# Patient Record
Sex: Male | Born: 1950 | Race: White | Hispanic: No | Marital: Single | Smoking: Never smoker
Health system: Southern US, Community
[De-identification: ages and names within clinical notes are randomized; demographics above are authoritative.]

## PROBLEM LIST (undated history)

## (undated) DIAGNOSIS — D569 Thalassemia, unspecified: Secondary | ICD-10-CM

## (undated) DIAGNOSIS — T7840XA Allergy, unspecified, initial encounter: Secondary | ICD-10-CM

## (undated) DIAGNOSIS — R262 Difficulty in walking, not elsewhere classified: Secondary | ICD-10-CM

## (undated) DIAGNOSIS — M549 Dorsalgia, unspecified: Secondary | ICD-10-CM

## (undated) DIAGNOSIS — R569 Unspecified convulsions: Secondary | ICD-10-CM

## (undated) DIAGNOSIS — K219 Gastro-esophageal reflux disease without esophagitis: Secondary | ICD-10-CM

## (undated) DIAGNOSIS — I69351 Hemiplegia and hemiparesis following cerebral infarction affecting right dominant side: Secondary | ICD-10-CM

## (undated) DIAGNOSIS — E079 Disorder of thyroid, unspecified: Secondary | ICD-10-CM

## (undated) DIAGNOSIS — E039 Hypothyroidism, unspecified: Secondary | ICD-10-CM

## (undated) DIAGNOSIS — D561 Beta thalassemia: Secondary | ICD-10-CM

## (undated) DIAGNOSIS — IMO0001 Reserved for inherently not codable concepts without codable children: Secondary | ICD-10-CM

## (undated) DIAGNOSIS — I639 Cerebral infarction, unspecified: Secondary | ICD-10-CM

## (undated) DIAGNOSIS — I10 Essential (primary) hypertension: Secondary | ICD-10-CM

## (undated) DIAGNOSIS — R296 Repeated falls: Secondary | ICD-10-CM

## (undated) DIAGNOSIS — M1990 Unspecified osteoarthritis, unspecified site: Secondary | ICD-10-CM

## (undated) DIAGNOSIS — H269 Unspecified cataract: Secondary | ICD-10-CM

## (undated) DIAGNOSIS — Z860101 Personal history of adenomatous and serrated colon polyps: Secondary | ICD-10-CM

## (undated) DIAGNOSIS — IMO0002 Reserved for concepts with insufficient information to code with codable children: Secondary | ICD-10-CM

## (undated) DIAGNOSIS — L4050 Arthropathic psoriasis, unspecified: Secondary | ICD-10-CM

## (undated) DIAGNOSIS — L409 Psoriasis, unspecified: Secondary | ICD-10-CM

## (undated) DIAGNOSIS — K5792 Diverticulitis of intestine, part unspecified, without perforation or abscess without bleeding: Secondary | ICD-10-CM

## (undated) DIAGNOSIS — E781 Pure hyperglyceridemia: Secondary | ICD-10-CM

## (undated) DIAGNOSIS — M5412 Radiculopathy, cervical region: Secondary | ICD-10-CM

## (undated) DIAGNOSIS — Z85828 Personal history of other malignant neoplasm of skin: Secondary | ICD-10-CM

## (undated) HISTORY — PX: CORRECTION HAMMER TOE: SUR317

## (undated) HISTORY — DX: Allergy, unspecified, initial encounter: T78.40XA

## (undated) HISTORY — PX: FRACTURE SURGERY: SHX138

## (undated) HISTORY — DX: Gastro-esophageal reflux disease without esophagitis: K21.9

## (undated) HISTORY — DX: Dorsalgia, unspecified: M54.9

## (undated) HISTORY — DX: Cerebral infarction, unspecified: I63.9

## (undated) HISTORY — PX: JOINT REPLACEMENT: SHX530

## (undated) HISTORY — PX: HIP FRACTURE SURGERY: SHX118

## (undated) HISTORY — PX: KNEE ARTHROPLASTY: SHX992

## (undated) HISTORY — DX: Unspecified osteoarthritis, unspecified site: M19.90

## (undated) HISTORY — PX: SPINE SURGERY: SHX786

## (undated) HISTORY — DX: Reserved for concepts with insufficient information to code with codable children: IMO0002

## (undated) HISTORY — DX: Reserved for inherently not codable concepts without codable children: IMO0001

## (undated) HISTORY — PX: CERVICAL FUSION: SHX112

## (undated) HISTORY — DX: Disorder of thyroid, unspecified: E07.9

## (undated) HISTORY — PX: EYE SURGERY: SHX253

## (undated) HISTORY — DX: Unspecified cataract: H26.9

## (undated) HISTORY — PX: BACK SURGERY: SHX140

## (undated) HISTORY — DX: Essential (primary) hypertension: I10

---

## 2004-10-26 ENCOUNTER — Ambulatory Visit: Payer: Self-pay | Admitting: Rheumatology

## 2004-10-28 ENCOUNTER — Encounter: Payer: Self-pay | Admitting: Rheumatology

## 2005-02-03 ENCOUNTER — Encounter: Payer: Self-pay | Admitting: Rheumatology

## 2005-02-25 ENCOUNTER — Ambulatory Visit: Payer: Self-pay | Admitting: General Practice

## 2006-05-24 ENCOUNTER — Ambulatory Visit: Payer: Self-pay | Admitting: General Practice

## 2006-07-04 ENCOUNTER — Ambulatory Visit: Payer: Self-pay | Admitting: General Practice

## 2008-05-31 ENCOUNTER — Ambulatory Visit: Payer: Self-pay | Admitting: Unknown Physician Specialty

## 2008-12-25 ENCOUNTER — Ambulatory Visit: Payer: Self-pay | Admitting: Family Medicine

## 2009-05-12 ENCOUNTER — Ambulatory Visit: Payer: Self-pay | Admitting: General Practice

## 2009-06-04 ENCOUNTER — Inpatient Hospital Stay (HOSPITAL_COMMUNITY): Admission: RE | Admit: 2009-06-04 | Discharge: 2009-06-06 | Payer: Self-pay | Admitting: Neurosurgery

## 2010-06-10 ENCOUNTER — Ambulatory Visit: Payer: Self-pay | Admitting: General Practice

## 2010-06-22 ENCOUNTER — Encounter: Payer: Self-pay | Admitting: General Practice

## 2010-07-06 ENCOUNTER — Encounter: Payer: Self-pay | Admitting: General Practice

## 2011-03-15 LAB — BASIC METABOLIC PANEL
GFR calc non Af Amer: >60 mL/min (ref >60)
Glucose, Bld: 93 mg/dL (ref 70–99)
Potassium: 5.1 mEq/L (ref 3.5–5.1)
Sodium: 139 mEq/L (ref 135–145)

## 2011-03-15 LAB — CBC
HCT: 34.5 % — ABNORMAL LOW (ref 39.0–52.0)
Hemoglobin: 10.7 g/dL — ABNORMAL LOW (ref 13.0–17.0)
RBC: 4.94 MIL/uL (ref 4.22–5.81)
RDW: 21.9 % — ABNORMAL HIGH (ref 11.5–15.5)

## 2011-04-20 NOTE — Op Note (Signed)
Anthony Evans, Anthony Evans                ACCOUNT NO.:  [id number]   MEDICAL RECORD NO.:  [id number]          PATIENT TYPE:  OIB   LOCATION:  3040                         FACILITY:  [facility]   PHYSICIAN:  Hilda Lias, M.D.   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  History of cerebrovascular accident.   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  History of cerebrovascular accident.   PROCEDURE:  A 3-4, 4-5, 5-6 diskectomy with decompression of spinal  cord, decompression of the foramen, interbody fusion with auto and  allograft plate, and microscope.   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  Lately, he has noticed some weakness and  pain in the right upper extremity.  X-rays show severe stenosis at the  level of 3-4, 4-5, 5-6.  He had failed conservative treatment.  The  patient want to proceed with surgery, and the risks were explained to  him including the possibility of no improvement whatsoever.   PROCEDURE:  The patient was taken to the OR and after intubation the  neck was cleaned with DuraPrep.  Longitudinal incision was made through  the skin, subcutaneous tissue, platysma down to the cervical area.  X-  rays subsequently showed that we were at the level of 5-6.  Indeed under  that area, there was almost no space whatsoever.  We brought the  microscope and the drill and we started drilling between the 5-6 level,  removing freely calcified disk.  We were able to get into the posterior  ligament which was opened, and then using the 2- and 3-mm Kerrison punch  decompression of the spinal cord as well as the  foramen was  accomplished.  The same procedure was done at the level of 4-5 and 3-4  with similar finding but less severe than of the level of 5-6.  At the  end, we had good decompression of the 3-4, 4-5, 5-6 area as well as  foraminotomy to decompress the C4, 5, 6 nerve root.  Then, the endplates  were drilled, and three piece of allograft with autograft inside and  their mix was inserted.  This was followed by a plate using 8 screws.  Lateral cervical spine showed there was a good position of the plate and  the graft at the level of 3-4 and 4-5, but we were unable to see at the  level of 5-6, although by direct visualization it was in good position.  Then, the area was irrigated.  Drain was left, and the wound was closed  with Vicryl and Steri-Strips.            ______________________________  Hilda Lias, M.D.    EB/MEDQ  D:  06/04/2009  T:  06/05/2009  Job:  [id number]

## 2011-05-17 ENCOUNTER — Ambulatory Visit: Payer: Self-pay | Admitting: General Practice

## 2011-05-26 ENCOUNTER — Inpatient Hospital Stay: Payer: Self-pay | Admitting: General Practice

## 2013-01-06 DEATH — deceased

## 2013-03-24 ENCOUNTER — Other Ambulatory Visit: Payer: Self-pay | Admitting: Family Medicine

## 2013-04-21 ENCOUNTER — Other Ambulatory Visit: Payer: Self-pay | Admitting: Family Medicine

## 2013-05-11 ENCOUNTER — Ambulatory Visit: Payer: Self-pay | Admitting: Unknown Physician Specialty

## 2014-05-17 ENCOUNTER — Ambulatory Visit (INDEPENDENT_AMBULATORY_CARE_PROVIDER_SITE_OTHER): Payer: BC Managed Care – PPO

## 2014-05-17 ENCOUNTER — Ambulatory Visit (INDEPENDENT_AMBULATORY_CARE_PROVIDER_SITE_OTHER): Payer: BC Managed Care – PPO | Admitting: Podiatry

## 2014-05-17 ENCOUNTER — Encounter: Payer: Self-pay | Admitting: Podiatry

## 2014-05-17 ENCOUNTER — Other Ambulatory Visit: Payer: Self-pay | Admitting: *Deleted

## 2014-05-17 VITALS — BP 141/90 | HR 88 | Resp 16 | Ht 70.0 in | Wt 194.0 lb

## 2014-05-17 DIAGNOSIS — M2040 Other hammer toe(s) (acquired), unspecified foot: Secondary | ICD-10-CM

## 2014-05-17 DIAGNOSIS — L600 Ingrowing nail: Secondary | ICD-10-CM

## 2014-05-17 DIAGNOSIS — M21619 Bunion of unspecified foot: Secondary | ICD-10-CM

## 2014-05-17 NOTE — Patient Instructions (Signed)

## 2014-05-17 NOTE — Progress Notes (Signed)
   Subjective:    Patient ID: Anthony Evans, male    DOB: 1951/10/03, 63 y.o.   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     Review of Systems  Constitutional: Positive for fatigue.  HENT:       SINUS PROBLEMS   Genitourinary: Positive for frequency.  Musculoskeletal: Positive for back pain.       MUSCLE PAIN  JOINT PAIN  DIFFICULTY WALKING   Allergic/Immunologic: Positive for environmental allergies.       Objective:   Physical Exam        Assessment & Plan:

## 2014-05-17 NOTE — Progress Notes (Signed)
Subjective:     Patient ID: Anthony Evans, male   DOB: 29-Aug-1951, 63 y.o.   MRN: [id number]  HPI patient presents stating I have a bad toenail on my right foot and I have toes that are out of position with a history of a perinatal stroke which is made my right side not functioning well   Review of Systems  All other systems reviewed and are negative.      Objective:   Physical Exam  Nursing note and vitals reviewed. Constitutional: He is oriented to person, place, and time.  Cardiovascular: Intact distal pulses.   Musculoskeletal: Normal range of motion.  Neurological: He is oriented to person, place, and time.  Skin: Skin is warm.   neurovascular status intact with atrophy of the right leg secondary to previous stroke and significant loss of motion of the right ankle right midtarsal joint. Significant digital contracture of the lesser digits left over right with severe keratotic lesion distal aspect third toe and abnormal thick painful nail third right     Assessment:     Structural abnormalities secondary to compensation in foot type and nail disease third right this painful when pressed    Plan:     H&P and x-rays reviewed with patient. For the left foot I did debride the distal lesion and applied a buttress pad to take pressure off it and then recommended correction of the third nail right. Patient wants surgery and I explained the procedure and at this time I infiltrated 60 mg Xylocaine Marcaine mixture I then after appropriate cleansing of the area remove the third nail exposed matrix and applied chemical or applications of phenol followed by alcohol lavaged and sterile dressing. Instructed on soaks and will see him back when the left foot become symptomatic

## 2014-05-22 ENCOUNTER — Telehealth: Payer: Self-pay | Admitting: *Deleted

## 2014-05-22 NOTE — Telephone Encounter (Signed)
In there last Friday.  Had a toenail removed.  My job causes me to be on my feet 8 hours a day.  Wondering if he can give me a prescription for Hydrocodone or something like that for the pain?  Call into [facility] on [address].

## 2014-05-23 MED ORDER — HYDROCODONE-ACETAMINOPHEN 10-325 MG PO TABS: 1.0000 | ORAL_TABLET | Freq: Three times a day (TID) | ORAL | Status: DC | PRN

## 2014-05-23 NOTE — Telephone Encounter (Signed)
Had toenail taken off last Friday.  I need something for pain.  I'm on my feet all day.  Call me back.

## 2014-05-23 NOTE — Telephone Encounter (Signed)
Dr Paulla Dolly approved rx for pain meds

## 2014-05-23 NOTE — Telephone Encounter (Signed)
I called and left him a message that Dr. Josephina Shih his prescription for pain medication.  I told him he will have to go by the [facility] office to pick it up.  It can't be called into the pharmacy because it is a narcotic.  Identification will be required to pick it up.

## 2014-07-25 ENCOUNTER — Ambulatory Visit: Payer: Self-pay | Admitting: General Practice

## 2014-10-01 DIAGNOSIS — M1990 Unspecified osteoarthritis, unspecified site: Secondary | ICD-10-CM | POA: Insufficient documentation

## 2014-11-18 ENCOUNTER — Ambulatory Visit (INDEPENDENT_AMBULATORY_CARE_PROVIDER_SITE_OTHER): Payer: BC Managed Care – PPO

## 2014-11-18 ENCOUNTER — Ambulatory Visit (INDEPENDENT_AMBULATORY_CARE_PROVIDER_SITE_OTHER): Payer: BC Managed Care – PPO | Admitting: Podiatry

## 2014-11-18 VITALS — BP 137/84 | HR 101 | Resp 16

## 2014-11-18 DIAGNOSIS — Q828 Other specified congenital malformations of skin: Secondary | ICD-10-CM

## 2014-11-18 DIAGNOSIS — M2042 Other hammer toe(s) (acquired), left foot: Secondary | ICD-10-CM

## 2014-11-18 NOTE — Progress Notes (Signed)
He presents today to complaint of painful lesion to the plantar aspect of the second third toes of the left foot in the plantar left foot.  Objective: Pulses remain unchanged hammertoe deformities resulting in a distal clavus of a porokeratosis plantar aspect of the left foot.  Assessment: Porokeratosis distal clavus.  Plan: Debrided all reactive hyperkeratosis today.

## 2015-03-13 DIAGNOSIS — M5412 Radiculopathy, cervical region: Secondary | ICD-10-CM | POA: Insufficient documentation

## 2015-03-17 ENCOUNTER — Ambulatory Visit: Payer: Self-pay | Admitting: Podiatry

## 2015-03-19 ENCOUNTER — Ambulatory Visit (INDEPENDENT_AMBULATORY_CARE_PROVIDER_SITE_OTHER): Payer: BLUE CROSS/BLUE SHIELD | Admitting: Podiatry

## 2015-03-19 ENCOUNTER — Encounter: Payer: Self-pay | Admitting: Podiatry

## 2015-03-19 VITALS — BP 130/83 | HR 79 | Resp 16

## 2015-03-19 DIAGNOSIS — B351 Tinea unguium: Secondary | ICD-10-CM | POA: Diagnosis not present

## 2015-03-19 DIAGNOSIS — M79606 Pain in leg, unspecified: Secondary | ICD-10-CM | POA: Diagnosis not present

## 2015-03-19 DIAGNOSIS — Q828 Other specified congenital malformations of skin: Secondary | ICD-10-CM | POA: Diagnosis not present

## 2015-03-19 DIAGNOSIS — M2042 Other hammer toe(s) (acquired), left foot: Secondary | ICD-10-CM

## 2015-03-19 NOTE — Progress Notes (Signed)
He presents today with chief complaint of pain to his toenails his hammertoe deformities of his left foot and calluses. He states that he would like to have these surgically corrected if possible.  Objective: Vital signs are stable he is alert and oriented 3. Pulses are strongly palpable bilateral. Moderate to severe hammertoe deformity rigid nature toes 2 through 5 of the left foot. Distal clavus to the second digit of the left foot strain with painful on palpation. His nails are thick yellow dystrophic mycotic and painful palpation.  Assessment: Pain in limb secondary to hammertoe deformities distal clavus and onychomycosis.  Plan: Debridement of all reactive hyperkeratosis and nails 1 through 5 bilateral. We discussed the etiology pathology conservative versus surgical therapies. At this point he would like to have his toes surgically corrected and that arthrodesis will be performed at the PIPJ's to straighten the second third and fourth toes of the left foot. The fifth toe will have a derotational arthroplasty performed. We went over the consent form today line by line number by number giving him ample time to ask questions he saw fit regarding these procedures. I answered them to the best of my ability. He does understand and we went over thoroughly the possible complications which may include but are not limited to postop pain bleeding swelling infection recurrence overcorrection under correction need for further surgery possible loss of digit also limb and loss of life. He signed all 3 pages of the consent form and I will follow-up with him in the near future for surgical correction.

## 2015-03-19 NOTE — Patient Instructions (Signed)
Pre-Operative Instructions  Congratulations, you have decided to take an important step to improving your quality of life.  You can be assured that the doctors of [facility] will be with you every step of the way.  1. Plan to be at the surgery center/hospital at least 1 (one) hour prior to your scheduled time unless otherwise directed by the surgical center/hospital staff.  You must have a responsible adult accompany you, remain during the surgery and drive you home.  Make sure you have directions to the surgical center/hospital and know how to get there on time. 2. For hospital based surgery you will need to obtain a history and physical form from your family physician within 1 month prior to the date of surgery- we will give you a form for you primary physician.  3. We make every effort to accommodate the date you request for surgery.  There are however, times where surgery dates or times have to be moved.  We will contact you as soon as possible if a change in schedule is required.   4. No Aspirin/Ibuprofen for one week before surgery.  If you are on aspirin, any non-steroidal anti-inflammatory medications (Mobic, Aleve, Ibuprofen) you should stop taking it 7 days prior to your surgery.  You make take Tylenol  For pain prior to surgery.  5. Medications- If you are taking daily heart and blood pressure medications, seizure, reflux, allergy, asthma, anxiety, pain or diabetes medications, make sure the surgery center/hospital is aware before the day of surgery so they may notify you which medications to take or avoid the day of surgery. 6. No food or drink after midnight the night before surgery unless directed otherwise by surgical center/hospital staff. 7. No alcoholic beverages 24 hours prior to surgery.  No smoking 24 hours prior to or 24 hours after surgery. 8. Wear loose pants or shorts- loose enough to fit over bandages, boots, and casts. 9. No slip on shoes, sneakers are best. 10. Bring  your boot with you to the surgery center/hospital.  Also bring crutches or a walker if your physician has prescribed it for you.  If you do not have this equipment, it will be provided for you after surgery. 11. If you have not been contracted by the surgery center/hospital by the day before your surgery, call to confirm the date and time of your surgery. 12. Leave-time from work may vary depending on the type of surgery you have.  Appropriate arrangements should be made prior to surgery with your employer. 13. Prescriptions will be provided immediately following surgery by your doctor.  Have these filled as soon as possible after surgery and take the medication as directed. 14. Remove nail polish on the operative foot. 15. Wash the night before surgery.  The night before surgery wash the foot and leg well with the antibacterial soap provided and water paying special attention to beneath the toenails and in between the toes.  Rinse thoroughly with water and dry well with a towel.  Perform this wash unless told not to do so by your physician.  Enclosed: 1 Ice pack (please put in freezer the night before surgery)   1 Hibiclens skin cleaner   Pre-op Instructions  If you have any questions regarding the instructions, do not hesitate to call our office.  [place]: [address] [place], [place] [address] [phone]  [place]: [address] [place], [place] [address] [phone]  [place]: [address]  [place], [place] [address] [phone]  Dr. Richard   Tuchman DPM, Dr. Norman Regal DPM Dr. Richard Sikora DPM, Dr. M. Todd Sladen Plancarte DPM, Dr. Kathryn Egerton DPM 

## 2015-03-26 ENCOUNTER — Other Ambulatory Visit: Payer: Self-pay | Admitting: Podiatry

## 2015-03-26 ENCOUNTER — Encounter: Payer: BLUE CROSS/BLUE SHIELD | Admitting: Podiatry

## 2015-03-26 MED ORDER — OXYCODONE-ACETAMINOPHEN 10-325 MG PO TABS: ORAL_TABLET | ORAL | Status: DC

## 2015-03-26 MED ORDER — PROMETHAZINE HCL 25 MG PO TABS: 25.0000 mg | ORAL_TABLET | Freq: Three times a day (TID) | ORAL | Status: DC | PRN

## 2015-03-26 MED ORDER — CEPHALEXIN 500 MG PO CAPS: 500.0000 mg | ORAL_CAPSULE | Freq: Two times a day (BID) | ORAL | Status: DC

## 2015-03-28 ENCOUNTER — Encounter: Payer: Self-pay | Admitting: Podiatry

## 2015-03-28 DIAGNOSIS — M2042 Other hammer toe(s) (acquired), left foot: Secondary | ICD-10-CM | POA: Diagnosis not present

## 2015-04-02 ENCOUNTER — Ambulatory Visit (INDEPENDENT_AMBULATORY_CARE_PROVIDER_SITE_OTHER): Payer: BLUE CROSS/BLUE SHIELD | Admitting: Podiatry

## 2015-04-02 ENCOUNTER — Encounter: Payer: Self-pay | Admitting: Podiatry

## 2015-04-02 ENCOUNTER — Ambulatory Visit (INDEPENDENT_AMBULATORY_CARE_PROVIDER_SITE_OTHER): Payer: BLUE CROSS/BLUE SHIELD

## 2015-04-02 DIAGNOSIS — Z9889 Other specified postprocedural states: Secondary | ICD-10-CM

## 2015-04-02 DIAGNOSIS — M2042 Other hammer toe(s) (acquired), left foot: Secondary | ICD-10-CM

## 2015-04-02 NOTE — Progress Notes (Signed)
He presents today 1 week status post hammertoe repair #2 #3 #4 #5 of the left foot. He states that he still continues to take the narcotic because of the pain. He is very happy with the outcome.  Objective: Vital signs are stable he is alert and oriented 3. Pulses are palpable left foot. Dry sterile dressing was removed and the straight swelling surgical toes 2 through 5 left. Radiographs confirmed good placement of screws and arthrodesis of the PIPJ's.  Assessment: Well-healing surgical foot.  Plan: Redressed with a dry sterile compressive dressing follow-up with him in 1 week at which time we may consider suture removal.

## 2015-04-09 ENCOUNTER — Encounter: Payer: Self-pay | Admitting: Podiatry

## 2015-04-09 ENCOUNTER — Ambulatory Visit (INDEPENDENT_AMBULATORY_CARE_PROVIDER_SITE_OTHER): Payer: BLUE CROSS/BLUE SHIELD | Admitting: Podiatry

## 2015-04-09 DIAGNOSIS — Z9889 Other specified postprocedural states: Secondary | ICD-10-CM

## 2015-04-09 MED ORDER — OXYCODONE-ACETAMINOPHEN 10-325 MG PO TABS: ORAL_TABLET | ORAL | Status: DC

## 2015-04-09 NOTE — Progress Notes (Signed)
He presents today for his second postop visit regarding hammertoe repair with screw fixation date of surgery was 03/28/2015. He states that yesterday he took a stumble while at the doctor's office catching issue but never actually falling. He states that the toes are somewhat tender today.  Objective: Vital signs are stable he is alert and oriented 3 dress her dressing of the left foot was removed demonstrating hammertoe repairs of toes #2 #3 #4 and #5 of the left foot. Edges are intact margins well coapted there is no erythema is mild edema no saline as drainage or odor.  Assessment: Well-healing surgical toes.  Plan: Sutures were removed today and Coban was wrapped around each toe individually. He will continue use of the Darco shoe for at least 2 more weeks at which time radiographs will be taken. I will allow him to start washing this foot now.

## 2015-04-10 ENCOUNTER — Telehealth: Payer: Self-pay | Admitting: Podiatry

## 2015-04-10 NOTE — Telephone Encounter (Signed)
Patient called wanting to know the status of his short term disability paperwork that he dropped off about 2 weeks ago before or after he had surgery. Requested a call back today.

## 2015-04-10 NOTE — Telephone Encounter (Signed)
Pt called back and has a fax number for his FMLA to be sent to. The number is [phone] attention Donnelly Stager.If any question you can call the pt back at his home.

## 2015-04-10 NOTE — Telephone Encounter (Signed)
CALLED PATIENT REGARDING DISABILITY PAPERWORK TOLD HIM IT WAS MAILED OUT

## 2015-04-15 NOTE — Progress Notes (Signed)
DOS 03/28/2015 Hammertoe repair 2,3,4,5 left foot

## 2015-04-23 ENCOUNTER — Ambulatory Visit (INDEPENDENT_AMBULATORY_CARE_PROVIDER_SITE_OTHER): Payer: BLUE CROSS/BLUE SHIELD | Admitting: Podiatry

## 2015-04-23 ENCOUNTER — Ambulatory Visit (INDEPENDENT_AMBULATORY_CARE_PROVIDER_SITE_OTHER): Payer: BLUE CROSS/BLUE SHIELD

## 2015-04-23 VITALS — BP 120/80 | HR 80 | Resp 16

## 2015-04-23 DIAGNOSIS — Z9889 Other specified postprocedural states: Secondary | ICD-10-CM

## 2015-04-23 DIAGNOSIS — M2042 Other hammer toe(s) (acquired), left foot: Secondary | ICD-10-CM | POA: Diagnosis not present

## 2015-04-23 NOTE — Progress Notes (Signed)
He presents today for follow-up of his hammertoe repair toes #2 #3 #4 #5 of the left foot. Date of surgery was 03/28/2015. He states that he is doing very well but he cannot get these toes into a regular shoe as of yet.  Objective: Vital signs are stable he's alert and oriented 3 presents today with his Darco shoe. Minimal edema to his toes radiographs didn't stray well-healing surgical foot left.  Assessment: Well-healing surgical foot left status post 5 weeks.  Plan: Follow up with me in 2 weeks at which time we will plan to release if possible.

## 2015-05-06 ENCOUNTER — Encounter
Admission: RE | Admit: 2015-05-06 | Discharge: 2015-05-06 | Disposition: A | Discharge status: Home / Self Care | Payer: BLUE CROSS/BLUE SHIELD | Source: Ambulatory Visit | Attending: Ophthalmology | Admitting: Ophthalmology

## 2015-05-06 DIAGNOSIS — H2511 Age-related nuclear cataract, right eye: Secondary | ICD-10-CM | POA: Insufficient documentation

## 2015-05-06 DIAGNOSIS — Z01812 Encounter for preprocedural laboratory examination: Secondary | ICD-10-CM | POA: Insufficient documentation

## 2015-05-06 LAB — POTASSIUM: POTASSIUM: 3.7 mmol/L (ref 3.5–5.1)

## 2015-05-07 ENCOUNTER — Ambulatory Visit (INDEPENDENT_AMBULATORY_CARE_PROVIDER_SITE_OTHER): Payer: BLUE CROSS/BLUE SHIELD

## 2015-05-07 ENCOUNTER — Encounter: Payer: Self-pay | Admitting: Podiatry

## 2015-05-07 ENCOUNTER — Ambulatory Visit (INDEPENDENT_AMBULATORY_CARE_PROVIDER_SITE_OTHER): Payer: BLUE CROSS/BLUE SHIELD | Admitting: Podiatry

## 2015-05-07 DIAGNOSIS — M2042 Other hammer toe(s) (acquired), left foot: Secondary | ICD-10-CM

## 2015-05-07 DIAGNOSIS — Z9889 Other specified postprocedural states: Secondary | ICD-10-CM

## 2015-05-07 NOTE — Progress Notes (Signed)
He presents today status post hammertoe repair 2 through 5 left foot. He states that he is doing wonderfully and is very excited about the way his feet are healing.  Objective: Signs are stable he is alert and oriented 3 minimal edema to any of his toes with exception of maybe the fifth toe. A mild edema about the fifth digit left foot is present. Radiographic evaluation x-rays well-healing surgical arthrodeses left foot.  Plan: I will follow-up with him in 1 month I will allow him to wear his regular shoe gear.

## 2015-05-12 ENCOUNTER — Encounter: Payer: Self-pay | Admitting: *Deleted

## 2015-05-12 DIAGNOSIS — K219 Gastro-esophageal reflux disease without esophagitis: Secondary | ICD-10-CM | POA: Diagnosis not present

## 2015-05-12 DIAGNOSIS — I10 Essential (primary) hypertension: Secondary | ICD-10-CM | POA: Diagnosis not present

## 2015-05-12 DIAGNOSIS — H2512 Age-related nuclear cataract, left eye: Secondary | ICD-10-CM | POA: Diagnosis not present

## 2015-05-12 DIAGNOSIS — Z91048 Other nonmedicinal substance allergy status: Secondary | ICD-10-CM | POA: Diagnosis not present

## 2015-05-12 DIAGNOSIS — Z79899 Other long term (current) drug therapy: Secondary | ICD-10-CM | POA: Diagnosis not present

## 2015-05-12 DIAGNOSIS — D569 Thalassemia, unspecified: Secondary | ICD-10-CM | POA: Diagnosis not present

## 2015-05-12 DIAGNOSIS — M1990 Unspecified osteoarthritis, unspecified site: Secondary | ICD-10-CM | POA: Diagnosis not present

## 2015-05-12 DIAGNOSIS — Z91040 Latex allergy status: Secondary | ICD-10-CM | POA: Diagnosis not present

## 2015-05-12 DIAGNOSIS — K5790 Diverticulosis of intestine, part unspecified, without perforation or abscess without bleeding: Secondary | ICD-10-CM | POA: Diagnosis not present

## 2015-05-14 LAB — MISC LABCORP TEST (SEND OUT): Labcorp test code: 602669

## 2015-05-14 NOTE — OR Nursing (Signed)
Anthony Evans in or notified Latex rast negative. Eye center has results

## 2015-05-15 ENCOUNTER — Ambulatory Visit: Payer: BLUE CROSS/BLUE SHIELD | Admitting: Anesthesiology

## 2015-05-15 ENCOUNTER — Encounter: Payer: Self-pay | Admitting: *Deleted

## 2015-05-15 ENCOUNTER — Ambulatory Visit
Admission: RE | Admit: 2015-05-15 | Discharge: 2015-05-15 | Disposition: A | Discharge status: Home / Self Care | Payer: BLUE CROSS/BLUE SHIELD | Source: Ambulatory Visit | Attending: Ophthalmology | Admitting: Ophthalmology

## 2015-05-15 ENCOUNTER — Encounter
Admission: RE | Disposition: A | Discharge status: Home / Self Care | Payer: Self-pay | Source: Ambulatory Visit | Attending: Ophthalmology

## 2015-05-15 DIAGNOSIS — I10 Essential (primary) hypertension: Secondary | ICD-10-CM | POA: Insufficient documentation

## 2015-05-15 DIAGNOSIS — M1990 Unspecified osteoarthritis, unspecified site: Secondary | ICD-10-CM | POA: Insufficient documentation

## 2015-05-15 DIAGNOSIS — Z79899 Other long term (current) drug therapy: Secondary | ICD-10-CM | POA: Insufficient documentation

## 2015-05-15 DIAGNOSIS — D569 Thalassemia, unspecified: Secondary | ICD-10-CM | POA: Insufficient documentation

## 2015-05-15 DIAGNOSIS — H2512 Age-related nuclear cataract, left eye: Secondary | ICD-10-CM | POA: Insufficient documentation

## 2015-05-15 DIAGNOSIS — Z91040 Latex allergy status: Secondary | ICD-10-CM | POA: Insufficient documentation

## 2015-05-15 DIAGNOSIS — K5790 Diverticulosis of intestine, part unspecified, without perforation or abscess without bleeding: Secondary | ICD-10-CM | POA: Insufficient documentation

## 2015-05-15 DIAGNOSIS — K219 Gastro-esophageal reflux disease without esophagitis: Secondary | ICD-10-CM | POA: Insufficient documentation

## 2015-05-15 DIAGNOSIS — Z91048 Other nonmedicinal substance allergy status: Secondary | ICD-10-CM | POA: Insufficient documentation

## 2015-05-15 HISTORY — DX: Unspecified convulsions: R56.9

## 2015-05-15 HISTORY — PX: CATARACT EXTRACTION W/PHACO: SHX586

## 2015-05-15 HISTORY — DX: Gastro-esophageal reflux disease without esophagitis: K21.9

## 2015-05-15 HISTORY — DX: Thalassemia, unspecified: D56.9

## 2015-05-15 SURGERY — PHACOEMULSIFICATION, CATARACT, WITH IOL INSERTION
Anesthesia: Monitor Anesthesia Care | Laterality: Left | Wound class: Clean

## 2015-05-15 MED ORDER — BSS IO SOLN
INTRAOCULAR | Status: DC | PRN
  Administered 2015-05-15: 300 mL

## 2015-05-15 MED ORDER — TETRACAINE HCL 0.5 % OP SOLN
1.0000 [drp] | OPHTHALMIC | Status: DC | PRN
Start: 2015-05-15 — End: 2015-05-15
  Administered 2015-05-15: 1 [drp] via OPHTHALMIC

## 2015-05-15 MED ORDER — MOXIFLOXACIN HCL 0.5 % OP SOLN
1.0000 [drp] | OPHTHALMIC | Status: DC
  Administered 2015-05-15 (×2): 1 [drp] via OPHTHALMIC

## 2015-05-15 MED ORDER — MOXIFLOXACIN HCL 0.5 % OP SOLN
OPHTHALMIC | Status: DC | PRN
  Administered 2015-05-15: 1 [drp] via OPHTHALMIC

## 2015-05-15 MED ORDER — CEFUROXIME OPHTHALMIC INJECTION 1 MG/0.1 ML
INJECTION | OPHTHALMIC | Status: AC
  Filled 2015-05-15: qty 0.1

## 2015-05-15 MED ORDER — EPINEPHRINE HCL 1 MG/ML IJ SOLN
INTRAMUSCULAR | Status: AC
  Filled 2015-05-15: qty 1

## 2015-05-15 MED ORDER — ACETAZOLAMIDE SODIUM 500 MG IJ SOLR
INTRAMUSCULAR | Status: AC
  Filled 2015-05-15: qty 500

## 2015-05-15 MED ORDER — MIDAZOLAM HCL 2 MG/2ML IJ SOLN
INTRAMUSCULAR | Status: DC | PRN
  Administered 2015-05-15: 0.5 mg via INTRAVENOUS
  Administered 2015-05-15: .5 mg via INTRAVENOUS
  Administered 2015-05-15: 1 mg via INTRAVENOUS

## 2015-05-15 MED ORDER — MOXIFLOXACIN HCL 0.5 % OP SOLN
OPHTHALMIC | Status: AC
  Administered 2015-05-15: 1 [drp] via OPHTHALMIC
  Filled 2015-05-15: qty 3

## 2015-05-15 MED ORDER — PHENYLEPHRINE HCL 10 % OP SOLN
OPHTHALMIC | Status: AC
  Filled 2015-05-15: qty 5

## 2015-05-15 MED ORDER — NA HYALUR & NA CHOND-NA HYALUR 0.55-0.5 ML IO KIT
PACK | INTRAOCULAR | Status: AC
  Filled 2015-05-15: qty 1.05

## 2015-05-15 MED ORDER — NA HYALUR & NA CHOND-NA HYALUR 0.4-0.35 ML IO KIT
PACK | INTRAOCULAR | Status: DC | PRN
  Administered 2015-05-15: .75 mL via INTRAOCULAR

## 2015-05-15 MED ORDER — CARBACHOL 0.01 % IO SOLN
INTRAOCULAR | Status: DC | PRN
  Administered 2015-05-15: 1.5 mL via INTRAOCULAR

## 2015-05-15 MED ORDER — NA CHONDROIT SULF-NA HYALURON 40-30 MG/ML IO SOLN
INTRAOCULAR | Status: DC | PRN
  Administered 2015-05-15: 0.5 mL via INTRAOCULAR

## 2015-05-15 MED ORDER — LIDOCAINE HCL (PF) 4 % IJ SOLN
INTRAMUSCULAR | Status: AC
  Filled 2015-05-15: qty 5

## 2015-05-15 MED ORDER — PHENYLEPHRINE HCL 10 % OP SOLN
1.0000 [drp] | OPHTHALMIC | Status: AC
  Administered 2015-05-15 (×4): 1 [drp] via OPHTHALMIC

## 2015-05-15 MED ORDER — ACETAZOLAMIDE 125 MG PO TABS: 250.0000 mg | ORAL_TABLET | Freq: Two times a day (BID) | ORAL | Status: DC

## 2015-05-15 MED ORDER — CYCLOPENTOLATE HCL 2 % OP SOLN
OPHTHALMIC | Status: AC
  Filled 2015-05-15: qty 2

## 2015-05-15 MED ORDER — NA CHONDROIT SULF-NA HYALURON 40-30 MG/ML IO SOLN
INTRAOCULAR | Status: AC
  Filled 2015-05-15: qty 1

## 2015-05-15 MED ORDER — CYCLOPENTOLATE HCL 2 % OP SOLN
1.0000 [drp] | OPHTHALMIC | Status: AC
  Administered 2015-05-15 (×4): 1 [drp] via OPHTHALMIC

## 2015-05-15 MED ORDER — TETRACAINE HCL 0.5 % OP SOLN
OPHTHALMIC | Status: AC
  Administered 2015-05-15: 1 [drp] via OPHTHALMIC
  Filled 2015-05-15: qty 2

## 2015-05-15 MED ORDER — LIDOCAINE HCL (PF) 1 % IJ SOLN
INTRAOCULAR | Status: DC | PRN
  Administered 2015-05-15: 4 mL via OPHTHALMIC

## 2015-05-15 MED ORDER — FENTANYL CITRATE (PF) 100 MCG/2ML IJ SOLN
INTRAMUSCULAR | Status: DC | PRN
  Administered 2015-05-15 (×2): 25 ug via INTRAVENOUS
  Administered 2015-05-15: 50 ug via INTRAVENOUS

## 2015-05-15 MED ORDER — CEFUROXIME OPHTHALMIC INJECTION 1 MG/0.1 ML
INJECTION | OPHTHALMIC | Status: DC | PRN
  Administered 2015-05-15: 0.1 mL via INTRACAMERAL

## 2015-05-15 MED ORDER — MOXIFLOXACIN HCL 0.5 % OP SOLN
1.0000 [drp] | OPHTHALMIC | Status: DC | PRN
  Administered 2015-05-15: 1 [drp] via OPHTHALMIC

## 2015-05-15 MED ORDER — SODIUM CHLORIDE 0.9 % IV SOLN
INTRAVENOUS | Status: DC
  Administered 2015-05-15: 11:00:00 via INTRAVENOUS
  Administered 2015-05-15: 50 mL/h via INTRAVENOUS

## 2015-05-15 SURGICAL SUPPLY — 27 items
BSS 15 ML ×6 IMPLANT
CUP MEDICINE 2OZ PLAST GRAD ST (MISCELLANEOUS) ×3 IMPLANT
FILTER MILLEX .045 (MISCELLANEOUS) ×1 IMPLANT
FLTR MILLEX .045 (MISCELLANEOUS) ×3
GLOVE BIO SURGEON STRL SZ7 (GLOVE) ×3 IMPLANT
GLOVE SURG LX 6.5 MICRO (GLOVE) ×4
GLOVE SURG LX STRL 6.5 MICRO (GLOVE) ×2 IMPLANT
GOWN STRL REUS W/ TWL LRG LVL3 (GOWN DISPOSABLE) ×2 IMPLANT
GOWN STRL REUS W/TWL LRG LVL3 (GOWN DISPOSABLE) ×4
LENS IOL ACRSF MP 19.0 (Intraocular Lens) ×1 IMPLANT
LENS IOL ACRYSOF POST 19.0 (Intraocular Lens) ×3 IMPLANT
LENS IOL POST 20.0 (Intraocular Lens) IMPLANT
NEEDLE FILTER BLUNT 18X 1/2SAF (NEEDLE) ×2
NEEDLE FILTER BLUNT 18X1 1/2 (NEEDLE) ×1 IMPLANT
PACK CATARACT (MISCELLANEOUS) ×3 IMPLANT
PACK CATARACT BRASINGTON LX (MISCELLANEOUS) ×3 IMPLANT
PACK EYE AFTER SURG (MISCELLANEOUS) ×3 IMPLANT
SOL BSS BAG (MISCELLANEOUS) ×3
SOL PREP PVP 2OZ (MISCELLANEOUS) ×3
SOLUTION BSS BAG (MISCELLANEOUS) ×1 IMPLANT
SOLUTION PREP PVP 2OZ (MISCELLANEOUS) ×1 IMPLANT
SUT ETHILON 10 0 CS140 6 (SUTURE) ×3 IMPLANT
SYR 3ML LL SCALE MARK (SYRINGE) ×6 IMPLANT
SYR TB 1ML 27GX1/2 LL (SYRINGE) ×3 IMPLANT
WATER STERILE IRR 1000ML POUR (IV SOLUTION) ×3 IMPLANT
WIPE NON LINTING 3.25X3.25 (MISCELLANEOUS) ×3 IMPLANT
anterir chamber IOL ×3 IMPLANT

## 2015-05-15 NOTE — OR Nursing (Signed)
Cassette Lot#: [id number]

## 2015-05-15 NOTE — Anesthesia Preprocedure Evaluation (Addendum)
Anesthesia Evaluation  Patient identified by MRN, date of birth, ID band  Reviewed: Allergy & Precautions, NPO status , Patient's Chart, lab work & pertinent test results  Airway Mallampati: II  TM Distance: >3 FB Neck ROM: Full    Dental   Pulmonary          Cardiovascular hypertension, Pt. 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, r sided weakness), Residual Symptoms    GI/Hepatic GERD- (no meds x 1 week)  ,  Endo/Other  Hypothyroidism   Renal/GU      Musculoskeletal   Abdominal   Peds  Hematology  (+) anemia ,   Anesthesia Other Findings   Reproductive/Obstetrics                            Anesthesia Physical Anesthesia Plan  ASA: III  Anesthesia Plan: MAC   Post-op Pain Management:    Induction: Intravenous  Airway Management Planned:   Additional Equipment:   Intra-op Plan:   Post-operative Plan:   Informed Consent: I have reviewed the patients History and Physical, chart, labs and discussed the procedure including the risks, benefits and alternatives for the proposed anesthesia with the patient or authorized representative who has indicated his/her understanding and acceptance.     Plan Discussed with:   Anesthesia Plan Comments:         Anesthesia Quick Evaluation

## 2015-05-15 NOTE — Anesthesia Procedure Notes (Signed)
Procedure Name: MAC Performed by: Christe Tellez, Joelene Millin Pre-anesthesia Checklist: Patient identified, Emergency Drugs available, Suction available, Patient being monitored and Timeout performed Oxygen Delivery Method: Nasal cannula

## 2015-05-15 NOTE — H&P (Signed)
The history and physical was faxed to the hospital. The history and physical was reviewed by me and no changes have occurred.   

## 2015-05-15 NOTE — Op Note (Addendum)
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  Informed consent was obtained. A dilated fundus exam was performed within 6 months.   The patient was premedicated and brought to the operating room and placed on the operating table in the supine position.  Topical tetracaine was instilled.  After adequate anesthesia, the patient was prepped and draped in the usual fashion.  A wire lid speculum was inserted and the microscope was positioned.  A sideport was used to create a paracentesis site and a mixture of preservative-free lidocaine, BSS, and epinephrine was was instilled into the anterior chamber, followed by viscoelastic.  A clear corneal incision was created using a keratome blade.  Capsulorrhexis was then performed.  In situ phacoemulsification was performed.  Cortical material was removed with the irrigation-aspiration unit.  Viscoelastic was instilled to open the capsular bag.  At this point, a small rent in the posterior capsule was noted. No vitreous was noted in the anterior chamber. After the main wound was enlarged, a 3-piece intraocular lens, model MA60AC 19.0 diopters, was inserted and positioned.  Irrigation-aspiration was used to remove all viscoelastic. Intracameral cefuroxime was injected into the eye. Miostat was injected into the eye and the pupil was found to be  equally round with no peaking.The main wound was closed with two 10-0 nylon sutures. Wounds were checked for leakage and confirmed to be secure.  Lid speculum was removed and a shield was placed over the eye.  Patient was returned to the recovery room in stable condition.  IMPLANTS:   Implant Name Type Inv. Item Serial No. Manufacturer Lot No. LRB No. Used  LENS IOL POST 20.0 - [id number] Intraocular Lens LENS IOL POST 20.0 [id number] ALCON  Left 1  anterir chamber IOL     [id number] ALCON   Left 1     Postoperative care and discharge medication counseling was discussed with the patient or the parents prior to discharge

## 2015-05-15 NOTE — Discharge Instructions (Signed)
POST OPERATIVE INSTRUCTIONS  DAY OF CATARACT SURGERY Your surgery went well.  Today, take it easy and protect the eye.  Dont bend over at the waist. Dont lift objects heavier than a jug of milk (10lbs). Dont let anything get in the eye other than the drops we give you. Keep the eye shield on at all times except to put in the drops. You may have a mild headache, soreness or scratchy sensation after surgery. EYE DROPS AFTER SURGERY          Durezol Steroid (SHAKE WELL) Ilevro Anti-inflammatory Vigamox Antibiotic       2 times a day Once daily 4 times a day      Wait 5 minutes between drops  If you have questions, call [facility]  Also take 250 mg of Acetazolamide tonight before bedtime  We will see you tomorrow in the eye clinic.

## 2015-05-15 NOTE — Anesthesia Postprocedure Evaluation (Signed)
  Anesthesia Post-op Note  Patient: Anthony Evans  Procedure(s) Performed: Procedure(s) with comments: CATARACT EXTRACTION PHACO AND INTRAOCULAR LENS PLACEMENT (IOC) (Left) - [place]: 2:40.9 AP: 17.3 CDE: 27.88  Anesthesia type:MAC  Patient location: PACU  Post pain: Pain level controlled  Post assessment: Post-op Vital signs reviewed, Patient's Cardiovascular Status Stable, Respiratory Function Stable, Patent Airway and No signs of Nausea or vomiting  Post vital signs: Reviewed and stable  Last Vitals:  Filed Vitals:   05/15/15 1153  BP: 123/79  Pulse: 79  Temp: 36.4 C  Resp: 16    Level of consciousness: awake, alert  and patient cooperative  Complications: No apparent anesthesia complications

## 2015-05-15 NOTE — Transfer of Care (Signed)
Immediate Anesthesia Transfer of Care Note  Patient: Anthony Evans  Procedure(s) Performed: Procedure(s) with comments: CATARACT EXTRACTION PHACO AND INTRAOCULAR LENS PLACEMENT (IOC) (Left) - [place]: 2:40.9 AP: 17.3 CDE: 27.88  Patient Location: PACU  Anesthesia Type:MAC  Level of Consciousness: sedated  Airway & Oxygen Therapy: Patient Spontanous Breathing  Post-op Assessment: Report given to RN and Post -op Vital signs reviewed and stable  Post vital signs: Reviewed and stable  Last Vitals:  Filed Vitals:   05/15/15 1153  BP: 123/79  Pulse: 79  Temp: 36.4 C  Resp: 16    Complications: No apparent anesthesia complications

## 2015-05-23 ENCOUNTER — Encounter: Payer: Self-pay | Admitting: *Deleted

## 2015-05-23 NOTE — Progress Notes (Signed)
Disability paperwork completed and mailed with chart notes to:  [employer] [facility] [address] [place] [address]

## 2015-05-27 ENCOUNTER — Telehealth: Payer: Self-pay | Admitting: *Deleted

## 2015-05-27 NOTE — Telephone Encounter (Signed)
Pt states his short-term disability has been cut, contact [employer] for more informations please.

## 2015-05-28 NOTE — Telephone Encounter (Signed)
This information was sent in on Monday (6/20). Patient has been notified.

## 2015-06-04 ENCOUNTER — Ambulatory Visit (INDEPENDENT_AMBULATORY_CARE_PROVIDER_SITE_OTHER): Payer: BLUE CROSS/BLUE SHIELD | Admitting: Podiatry

## 2015-06-04 ENCOUNTER — Encounter: Payer: Self-pay | Admitting: Podiatry

## 2015-06-04 ENCOUNTER — Ambulatory Visit (INDEPENDENT_AMBULATORY_CARE_PROVIDER_SITE_OTHER): Payer: BLUE CROSS/BLUE SHIELD

## 2015-06-04 DIAGNOSIS — Z9889 Other specified postprocedural states: Secondary | ICD-10-CM

## 2015-06-04 DIAGNOSIS — M2042 Other hammer toe(s) (acquired), left foot: Secondary | ICD-10-CM

## 2015-06-05 NOTE — Progress Notes (Signed)
He presents today for follow-up of his hammertoe repairs toes 2 through 5 of the left foot. Date of surgery 03/28/2015. He states that they seem to be doing pretty good and is ready to go back to work.  Objective: Vital signs are stable he is alert and oriented 3. Pulses are palpable left foot. Radiographs confirm well-healing arthrodeses toes #2 #3 and #4 with arthroplasty fifth digit left. Mild swelling about the fifth digit of the left foot minimal swelling to the second third and fourth toes of the left foot.  Assessment: Well-healing surgical foot left.  Plan: I'm going to recommend that he get back to work and follow up with me in approximately one month to 6 weeks.

## 2015-06-07 IMAGING — CR RIGHT HIP - COMPLETE 2+ VIEW
1 series · 3 of 3 positions shown · non-contrast
Comparison: None.

CLINICAL DATA: Pain post trauma

EXAM:
RIGHT HIP - COMPLETE 2+ VIEW

[Series 1: ap · 0.17mm/px · 3 of 3 slices shown]
[im 1/3]
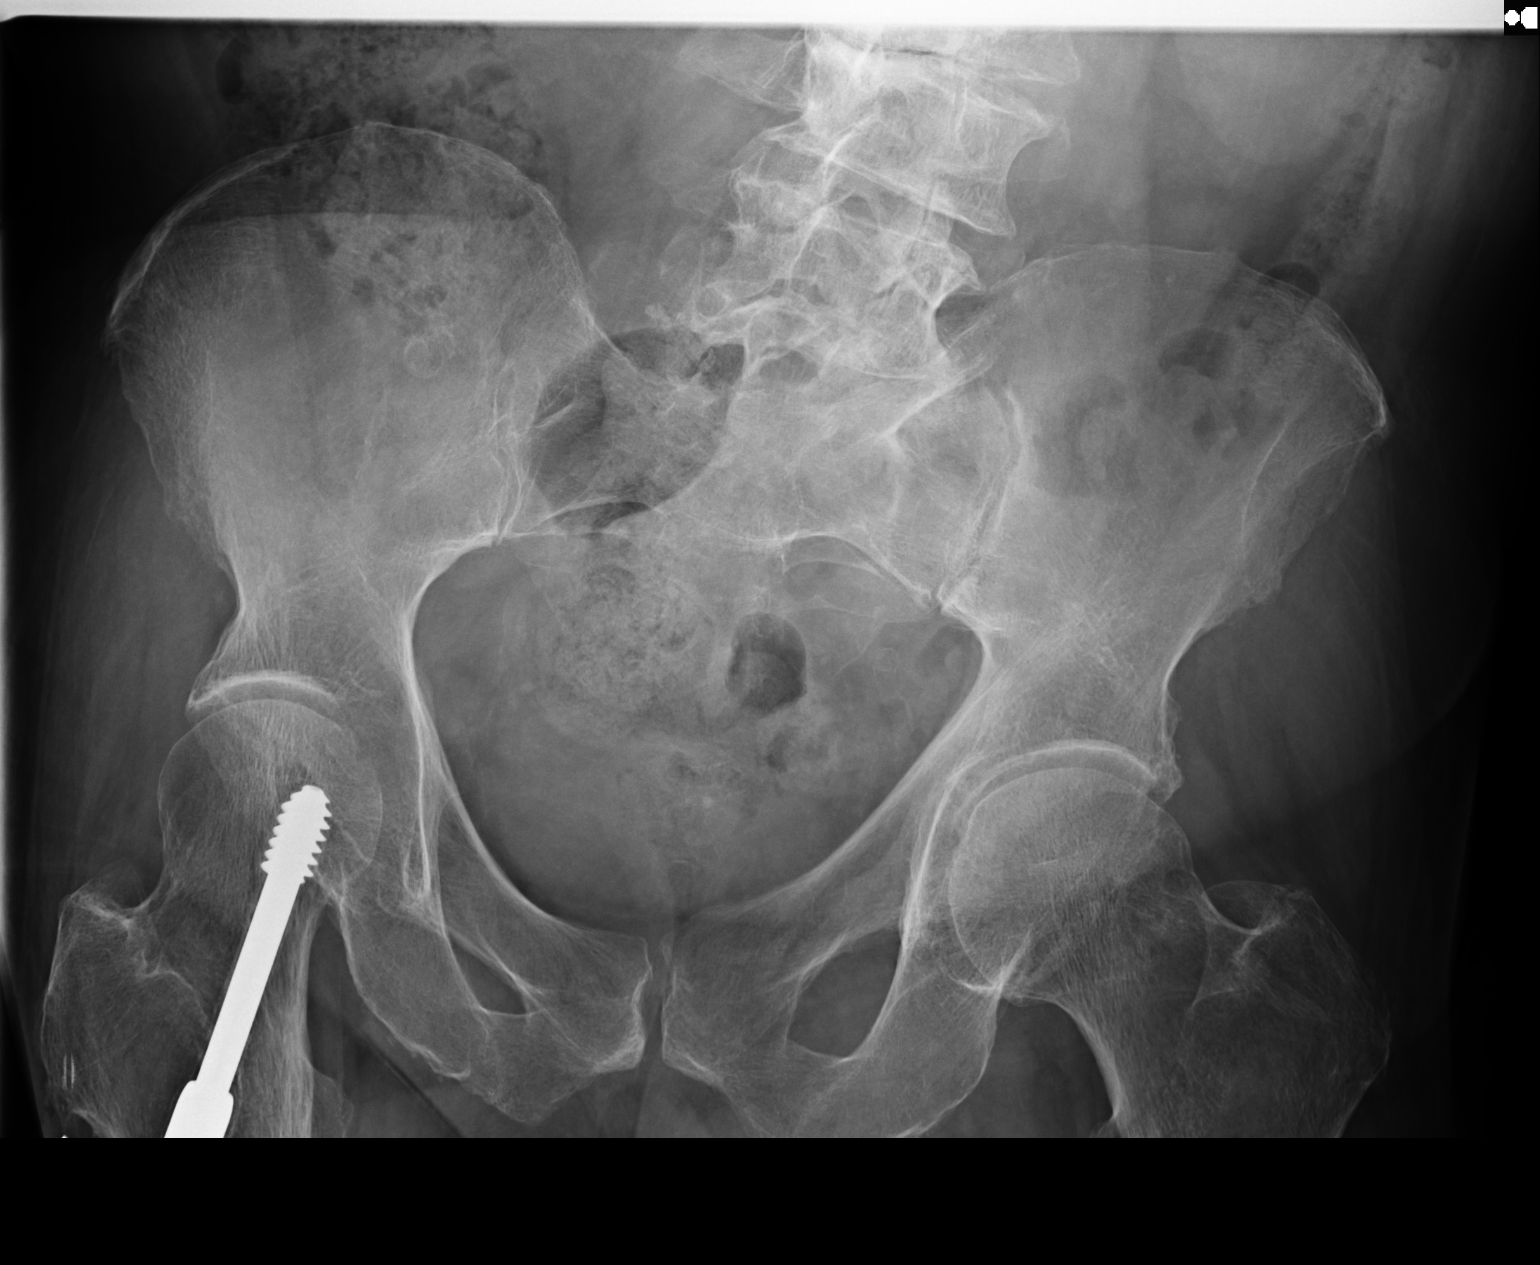
[im 2/3]
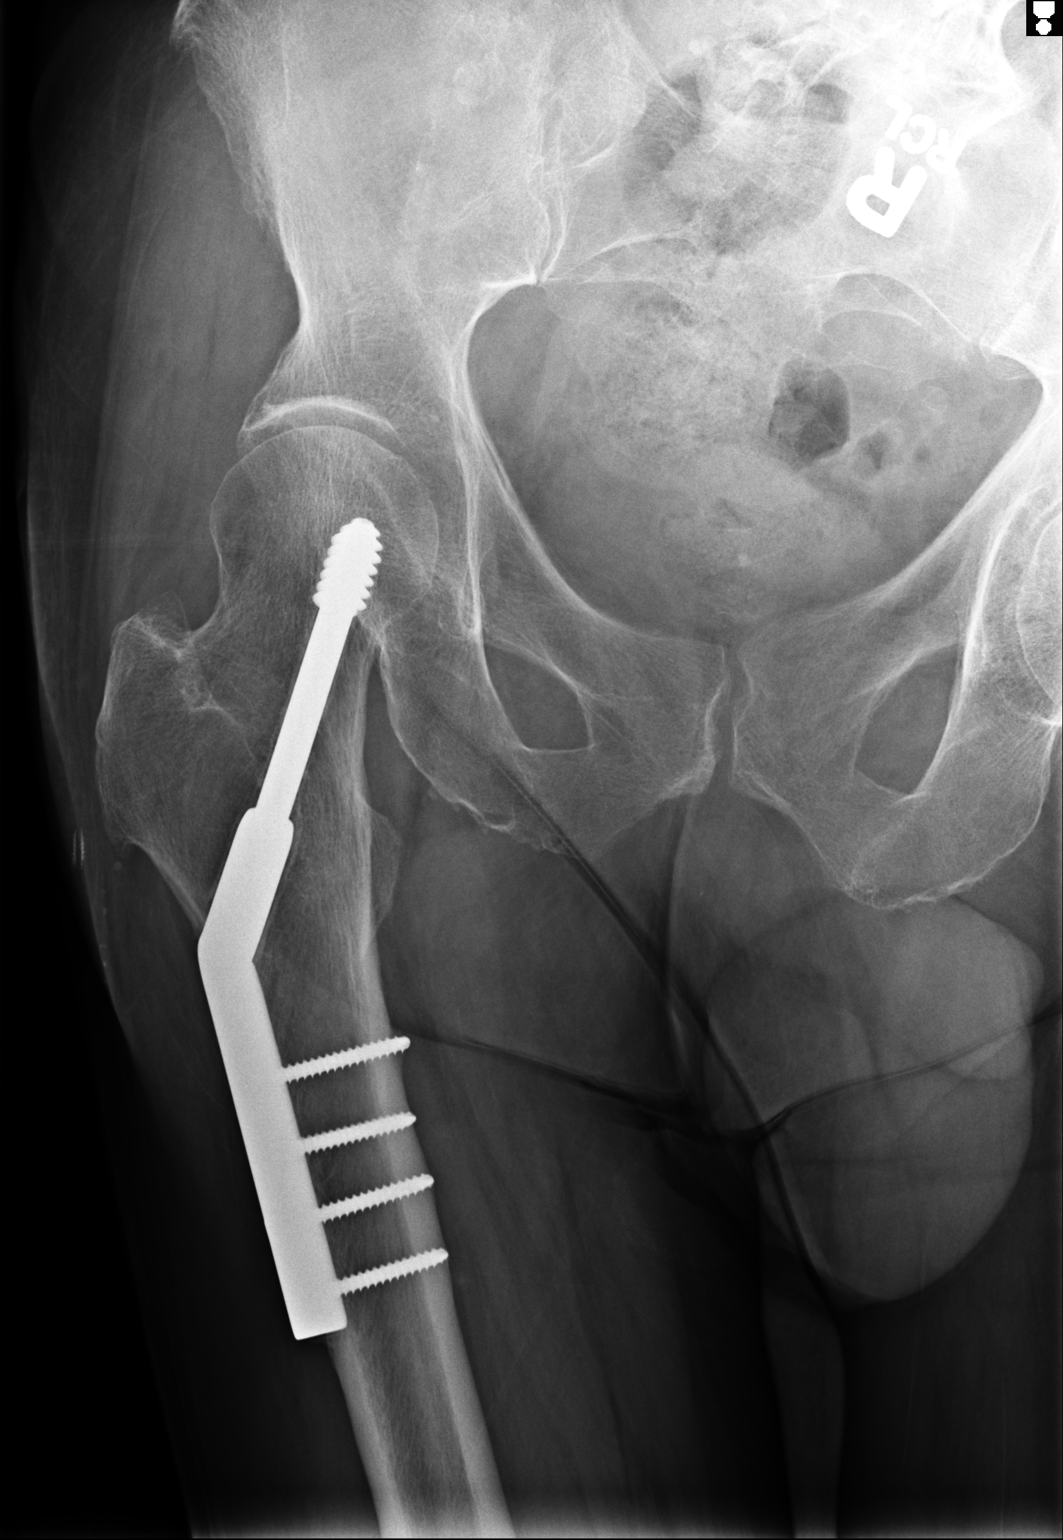
[im 3/3]
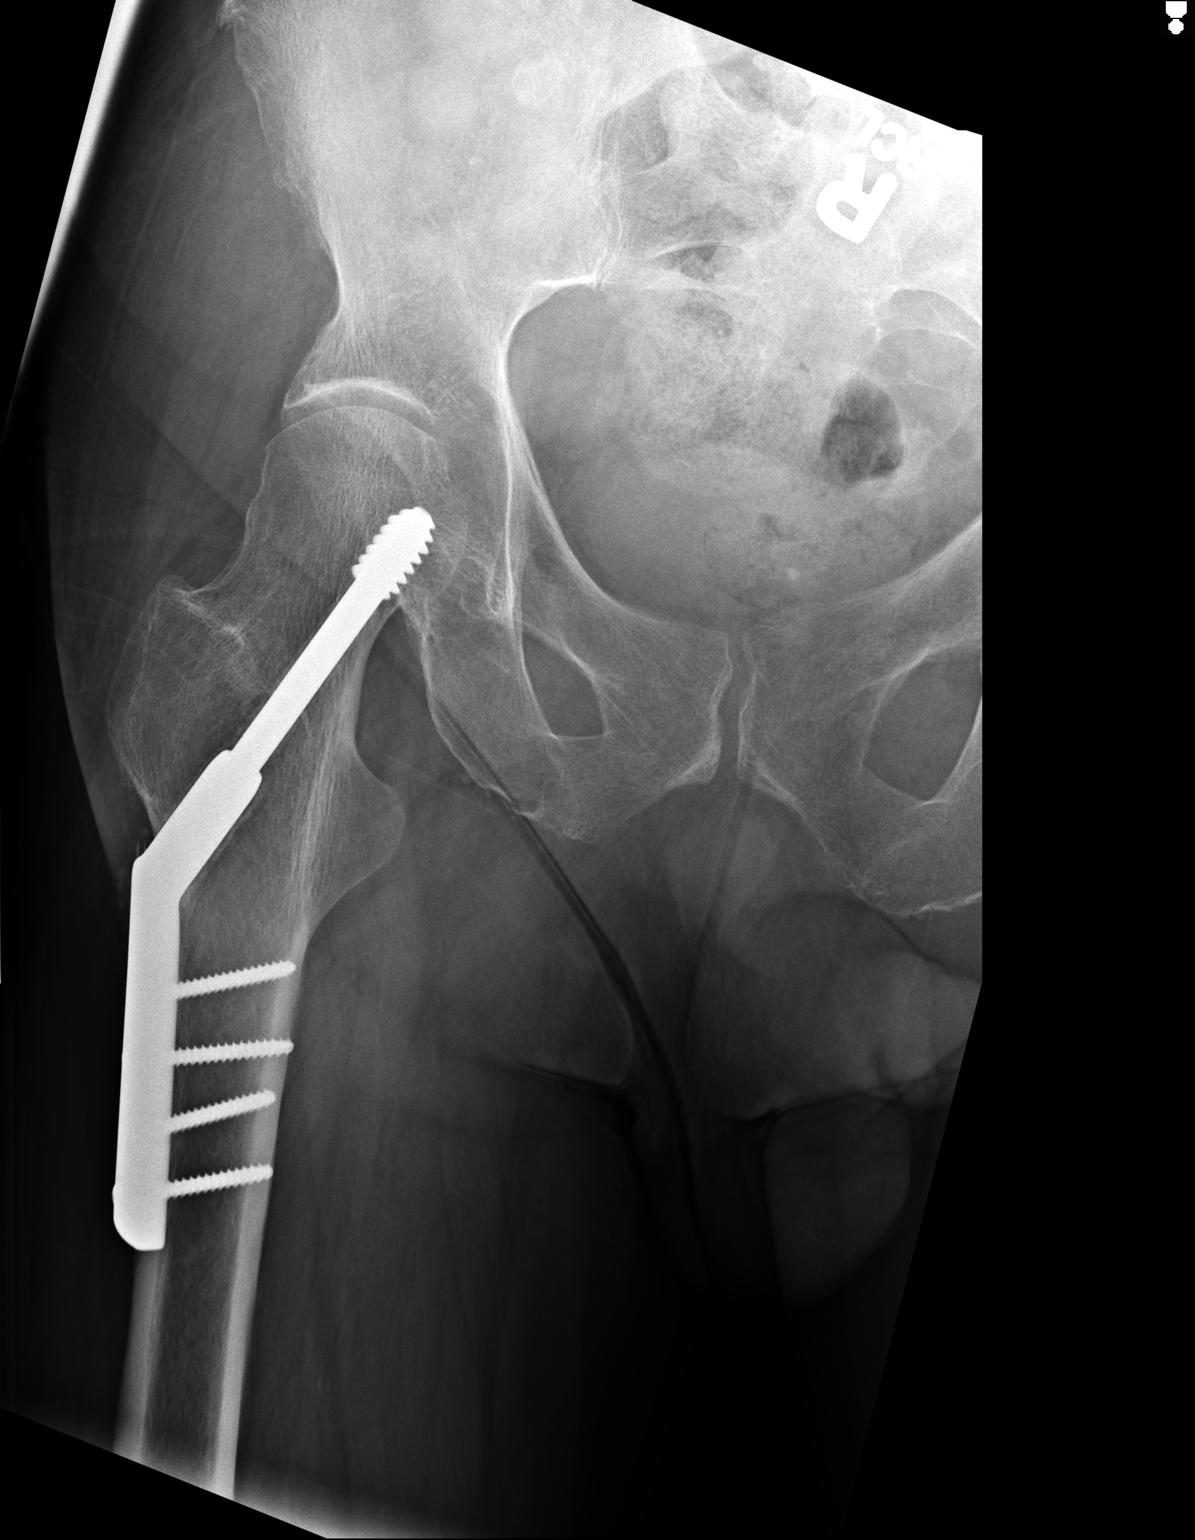

[3 of 3 positions shown; findings below may reference images not displayed]

FINDINGS: Frontal pelvis as well as frontal and lateral right hip images were
obtained. There is postoperative change in the proximal right femur.
The tip of the screws in the proximal femoral head. There is no
acute fracture or dislocation. There is slight symmetric narrowing
of both hip joints. Bones are osteoporotic. There is lower lumbar
levoscoliosis with a rotatory component.
IMPRESSION: Postoperative fixation on the right with screw tip in the proximal
right femoral head. No acute fracture or dislocation apparent. Bones
osteoporotic. Mild osteoarthritic change. Lower lumbar scoliosis.

## 2015-07-02 ENCOUNTER — Ambulatory Visit (INDEPENDENT_AMBULATORY_CARE_PROVIDER_SITE_OTHER): Payer: BLUE CROSS/BLUE SHIELD

## 2015-07-02 ENCOUNTER — Ambulatory Visit (INDEPENDENT_AMBULATORY_CARE_PROVIDER_SITE_OTHER): Payer: BLUE CROSS/BLUE SHIELD | Admitting: Podiatry

## 2015-07-02 ENCOUNTER — Encounter: Payer: Self-pay | Admitting: Podiatry

## 2015-07-02 VITALS — BP 112/83 | HR 100 | Resp 16

## 2015-07-02 DIAGNOSIS — M2042 Other hammer toe(s) (acquired), left foot: Secondary | ICD-10-CM

## 2015-07-02 DIAGNOSIS — Z9889 Other specified postprocedural states: Secondary | ICD-10-CM | POA: Diagnosis not present

## 2015-07-02 DIAGNOSIS — S90122A Contusion of left lesser toe(s) without damage to nail, initial encounter: Secondary | ICD-10-CM

## 2015-07-02 NOTE — Progress Notes (Signed)
He presents today concerned of an injury to the second toe of his left foot. He states that he sees a red spot and he's concerned about it. Denies any trauma.  Objective: Vital signs are stable he is alert and oriented 3 pulses are palpable. Left foot does demonstrate well-healed rectus surgical toes to 3 and 4 of the left foot. He has a slight abrasion to the DIPJ measuring less than 2 mm in diameter second digit left foot. I see no signs of infection.  Assessment: Contusion and abrasion to toe.  Plan: Hollow up as needed.

## 2015-11-19 ENCOUNTER — Encounter: Payer: Self-pay | Admitting: Occupational Therapy

## 2015-11-19 ENCOUNTER — Ambulatory Visit: Payer: BLUE CROSS/BLUE SHIELD | Attending: Rheumatology | Admitting: Occupational Therapy

## 2015-11-19 DIAGNOSIS — G5602 Carpal tunnel syndrome, left upper limb: Secondary | ICD-10-CM

## 2015-11-19 DIAGNOSIS — M79641 Pain in right hand: Secondary | ICD-10-CM | POA: Diagnosis present

## 2015-11-19 DIAGNOSIS — M6281 Muscle weakness (generalized): Secondary | ICD-10-CM | POA: Diagnosis present

## 2015-11-19 DIAGNOSIS — M25532 Pain in left wrist: Secondary | ICD-10-CM | POA: Diagnosis present

## 2015-11-19 DIAGNOSIS — R29898 Other symptoms and signs involving the musculoskeletal system: Secondary | ICD-10-CM

## 2015-11-19 DIAGNOSIS — G5601 Carpal tunnel syndrome, right upper limb: Secondary | ICD-10-CM | POA: Insufficient documentation

## 2015-11-19 DIAGNOSIS — M79642 Pain in left hand: Secondary | ICD-10-CM | POA: Diagnosis present

## 2015-11-19 NOTE — Patient Instructions (Signed)
Median Nerve Gliding exercises and bring splint into next visit. Neutral wrist for functional activity.

## 2015-11-19 NOTE — Therapy (Signed)
[place] [facility] [address] [place], [address] Phone: [phone]   Fax:  [phone]  Occupational Therapy Evaluation  Patient Details  Name: Anthony Evans MRN: [id number] Date of Birth: 31-May-1951 Referring Provider: Dr Sammuel Bailiff  Encounter Date: 11/19/2015      OT End of Session - 11/19/15 1118    Visit Number 1   Number of Visits 12   Date for OT Re-Evaluation 01/10/16   OT Start Time 1025   OT Stop Time 1120   OT Time Calculation (min) 55 min   Activity Tolerance Patient tolerated treatment well   Behavior During Therapy [facility] for tasks assessed/performed      Past Medical History  Diagnosis Date  . Back pain   . Thyroid disease   . Hypertension   . Reflux   . GERD (gastroesophageal reflux disease)   . Arthritis     psoriatic  . Degenerative disc disease   . Thalassemia   . Seizures ([place])     last seizure about 10 years ago  . 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 Correction hammer toe    . Eye surgery Right     cataract  . Knee arthroplasty Left     total knee replacement  . Hip fracture surgery Right     pins in hip  . Cervical fusion    . Back surgery      screws and plate in c4 and c5  . Cataract extraction w/phaco Left 05/15/2015    Procedure: CATARACT EXTRACTION PHACO AND INTRAOCULAR LENS PLACEMENT (IOC);  Surgeon: Lyla Glassing, MD;  Location: [facility] ORS;  Service: Ophthalmology;  Laterality: Left;  [place]: 2:40.9 AP: 17.3 CDE: 27.88    There were no vitals filed for this visit.  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 He states that he has seen Dr Jefm Bryant L hand weakness and reports of pain. He states that ~58months ago he was dx w/ CTS bilateral hands and since he uses his left hand as his dominant hand, he has noticed increaesd weakness and  pain at noc.   Pertinent History CVA invitro affecting R side. Uses left as dominant hand   Patient Stated Goals Be able to wrok at hte level that I want to; increase strength left hand.   Currently in Pain? No/denies           [facility] OT Assessment - 11/19/15 0001    Assessment   Diagnosis Left wrist/hand pain/decreased strength   Referring Provider Dr Jeni Salles. Jefm Bryant   Onset Date 11/18/14   Assessment Pt reports that his ymptoms began gradually about a year ago wherre he noticed decreased strength  and gradualy difficulty performing regular work activities. He experiences numbness and paresthesias. He states that he was told ~23months ago that he had bilateral CTS - per  Dr. Selena Batten [facility] [place]) when he had nerve conduction study.   Prior Therapy None   ADL   ADL comments Pt repotrs that he has difficulty putting pressure on his left hand/wrist/carpal tunnel when getting off of commode. Due to pressure over CT   Written Expression   Dominant Hand Left   Handwriting --  No change per pt report   Sensation   Light Touch Appears Intact  Left; Impaired right non-dominant side 2* CVA invitro   Hot/Cold Appears Intact  L hand   Coordination   Gross Motor Movements are Fluid and Coordinated Yes  Left UE WFL's R UE invitro CVA   Fine Motor Movements are Fluid and Coordinated --  WFL's and no change per pt report   Coordination Coordination is grossly WFL's left hand. Pt reports no change subjectively.   ROM / Strength   AROM / PROM / Strength AROM   AROM   Overall AROM  Within functional limits for  tasks performed  Psoriatic arthritis mostly at DIP's of finger joints.   Overall AROM Comments Grossly WFL's for tasks performed.   Hand Function   Right Hand Grip (lbs) 15, 25,20#  Invitro CVA affecting R side   Left Hand Grip (lbs) 56, 58, 62#   Left Hand Lateral Pinch 21.5 lbs   Left 3 point pinch 15.5 lbs   Comment Phalens test left: + after 26 seconds. Tinels over Carpal Tunnel:  negative.       Pt was educated in the following:  Median Nerve Gliding exercises and bring splint into next visit. Neutral wrist for functional activity.         OT Treatments/Exercises (OP) - 11/19/15 0001    Exercises   Exercises Wrist;Hand  Median Nerve Gliding exercises issued and reviewed   Splinting   Splinting Pt states that he has a splint as issued per Dr Burt Ek at work that he wears at night left hand. He was educated to bring splint into next visit for assessment of fit and recommendations for use.               OT Education - 11/19/15 1117    Education provided Yes   Education Details Median Nerve Gliding exercises and bring splint into next visit. Neutral wrist for functional activity.   Person(s) Educated Patient   Methods Explanation;Demonstration;Verbal cues;Tactile cues;Handout   Comprehension Verbalized understanding;Returned demonstration;Verbal cues required          OT Short Term Goals - 11/19/15 1138    OT SHORT TERM GOAL #1   Title Pt will be I HEP for median nerve gliding left   Baseline vc's for positioning   Time 3   Period Weeks   Status New   OT SHORT TERM GOAL #2   Title Pt will reports pain left hand as less than 3/10 along carpal tunnel at end of workday using neutral wrist positioning during functional activity for work/home.   Baseline up to 6/10   Time 3   Period Weeks   Status New   OT SHORT TERM GOAL #3   Title Pt will be Mod I splinting use, care and precautions L wrist/hand   Time 3   Period Weeks   Status New           OT  Long Term Goals - 11/19/15 1140    OT LONG TERM GOAL #1   Title Pt will be Mod I upgrade HEP for L UE   Time 6  Period Weeks   Status New   OT LONG TERM GOAL #2   Title Pt will reports pain as 2/10 or less after workday activities L carpal tunnel   Time 6   Period Weeks   Status New   OT LONG TERM GOAL #3   Title Pt will demonstrate increased grip strength L by 5# or greater   Time 6   Period Weeks   Status New   OT LONG TERM GOAL #4   Title Pt will be I splint use, care and wear for specific activities at work and home as needed for support   Time 6   Period Weeks   Status New   OT LONG TERM GOAL #5   Title Pt will be Mod I ADL's and IADL's using left dominant UE   Time 6   Period Weeks   Status New               Plan - 11/19/15 1125    Clinical Impression Statement Pt is a 64 y/o left hand dominant male whom presents today w/ dx: hand pain. His priamry complaints consist of decreased strength for work and functional activities per his report. He also c/o pain in wrist at noc and states that he wears a brace at night and when sleeping (this was issued through Dr Burt Ek through his work). He was positive for Phalen's testing Left after 26 seconds but negative for Tinel's over carpal tunnel. He states that approximatesly 6 months ago he was dx: w/ bilateral CTS by a doctor in [place], [place]. He should benefit from out-pt OT for treatment of signs/symptoms of CTS L > R hands, splinting, pt education and home program as wel las possible a/e to assist in decreasing symptoms at home and work.    Pt will benefit from skilled therapeutic intervention in order to improve on the following deficits (Retired) Decreased coordination;Impaired UE functional use;Pain;Decreased strength;Impaired sensation   Rehab Potential Fair   Clinical Impairments Affecting Rehab Potential Significant PMH (refer to [place] for complete details) to include R sided weakness secondary to CVA in vitro  impacting functinoal use R UE and his ability to assist left for home program. Chart revie walso indicates that pt is allergic to steroids - although pt states that he is not allergic to oral medications and was noted to be allergic once to a steroid eye ointment that he used after a surgery. ?ability to tolerate or use Iontophoresis?   OT Frequency 2x / week   OT Duration 6 weeks   OT Treatment/Interventions Therapeutic exercise;Patient/family education;Manual Therapy;Ultrasound;Iontophoresis;Therapeutic exercises;DME and/or AE instruction;Therapeutic activities;Fluidtherapy;Moist Heat;Contrast Bath  Note: Clinical impairments affecting rehab potential   Plan Pt to bring splint into next visit for assessment and recommendations for use vs custom splinting; review median nerve glides. Consider Iontophoresis with dexamethasone should Dr Jefm Bryant be agreeable with this due to his PMH vs pulsed ultrasound along carpal tunnel left.   OT Home Exercise Plan See pt instruction   Consulted and Agree with Plan of Care Patient        Problem List There are no active problems to display for this patient.   Anthony Evans, Anthony Evans, OTR/L 11/19/2015, 11:51 AM  [place] [facility] [address], [place], [address] Phone: [phone]   Fax:  [phone]  Name: Anthony Evans MRN: [id number] Date of Birth: 1951/09/25

## 2015-11-24 ENCOUNTER — Ambulatory Visit: Payer: BLUE CROSS/BLUE SHIELD | Admitting: Occupational Therapy

## 2015-11-24 DIAGNOSIS — G5602 Carpal tunnel syndrome, left upper limb: Secondary | ICD-10-CM

## 2015-11-24 DIAGNOSIS — M79641 Pain in right hand: Secondary | ICD-10-CM

## 2015-11-24 DIAGNOSIS — G5601 Carpal tunnel syndrome, right upper limb: Secondary | ICD-10-CM

## 2015-11-24 DIAGNOSIS — R29898 Other symptoms and signs involving the musculoskeletal system: Secondary | ICD-10-CM

## 2015-11-24 DIAGNOSIS — M79642 Pain in left hand: Secondary | ICD-10-CM | POA: Diagnosis not present

## 2015-11-24 NOTE — Therapy (Signed)
[place] [facility] [address] [place], [address] Phone: [phone]   Fax:  [phone]  Occupational Therapy Treatment  Patient Details  Name: Anthony Evans MRN: [id number] Date of Birth: June 16, 1951 Referring Provider: Dr Sammuel Bailiff  Encounter Date: 11/24/2015      OT End of Session - 11/24/15 1137    Visit Number 2   Number of Visits 12   Date for OT Re-Evaluation 01/10/16   OT Start Time 1110   OT Stop Time 1145   OT Time Calculation (min) 35 min   Activity Tolerance Patient tolerated treatment well   Behavior During Therapy [facility] for tasks assessed/performed      Past Medical History  Diagnosis Date  . Back pain   . Thyroid disease   . Hypertension   . Reflux   . GERD (gastroesophageal reflux disease)   . Arthritis     psoriatic  . Degenerative disc disease   . Thalassemia   . Seizures ([place])     last seizure about 10 years ago  . 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 Correction hammer toe    . Eye surgery Right     cataract  . Knee arthroplasty Left     total knee replacement  . Hip fracture surgery Right     pins in hip  . Cervical fusion    . Back surgery      screws and plate in c4 and c5  . Cataract extraction w/phaco Left 05/15/2015    Procedure: CATARACT EXTRACTION PHACO AND INTRAOCULAR LENS PLACEMENT (IOC);  Surgeon: Lyla Glassing, MD;  Location: [facility] ORS;  Service: Ophthalmology;  Laterality: Left;  [place]: 2:40.9 AP: 17.3 CDE: 27.88    There were no vitals filed for this visit.  Visit Diagnosis:  Hand pain, left  Carpal tunnel syndrome, left  Carpal tunnel syndrome, right  Hand pain, right  Decreased grip strength of right hand      Subjective Assessment - 11/24/15 1113    Subjective  Pt states "I forget to bring my splint for you to see" Pt states that his splint is a pre-fab wrist support for left hand and  that he wears PRN at work and at [employer] for pain/decreased symptoms left.   Pertinent History CVA invitro affecting R side. Uses left as dominant hand   Patient Stated Goals Be able to wrok at hte level that I want to; increase strength left hand.   Currently in Pain? No/denies   Multiple Pain Sites No                      OT Treatments/Exercises (OP) - 11/24/15 0001    Exercises   Exercises Wrist;Hand  Median nerve gliding exercises    Modalities   Modalities Iontophoresis   Iontophoresis   Type of Iontophoresis Dexamethasone   Location Left carpal Tunnel    Dose Current at 2.5 2.0 cc   Time 17 min      Pt Instructions:  Continue with HEP for median nerve gliding and of splint use at noc and during day PRN at work (pt has pre-fab splints at work and home) left hand/UE.          OT Education - 11/24/15 1136    Education provided Yes   Education Details Median nerve gliding ex's and bring splint into  next visit. Neutral wrist for functional activity.    Person(s) Educated Patient   Methods Explanation;Demonstration;Verbal cues;Tactile cues   Comprehension Verbalized understanding;Returned demonstration;Verbal cues required          OT Short Term Goals - 11/19/15 1138    OT SHORT TERM GOAL #1   Title Pt will be I HEP for median nerve gliding left   Baseline vc's for positioning   Time 3   Period Weeks   Status New   OT SHORT TERM GOAL #2   Title Pt will reports pain left hand as less than 3/10 along carpal tunnel at end of workday using neutral wrist positioning during functional activity for work/home.   Baseline up to 6/10   Time 3   Period Weeks   Status New   OT SHORT TERM GOAL #3   Title Pt will be Mod I splinting use, care and precautions L wrist/hand   Time 3   Period Weeks   Status New           OT Long Term Goals - 11/19/15 1140    OT LONG TERM GOAL #1   Title Pt will be Mod I upgrade HEP for L UE   Time 6   Period Weeks   Status  New   OT LONG TERM GOAL #2   Title Pt will reports pain as 2/10 or less after workday activities L carpal tunnel   Time 6   Period Weeks   Status New   OT LONG TERM GOAL #3   Title Pt will demonstrate increased grip strength L by 5# or greater   Time 6   Period Weeks   Status New   OT LONG TERM GOAL #4   Title Pt will be I splint use, care and wear for specific activities at work and home as needed for support   Time 6   Period Weeks   Status New   OT LONG TERM GOAL #5   Title Pt will be Mod I ADL's and IADL's using left dominant UE   Time 6   Period Weeks   Status New               Plan - 11/24/15 1145    Clinical Impression Statement Pt tolerated Iontophoresis left carpal tunnel w/ Dexamethasone 2 cc w/o c/o or skin irritation noted. VC's for median nerve gliding ex's and positioning. Pt should cont to wear splints at work and at [employer] left wrist - pt to bring to next visit.   Pt will benefit from skilled therapeutic intervention in order to improve on the following deficits (Retired) Decreased coordination;Impaired UE functional use;Pain;Decreased strength;Impaired sensation   Rehab Potential Fair   Clinical Impairments Affecting Rehab Potential Significant PMH (refer to [place] for complete details) to include 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 Chart revie walso indicates that pt is allergic to steroids - although pt states that he is not allergic to oral medications and was noted to be allergic once to a steroid eye ointment that he used after a surgery. ?ability to tolerate or use Iontophoresis?   OT Frequency 2x / week   OT Duration 6 weeks   OT Treatment/Interventions Therapeutic exercise;Patient/family education;Manual Therapy;Ultrasound;Iontophoresis;Therapeutic exercises;DME and/or AE instruction;Therapeutic activities;Fluidtherapy;Moist Heat;Contrast Bath   Plan Pt to bring splint to  next visit for check; median nerve gliding left hand/review HEP; Ionto to left carpal  tunnel.   OT Home Exercise Plan See pt instruction   Consulted and Agree with Plan of Care Patient        Problem List There are no active problems to display for this patient.   Carlynn Herald, Amy Beth Dixon, OTR/L 11/24/2015, 11:50 AM  [place] [facility] [address] [place], [address] Phone: [phone]   Fax:  [phone]  Name: Anthony Evans MRN: [id number] Date of Birth: 1951/03/14

## 2015-11-24 NOTE — Patient Instructions (Signed)
Continue with HEP for median nerve gliding and of splint use at noc and during day PRN at work (pt has pre-fab splints at work and home) left hand/UE.

## 2015-11-26 ENCOUNTER — Ambulatory Visit: Payer: BLUE CROSS/BLUE SHIELD | Admitting: Occupational Therapy

## 2015-11-26 ENCOUNTER — Encounter: Payer: BLUE CROSS/BLUE SHIELD | Admitting: Occupational Therapy

## 2015-11-26 ENCOUNTER — Encounter: Payer: Self-pay | Admitting: Occupational Therapy

## 2015-11-26 DIAGNOSIS — M79642 Pain in left hand: Secondary | ICD-10-CM | POA: Diagnosis not present

## 2015-11-26 DIAGNOSIS — M25532 Pain in left wrist: Secondary | ICD-10-CM

## 2015-11-26 DIAGNOSIS — G5602 Carpal tunnel syndrome, left upper limb: Secondary | ICD-10-CM

## 2015-11-26 NOTE — Therapy (Addendum)
[place] [facility] [address], [place], [address] Phone: [phone]   Fax:  [phone]  Occupational Therapy Treatment  Patient Details  Name: MAXSTON KASIM MRN: [id number] Date of Birth: 05-06-1951 Referring Provider: Dr Sammuel Bailiff  Encounter Date: 11/26/2015      OT End of Session - 11/26/15 1611    Visit Number 3   Number of Visits 12   Date for OT Re-Evaluation 01/10/16   OT Start Time 0900   OT Stop Time 0945   OT Time Calculation (min) 45 min   Activity Tolerance Patient tolerated treatment well   Behavior During Therapy [facility] for tasks assessed/performed      Past Medical History  Diagnosis Date  . Back pain   . Thyroid disease   . Hypertension   . Reflux   . GERD (gastroesophageal reflux disease)   . Arthritis     psoriatic  . Degenerative disc disease   . Thalassemia   . Seizures ([place])     last seizure about 10 years ago  . 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 Correction hammer toe    . Eye surgery Right     cataract  . Knee arthroplasty Left     total knee replacement  . Hip fracture surgery Right     pins in hip  . Cervical fusion    . Back surgery      screws and plate in c4 and c5  . Cataract extraction w/phaco Left 05/15/2015    Procedure: CATARACT EXTRACTION PHACO AND INTRAOCULAR LENS PLACEMENT (IOC);  Surgeon: Lyla Glassing, MD;  Location: [facility] ORS;  Service: Ophthalmology;  Laterality: Left;  [place]: 2:40.9 AP: 17.3 CDE: 27.88    There were no vitals filed for this visit.  Visit Diagnosis:  Left wrist pain  Hand pain, left  Carpal tunnel syndrome, left      Subjective Assessment - 11/26/15 1604    Subjective  My pain is not changed, I am doing my home program   Currently in Pain? Yes   Pain Score 3    Pain Location Wrist   Pain Orientation Left   Pain Descriptors / Indicators Aching   Pain Type  Chronic pain   Effect of Pain on Daily Activities Makes me stop what i am doing to shake out my hand when i am trying to do chair set up   Multiple Pain Sites No                      OT Treatments/Exercises (OP) - 11/26/15 0001    Exercises   Exercises Wrist;Hand  Median nerve gliding exercises    Modalities   Modalities Iontophoresis   Iontophoresis   Type of Iontophoresis Dexamethasone   Location Left carpal Tunnel    Dose Current at 2.5 2.0 cc   Time 29 min                OT Education - 11/26/15 1609    Education Details Modified median nerve gliding to eliminate step 6 and adjust timing of performing entire routine. Seeking smooth gliding routine.    Person(s) Educated Patient   Methods Explanation;Demonstration;Verbal cues;Tactile cues   Comprehension Verbalized understanding;Returned demonstration          OT Short Term Goals - 11/19/15 1138    OT SHORT TERM GOAL #  1   Title Pt will be I HEP for median nerve gliding left   Baseline vc's for positioning   Time 3   Period Weeks   Status New   OT SHORT TERM GOAL #2   Title Pt will reports pain left hand as less than 3/10 along carpal tunnel at end of workday using neutral wrist positioning during functional activity for work/home.   Baseline up to 6/10   Time 3   Period Weeks   Status New   OT SHORT TERM GOAL #3   Title Pt will be Mod I splinting use, care and precautions L wrist/hand   Time 3   Period Weeks   Status New           OT Long Term Goals - 11/19/15 1140    OT LONG TERM GOAL #1   Title Pt will be Mod I upgrade HEP for L UE   Time 6   Period Weeks   Status New   OT LONG TERM GOAL #2   Title Pt will reports pain as 2/10 or less after workday activities L carpal tunnel   Time 6   Period Weeks   Status New   OT LONG TERM GOAL #3   Title Pt will demonstrate increased grip strength L by 5# or greater   Time 6   Period Weeks   Status New   OT LONG TERM GOAL #4   Title  Pt will be I splint use, care and wear for specific activities at work and home as needed for support   Time 6   Period Weeks   Status New   OT LONG TERM GOAL #5   Title Pt will be Mod I ADL's and IADL's using left dominant UE   Time 6   Period Weeks   Status New     S: Patient has no increase or decrease in left carpal tunnel symptoms since last visit on Monday, 12/19. My wrist pain gets worse when i am moving chairs and tables as part of work set up. I try to wear a splint for work and at night for sleep.  I Will bring splint in next visit. I wake up and pain is better in the morning after wearing splint, but I sometimes have trouble picking things up. No problem following the treatment with the dexamethasone. Nerve gliding bothering my thumb. O: nerve gliding reviewed, iontophoresis completed A: Median nerve irritation persists with daily use gripping tasks. Nerve gliding issue with step 6: patient flexing wrist to use right hand to abduct thumb, no need to pull thumb out as step 6, instead patient will abduct and extend thumb away from palm.  No change in median nerve symptoms to date. Patient tolerates iontophoresis and median nerve gliding re-instruction session well.          Plan - 11/26/15 1612    Clinical Impression Statement Patient benefitting from nerve gliding, splint use, and iontophoresis   continue to use splints for work tasks and for sleep. Median nerve glides up to 4 times each day, modified to increase gentle continuous glide.     Pt will benefit from skilled therapeutic intervention in order to improve on the following deficits (Retired) Pain;Decreased activity tolerance   Rehab Potential Good   OT Frequency 2x / week   OT Duration 12 weeks   OT Treatment/Interventions Iontophoresis;Therapeutic exercise   Plan Iontophoresis, Median nerve gliding, adjust and assess splint, Pain control,    OT  Home Exercise Plan adjusted median nerve gliding         Problem  List There are no active problems to display for this patient.   Anyssa Sharpless, OT 11/26/2015, 4:15 PM  [place] [facility] [address], [place], [address] Phone: [phone]   Fax:  [phone]  Name: JAYSEON NEARHOOD MRN: [id number] Date of Birth: 02/12/1951

## 2015-12-02 ENCOUNTER — Ambulatory Visit: Payer: BLUE CROSS/BLUE SHIELD | Admitting: Occupational Therapy

## 2015-12-02 ENCOUNTER — Encounter: Payer: Self-pay | Admitting: Occupational Therapy

## 2015-12-02 DIAGNOSIS — M79642 Pain in left hand: Secondary | ICD-10-CM | POA: Diagnosis not present

## 2015-12-02 DIAGNOSIS — M25532 Pain in left wrist: Secondary | ICD-10-CM

## 2015-12-02 DIAGNOSIS — G5602 Carpal tunnel syndrome, left upper limb: Secondary | ICD-10-CM

## 2015-12-02 NOTE — Therapy (Signed)
[facility] [address] [place], [address] Phone: [phone]   Fax:  [phone]  Occupational Therapy Treatment  Patient Details  Name: JESTON STATHIS MRN: [id number] Date of Birth: 1951/09/10 Referring Provider: Dr Sammuel Bailiff  Encounter Date: 12/02/2015      OT End of Session - 12/02/15 1414    Visit Number 4   Number of Visits 12   Date for OT Re-Evaluation 01/10/16   OT Start Time 0150   OT Stop Time 1425   OT Time Calculation (min) 755 min   Equipment Utilized During Treatment iontophoresis, dexamethasone   Activity Tolerance Patient tolerated treatment well      Past Medical History  Diagnosis Date  . Back pain   . Thyroid disease   . Hypertension   . Reflux   . GERD (gastroesophageal reflux disease)   . Arthritis     psoriatic  . Degenerative disc disease   . Thalassemia   . Seizures ([place])     last seizure about 10 years ago  . 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 Correction hammer toe    . Eye surgery Right     cataract  . Knee arthroplasty Left     total knee replacement  . Hip fracture surgery Right     pins in hip  . Cervical fusion    . Back surgery      screws and plate in c4 and c5  . Cataract extraction w/phaco Left 05/15/2015    Procedure: CATARACT EXTRACTION PHACO AND INTRAOCULAR LENS PLACEMENT (IOC);  Surgeon: Lyla Glassing, MD;  Location: [facility] ORS;  Service: Ophthalmology;  Laterality: Left;  [place]: 2:40.9 AP: 17.3 CDE: 27.88    There were no vitals filed for this visit.  Visit Diagnosis:  Carpal tunnel syndrome, left  Left wrist pain      Subjective Assessment - 12/02/15 1355    Subjective  I have been home resting for the holidays and the wrist / carpel tunnel feels pretty good.  Brought my splint to have it checked.  Considering about how to modify my work to reduce the stress & pain on my wrist.  Modified my median nerve glides.    Repetition Increases Symptoms   Patient Stated Goals Be able to work at level that I want to; increase strength left hand. Modify work to NOT increase my symptoms.    Currently in Pain? No/denies   Multiple Pain Sites No                      OT Treatments/Exercises (OP) - 12/02/15 0001    Exercises   Exercises Wrist;Hand  Median nerve gliding exercises    Modalities   Modalities Iontophoresis   Iontophoresis   Type of Iontophoresis Dexamethasone   Location Left carpal Tunnel    Dose Current at 2.5 2.0 cc   Time 20 min                  OT Short Term Goals - 11/19/15 1138    OT SHORT TERM GOAL #1   Title Pt will be I HEP for median nerve gliding left   Baseline vc's for positioning   Time 3   Period Weeks   Status New   OT SHORT TERM GOAL #2   Title Pt will reports pain left hand  as less than 3/10 along carpal tunnel at end of workday using neutral wrist positioning during functional activity for work/home.   Baseline up to 6/10   Time 3   Period Weeks   Status New   OT SHORT TERM GOAL #3   Title Pt will be Mod I splinting use, care and precautions L wrist/hand   Time 3   Period Weeks   Status New           OT Long Term Goals - 11/19/15 1140    OT LONG TERM GOAL #1   Title Pt will be Mod I upgrade HEP for L UE   Time 6   Period Weeks   Status New   OT LONG TERM GOAL #2   Title Pt will reports pain as 2/10 or less after workday activities L carpal tunnel   Time 6   Period Weeks   Status New   OT LONG TERM GOAL #3   Title Pt will demonstrate increased grip strength L by 5# or greater   Time 6   Period Weeks   Status New   OT LONG TERM GOAL #4   Title Pt will be I splint use, care and wear for specific activities at work and home as needed for support   Time 6   Period Weeks   Status New   OT LONG TERM GOAL #5   Title Pt will be Mod I ADL's and IADL's using left dominant UE   Time 6   Period  Weeks   Status New               Plan - 12/02/15 1547    Clinical Impression Statement benefits from time away from work, iontophoresis, splint use at night, nerve glides   Pt will benefit from skilled therapeutic intervention in order to improve on the following deficits (Retired) Pain;Decreased activity tolerance   Rehab Potential Good   Clinical Impairments Affecting Rehab Potential Significant PMH (refer to [place] for complete details) to include 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 Chart revie walso indicates that pt is allergic to steroids - although pt states that he is not allergic to oral medications and was noted to be allergic once to a steroid eye ointment that he used after a surgery. ?ability to tolerate or use Iontophoresis?   OT Frequency 2x / week   OT Duration 12 weeks   OT Treatment/Interventions Iontophoresis;Therapeutic exercise   Plan continue with Ionto, nerve glides, splint, Modify work routine where able to reduce stress on LUE   OT Home Exercise Plan Median nerve gliding, use of splint    Plan: continue with ionto, median nerve gliding, use of splints at work and for sleep. Modify work tasks that stress the LUE .    Problem List There are no active problems to display for this patient.   Keaton Beichner, OTR/L 12/02/2015, 3:50 PM  [facility] [address] [place], [address] Phone: [phone]   Fax:  [phone]  Name: NACHUM BEHR MRN: [id number] Date of Birth: August 08, 1951

## 2015-12-15 ENCOUNTER — Ambulatory Visit: Payer: BLUE CROSS/BLUE SHIELD | Attending: Rheumatology | Admitting: Occupational Therapy

## 2016-04-23 DIAGNOSIS — Z87898 Personal history of other specified conditions: Secondary | ICD-10-CM | POA: Insufficient documentation

## 2016-04-23 DIAGNOSIS — Z86010 Personal history of colonic polyps: Secondary | ICD-10-CM | POA: Insufficient documentation

## 2016-06-11 ENCOUNTER — Encounter: Payer: Self-pay | Admitting: *Deleted

## 2016-06-14 ENCOUNTER — Encounter: Payer: Self-pay | Admitting: *Deleted

## 2016-06-14 ENCOUNTER — Ambulatory Visit: Payer: BLUE CROSS/BLUE SHIELD | Admitting: Anesthesiology

## 2016-06-14 ENCOUNTER — Encounter
Admission: RE | Disposition: A | Discharge status: Home / Self Care | Payer: Self-pay | Source: Ambulatory Visit | Attending: Unknown Physician Specialty

## 2016-06-14 ENCOUNTER — Ambulatory Visit
Admission: RE | Admit: 2016-06-14 | Discharge: 2016-06-14 | Disposition: A | Discharge status: Home / Self Care | Payer: BLUE CROSS/BLUE SHIELD | Source: Ambulatory Visit | Attending: Unknown Physician Specialty | Admitting: Unknown Physician Specialty

## 2016-06-14 DIAGNOSIS — L4050 Arthropathic psoriasis, unspecified: Secondary | ICD-10-CM | POA: Diagnosis not present

## 2016-06-14 DIAGNOSIS — E079 Disorder of thyroid, unspecified: Secondary | ICD-10-CM | POA: Diagnosis not present

## 2016-06-14 DIAGNOSIS — Z91040 Latex allergy status: Secondary | ICD-10-CM | POA: Diagnosis not present

## 2016-06-14 DIAGNOSIS — K219 Gastro-esophageal reflux disease without esophagitis: Secondary | ICD-10-CM | POA: Diagnosis not present

## 2016-06-14 DIAGNOSIS — Z981 Arthrodesis status: Secondary | ICD-10-CM | POA: Diagnosis not present

## 2016-06-14 DIAGNOSIS — Z9842 Cataract extraction status, left eye: Secondary | ICD-10-CM | POA: Diagnosis not present

## 2016-06-14 DIAGNOSIS — R569 Unspecified convulsions: Secondary | ICD-10-CM | POA: Diagnosis not present

## 2016-06-14 DIAGNOSIS — Z79899 Other long term (current) drug therapy: Secondary | ICD-10-CM | POA: Insufficient documentation

## 2016-06-14 DIAGNOSIS — D120 Benign neoplasm of cecum: Secondary | ICD-10-CM | POA: Insufficient documentation

## 2016-06-14 DIAGNOSIS — Z961 Presence of intraocular lens: Secondary | ICD-10-CM | POA: Diagnosis not present

## 2016-06-14 DIAGNOSIS — D123 Benign neoplasm of transverse colon: Secondary | ICD-10-CM | POA: Insufficient documentation

## 2016-06-14 DIAGNOSIS — Z96652 Presence of left artificial knee joint: Secondary | ICD-10-CM | POA: Diagnosis not present

## 2016-06-14 DIAGNOSIS — I10 Essential (primary) hypertension: Secondary | ICD-10-CM | POA: Diagnosis not present

## 2016-06-14 DIAGNOSIS — Z9841 Cataract extraction status, right eye: Secondary | ICD-10-CM | POA: Insufficient documentation

## 2016-06-14 DIAGNOSIS — Z8673 Personal history of transient ischemic attack (TIA), and cerebral infarction without residual deficits: Secondary | ICD-10-CM | POA: Insufficient documentation

## 2016-06-14 DIAGNOSIS — K621 Rectal polyp: Secondary | ICD-10-CM | POA: Diagnosis not present

## 2016-06-14 DIAGNOSIS — D125 Benign neoplasm of sigmoid colon: Secondary | ICD-10-CM | POA: Insufficient documentation

## 2016-06-14 DIAGNOSIS — Z9889 Other specified postprocedural states: Secondary | ICD-10-CM | POA: Insufficient documentation

## 2016-06-14 DIAGNOSIS — D569 Thalassemia, unspecified: Secondary | ICD-10-CM | POA: Insufficient documentation

## 2016-06-14 DIAGNOSIS — Z1211 Encounter for screening for malignant neoplasm of colon: Secondary | ICD-10-CM | POA: Insufficient documentation

## 2016-06-14 DIAGNOSIS — K640 First degree hemorrhoids: Secondary | ICD-10-CM | POA: Insufficient documentation

## 2016-06-14 DIAGNOSIS — Z91048 Other nonmedicinal substance allergy status: Secondary | ICD-10-CM | POA: Diagnosis not present

## 2016-06-14 DIAGNOSIS — Z86010 Personal history of colonic polyps: Secondary | ICD-10-CM | POA: Diagnosis present

## 2016-06-14 DIAGNOSIS — Z888 Allergy status to other drugs, medicaments and biological substances status: Secondary | ICD-10-CM | POA: Insufficient documentation

## 2016-06-14 HISTORY — PX: COLONOSCOPY WITH PROPOFOL: SHX5780

## 2016-06-14 SURGERY — COLONOSCOPY WITH PROPOFOL
Anesthesia: General

## 2016-06-14 MED ORDER — PROPOFOL 500 MG/50ML IV EMUL
INTRAVENOUS | Status: DC | PRN
  Administered 2016-06-14: 50 ug/kg/min via INTRAVENOUS

## 2016-06-14 MED ORDER — SODIUM CHLORIDE 0.9 % IV SOLN: INTRAVENOUS | Status: DC

## 2016-06-14 MED ORDER — FENTANYL CITRATE (PF) 100 MCG/2ML IJ SOLN
INTRAMUSCULAR | Status: DC | PRN
  Administered 2016-06-14: 50 ug via INTRAVENOUS

## 2016-06-14 MED ORDER — PROPOFOL 10 MG/ML IV BOLUS
INTRAVENOUS | Status: DC | PRN
  Administered 2016-06-14: 20 mg via INTRAVENOUS
  Administered 2016-06-14: 10 mg via INTRAVENOUS
  Administered 2016-06-14: 30 mg via INTRAVENOUS

## 2016-06-14 MED ORDER — LIDOCAINE HCL (PF) 2 % IJ SOLN
INTRAMUSCULAR | Status: DC | PRN
  Administered 2016-06-14: 50 mg

## 2016-06-14 MED ORDER — SODIUM CHLORIDE 0.9 % IV SOLN
INTRAVENOUS | Status: DC
  Administered 2016-06-14 (×2): via INTRAVENOUS

## 2016-06-14 MED ORDER — METHYLENE BLUE 0.5 % INJ SOLN
INTRAVENOUS | Status: AC
  Filled 2016-06-14: qty 10

## 2016-06-14 MED ORDER — PIPERACILLIN-TAZOBACTAM 3.375 G IVPB 30 MIN
3.3750 g | Freq: Once | INTRAVENOUS | Status: AC
  Administered 2016-06-14: 3.375 g via INTRAVENOUS
  Filled 2016-06-14: qty 50

## 2016-06-14 MED ORDER — MIDAZOLAM HCL 5 MG/5ML IJ SOLN
INTRAMUSCULAR | Status: DC | PRN
  Administered 2016-06-14: 1 mg via INTRAVENOUS

## 2016-06-14 NOTE — Transfer of Care (Signed)
Immediate Anesthesia Transfer of Care Note  Patient: Anthony Evans  Procedure(s) Performed: Procedure(s): COLONOSCOPY WITH PROPOFOL (N/A)  Patient Location: PACU  Anesthesia Type:General  Level of Consciousness: sedated  Airway & Oxygen Therapy: Patient Spontanous Breathing and Patient connected to nasal cannula oxygen  Post-op Assessment: Report given to RN and Post -op Vital signs reviewed and stable  Post vital signs: Reviewed and stable  Last Vitals:  Filed Vitals:   06/14/16 1004  BP: 141/86  Pulse: 75  Temp: 36.3 C  Resp: 14    Last Pain: There were no vitals filed for this visit.       Complications: No apparent anesthesia complications

## 2016-06-14 NOTE — Anesthesia Preprocedure Evaluation (Addendum)
Anesthesia Evaluation  Patient identified by MRN, date of birth, ID band Patient awake    Reviewed: Allergy & Precautions, H&P , NPO status , Patient's Chart, lab work & pertinent test results, reviewed documented beta blocker date and time   Airway Mallampati: II   Neck ROM: full    Dental  (+) Poor Dentition   Pulmonary neg pulmonary ROS,    Pulmonary exam normal        Cardiovascular hypertension, negative cardio ROS Normal cardiovascular exam     Neuro/Psych Seizures -, Well Controlled,  Neurologic dysfunction affecting the right upper extr. CVA, Residual Symptoms negative neurological ROS  negative psych ROS   GI/Hepatic negative GI ROS, Neg liver ROS, GERD  Medicated,  Endo/Other  negative endocrine ROS  Renal/GU negative Renal ROS  negative genitourinary   Musculoskeletal   Abdominal   Peds  Hematology negative hematology ROS (+) anemia ,   Anesthesia Other Findings Past Medical History:   Back pain                                                    Thyroid disease                                              Hypertension                                                 Reflux                                                       GERD (gastroesophageal reflux disease)                       Arthritis                                                      Comment:psoriatic   Degenerative disc disease                                    Thalassemia                                                  Seizures ([facility])                                                 Comment:last seizure about 10 years ago   Stroke (  [place])                                                   Comment: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 Past Surgical History:   CORRECTION HAMMER TOE                                         EYE SURGERY                                     Right                Comment:cataract   KNEE ARTHROPLASTY                                Left                Comment:total knee replacement   HIP FRACTURE SURGERY                            Right                Comment:pins in hip   CERVICAL FUSION                                               BACK SURGERY                                                    Comment:screws and plate in c4 and c5   CATARACT EXTRACTION W/PHACO                     Left 05/15/2015       Comment:Procedure: CATARACT EXTRACTION PHACO AND               INTRAOCULAR LENS PLACEMENT (IOC);  Surgeon:               Lyla Glassing, MD;  Location: [facility] ORS;                Service: Ophthalmology;  Laterality: Left;  [place]:              2:40.9 AP: 17.3 CDE: 27.88 BMI    Body Mass Index   29.41 kg/m 2     Reproductive/Obstetrics                            Anesthesia Physical Anesthesia Plan  ASA: III  Anesthesia Plan: General   Post-op Pain Management:    Induction:   Airway Management Planned:   Additional Equipment:   Intra-op Plan:   Post-operative Plan:   Informed Consent: I have reviewed the patients History and Physical, chart, labs and discussed the procedure including the risks, benefits and alternatives for the proposed  anesthesia with the patient or authorized representative who has indicated his/her understanding and acceptance.   Dental Advisory Given  Plan Discussed with: CRNA  Anesthesia Plan Comments:         Anesthesia Quick Evaluation

## 2016-06-14 NOTE — H&P (Signed)
Primary Care Physician:  Everlean Alstrom, MD Primary Gastroenterologist:  Dr. Vira Agar  Pre-Procedure History & Physical: HPI:  Anthony Evans is a 65 y.o. male is here for an colonoscopy.   Past Medical History  Diagnosis Date  . Back pain   . Thyroid disease   . Hypertension   . Reflux   . GERD (gastroesophageal reflux disease)   . Arthritis     psoriatic  . Degenerative disc disease   . Thalassemia   . Seizures ([place])     last seizure about 10 years ago  . 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 Correction hammer toe    . Eye surgery Right     cataract  . Knee arthroplasty Left     total knee replacement  . Hip fracture surgery Right     pins in hip  . Cervical fusion    . Back surgery      screws and plate in c4 and c5  . Cataract extraction w/phaco Left 05/15/2015    Procedure: CATARACT EXTRACTION PHACO AND INTRAOCULAR LENS PLACEMENT (IOC);  Surgeon: Lyla Glassing, MD;  Location: [facility] ORS;  Service: Ophthalmology;  Laterality: Left;  [place]: 2:40.9 AP: 17.3 CDE: 27.88    Prior to Admission medications   Medication Sig Start Date End Date Taking? Authorizing Provider  Calcium Carb-Cholecalciferol (CALCIUM 1000 + D PO) Take 1 tablet by mouth 2 (two) times daily.   Yes Historical Provider, MD  carbamazepine (TEGRETOL) 200 MG tablet Take 200 mg by mouth.   Yes Historical Provider, MD  cetirizine (ZYRTEC) 10 MG tablet Take 10 mg by mouth daily.   Yes Historical Provider, MD  folic acid (FOLVITE) 1 MG tablet Take 1 mg by mouth daily.   Yes Historical Provider, MD  levothyroxine (SYNTHROID, LEVOTHROID) 125 MCG tablet 175 mcg.  04/24/14  Yes Historical Provider, MD  Multiple Vitamin (MULTIVITAMIN) capsule Take 1 capsule by mouth daily.   Yes Historical Provider, MD  Omega-3 Fatty Acids (FISH OIL ULTRA) 1000 MG CAPS Take by mouth.   Yes Historical Provider, MD  primidone (MYSOLINE) 250 MG tablet Take 250 mg by mouth 4  (four) times daily.   Yes Historical Provider, MD  triamterene-hydrochlorothiazide (MAXZIDE) 75-50 MG per tablet  04/24/14  Yes Historical Provider, MD  vitamin B-12 (CYANOCOBALAMIN) 1000 MCG tablet Take 1,000 mcg by mouth daily.   Yes Historical Provider, MD  acetaZOLAMIDE (DIAMOX) 125 MG tablet Take 2 tablets (250 mg total) by mouth 2 (two) times daily. 05/15/15   Lyla Glassing, MD  cyclobenzaprine (FLEXERIL) 5 MG tablet  04/20/14   Historical Provider, MD  docusate sodium (COLACE) 100 MG capsule Take 100 mg by mouth 2 (two) times daily.    Historical Provider, MD  erythromycin ophthalmic ointment 1 application 2 (two) times daily.    Historical Provider, MD  gabapentin (NEURONTIN) 300 MG capsule Reported on 06/14/2016 03/25/14   Historical Provider, MD  gemfibrozil (LOPID) 600 MG tablet Take 600 mg by mouth 2 (two) times daily before a meal.    Historical Provider, MD  lansoprazole (PREVACID) 30 MG capsule Reported on 06/14/2016 04/13/14   Historical Provider, MD  triamcinolone cream (KENALOG) 0.1 %  04/20/14   Historical Provider, MD    Allergies as of 05/19/2016 - Review Complete 12/02/2015  Allergen Reaction Noted  . Etanercept Nausea And Vomiting 04/09/2015  . Etodolac Nausea And Vomiting 04/09/2015  .  Latex Rash 05/17/2014  . Rubbing alcohol [isopropyl alcohol] Rash 05/12/2015    History reviewed. No pertinent family history.  Social History   Social History  . Marital Status: Single    Spouse Name: N/A  . Number of Children: N/A  . Years of Education: N/A   Occupational History  . Not on file.   Social History Main Topics  . Smoking status: Never Smoker   . Smokeless tobacco: Never Used  . Alcohol Use: No  . Drug Use: Not on file  . Sexual Activity: Not on file   Other Topics Concern  . Not on file   Social History Narrative    Review of Systems: See HPI, otherwise negative ROS  Physical Exam: BP 141/86 mmHg  Pulse 75  Temp(Src) 97.4 F (36.3 C) (Tympanic)   Resp 14  Ht 5\' 10"  (1.778 m)  Wt 92.987 kg (205 lb)  BMI 29.41 kg/m2  SpO2 100% General:   Alert,  pleasant and cooperative in NAD Head:  Normocephalic and atraumatic. Neck:  Supple; no masses or thyromegaly. Lungs:  Clear throughout to auscultation.    Heart:  Regular rate and rhythm. Abdomen:  Soft, nontender and nondistended. Normal bowel sounds, without guarding, and without rebound.   Neurologic:  Alert and  oriented x4;  grossly normal neurologically.  Impression/Plan: Anthony Evans is here for an colonoscopy to be performed for [facility] colon polyps  Risks, benefits, limitations, and alternatives regarding  colonoscopy have been reviewed with the patient.  Questions have been answered.  All parties agreeable.   Gaylyn Cheers, MD  06/14/2016, 10:37 AM

## 2016-06-14 NOTE — Op Note (Signed)
[facility] Patient Name: Anthony Evans Procedure Date: 06/14/2016 10:40 AM MRN: [id number] Account #: [id number] Date of Birth: 01-26-1951 Admit Type: Outpatient Age: 65 Room: [facility] ENDO ROOM 4 Gender: Male Note Status: Finalized Procedure:            Colonoscopy Indications:          High risk colon cancer surveillance: Personal history                        of colonic polyps Providers:            Manya Silvas, MD Referring MD:         Shepard General, MD (Referring MD) Medicines:            Propofol per Anesthesia Complications:        No immediate complications. Procedure:            Pre-Anesthesia Assessment:                       - After reviewing the risks and benefits, the patient                        was deemed in satisfactory condition to undergo the                        procedure.                       After obtaining informed consent, the colonoscope was                        passed under direct vision. Throughout the procedure,                        the patient's blood pressure, pulse, and oxygen                        saturations were monitored continuously. The                        Colonoscope was introduced through the anus and                        advanced to the the cecum, identified by appendiceal                        orifice and ileocecal valve. The colonoscopy was                        performed without difficulty. The patient tolerated the                        procedure well. The quality of the bowel preparation                        was adequate to identify polyps. Findings:      Three sessile polyps were found in the rectum and sigmoid colon. The       polyps were diminutive in size. These polyps were removed with a jumbo       cold forceps. Resection and retrieval were complete.  A small polyp was found in the hepatic flexure. The polyp was sessile.       The polyp was removed with a hot snare.  Resection and retrieval were       complete.      Five sessile polyps were found in the cecum. The polyps were small in       size. These polyps were removed with a hot snare. Resection and       retrieval were complete.      A 20 mm polyp was found in the cecum. The polyp was sessile. The polyp       was removed with a hot snare. Resection and retrieval were complete. To       prevent bleeding after the polypectomy, four hemostatic clips were       successfully placed. There was no bleeding at the end of the procedure.       Saline lift done with saline and methylene blue, removed in 2 snare       bites.      Internal hemorrhoids were found during endoscopy. The hemorrhoids were       small and Grade I (internal hemorrhoids that do not prolapse). Impression:           - Three diminutive polyps in the rectum and in the                        sigmoid colon, removed with a jumbo cold forceps.                        Resected and retrieved.                       - One small polyp at the hepatic flexure, removed with                        a hot snare. Resected and retrieved.                       - Five small polyps in the cecum, removed with a hot                        snare. Resected and retrieved.                       - One 20 mm polyp in the cecum, removed with a hot                        snare. Resected and retrieved. Clips were placed.                       - Internal hemorrhoids. Recommendation:       - Await pathology results. Manya Silvas, MD 06/14/2016 11:58:55 AM This report has been signed electronically. Number of Addenda: 0 Note Initiated On: 06/14/2016 10:40 AM Scope Withdrawal Time: 0 hours 44 minutes 54 seconds  Total Procedure Duration: 1 hour 5 minutes 22 seconds       [facility]

## 2016-06-14 NOTE — Anesthesia Postprocedure Evaluation (Signed)
Anesthesia Post Note  Patient: Anthony Evans  Procedure(s) Performed: Procedure(s) (LRB): COLONOSCOPY WITH PROPOFOL (N/A)  Patient location during evaluation: PACU Anesthesia Type: General Level of consciousness: awake and alert Pain management: pain level controlled Vital Signs Assessment: post-procedure vital signs reviewed and stable Respiratory status: spontaneous breathing, nonlabored ventilation, respiratory function stable and patient connected to nasal cannula oxygen Cardiovascular status: blood pressure returned to baseline and stable Postop Assessment: no signs of nausea or vomiting Anesthetic complications: no    Last Vitals:  Filed Vitals:   06/14/16 1215 06/14/16 1225  BP: 130/94 129/85  Pulse: 75 74  Temp:    Resp: 9 8    Last Pain: There were no vitals filed for this visit.               Molli Barrows

## 2016-06-15 ENCOUNTER — Encounter: Payer: Self-pay | Admitting: Unknown Physician Specialty

## 2016-06-15 LAB — SURGICAL PATHOLOGY

## 2017-10-10 ENCOUNTER — Other Ambulatory Visit: Payer: Self-pay | Admitting: General Practice

## 2017-10-10 ENCOUNTER — Ambulatory Visit
Admission: RE | Admit: 2017-10-10 | Discharge: 2017-10-10 | Disposition: A | Discharge status: Home / Self Care | Payer: Disability Insurance | Source: Ambulatory Visit | Attending: General Practice | Admitting: General Practice

## 2017-10-10 DIAGNOSIS — M5136 Other intervertebral disc degeneration, lumbar region: Secondary | ICD-10-CM | POA: Diagnosis not present

## 2017-10-10 DIAGNOSIS — D569 Thalassemia, unspecified: Secondary | ICD-10-CM | POA: Insufficient documentation

## 2017-10-10 DIAGNOSIS — I69359 Hemiplegia and hemiparesis following cerebral infarction affecting unspecified side: Secondary | ICD-10-CM | POA: Insufficient documentation

## 2017-10-10 DIAGNOSIS — G8929 Other chronic pain: Secondary | ICD-10-CM | POA: Insufficient documentation

## 2017-10-10 DIAGNOSIS — M549 Dorsalgia, unspecified: Secondary | ICD-10-CM

## 2017-10-10 DIAGNOSIS — D649 Anemia, unspecified: Secondary | ICD-10-CM | POA: Insufficient documentation

## 2017-10-10 DIAGNOSIS — M5186 Other intervertebral disc disorders, lumbar region: Secondary | ICD-10-CM | POA: Insufficient documentation

## 2017-10-10 DIAGNOSIS — L4050 Arthropathic psoriasis, unspecified: Secondary | ICD-10-CM | POA: Diagnosis not present

## 2017-10-10 DIAGNOSIS — M1990 Unspecified osteoarthritis, unspecified site: Secondary | ICD-10-CM

## 2017-10-10 DIAGNOSIS — M4185 Other forms of scoliosis, thoracolumbar region: Secondary | ICD-10-CM | POA: Diagnosis not present

## 2018-03-03 ENCOUNTER — Encounter: Payer: Self-pay | Admitting: Physician Assistant

## 2018-03-03 ENCOUNTER — Ambulatory Visit (INDEPENDENT_AMBULATORY_CARE_PROVIDER_SITE_OTHER): Payer: BLUE CROSS/BLUE SHIELD | Admitting: Physician Assistant

## 2018-03-03 VITALS — BP 120/62 | HR 85 | Temp 98.3°F | Resp 16 | Ht 69.0 in | Wt 198.0 lb

## 2018-03-03 DIAGNOSIS — E079 Disorder of thyroid, unspecified: Secondary | ICD-10-CM

## 2018-03-03 DIAGNOSIS — K2100 Gastro-esophageal reflux disease with esophagitis, without bleeding: Secondary | ICD-10-CM

## 2018-03-03 DIAGNOSIS — R569 Unspecified convulsions: Secondary | ICD-10-CM

## 2018-03-03 DIAGNOSIS — M5442 Lumbago with sciatica, left side: Secondary | ICD-10-CM

## 2018-03-03 DIAGNOSIS — L4050 Arthropathic psoriasis, unspecified: Secondary | ICD-10-CM

## 2018-03-03 DIAGNOSIS — Z8669 Personal history of other diseases of the nervous system and sense organs: Secondary | ICD-10-CM

## 2018-03-03 DIAGNOSIS — D569 Thalassemia, unspecified: Secondary | ICD-10-CM

## 2018-03-03 DIAGNOSIS — I10 Essential (primary) hypertension: Secondary | ICD-10-CM

## 2018-03-03 DIAGNOSIS — J302 Other seasonal allergic rhinitis: Secondary | ICD-10-CM

## 2018-03-03 DIAGNOSIS — M5137 Other intervertebral disc degeneration, lumbosacral region: Secondary | ICD-10-CM

## 2018-03-03 DIAGNOSIS — Z8673 Personal history of transient ischemic attack (TIA), and cerebral infarction without residual deficits: Secondary | ICD-10-CM

## 2018-03-03 DIAGNOSIS — G8929 Other chronic pain: Secondary | ICD-10-CM

## 2018-03-03 DIAGNOSIS — M5441 Lumbago with sciatica, right side: Secondary | ICD-10-CM

## 2018-03-03 NOTE — Progress Notes (Signed)
Patient: Anthony Evans, Male    DOB: [birth year], 67 y.o.   MRN: [id number] Visit Date: 03/03/2018  Today's Provider: Mar Daring, PA-C   Chief Complaint  Patient presents with  . New Patient (Initial Visit)  . Establish Care   Subjective:    Establish Care: Anthony Evans is a 67 y.o. male who presents today to establish care. He was previously seen at the [facility] [employer]. He is going to see them one last time in May for his CPE and labs. These will be faxed to our office. He will then go on to Medicare in 07/2018 when he turns 67. At that time he will follow up here for his Initial Welcome to Medicare physical.   He does have psoriatic arthritis. He is followed by Dr. Lindon Romp with [facility]. He is stable on Methotrexate 2.5mg  (4 tabs PO q 7 days), meloxicam 7.5mg  once to twice daily, Hydroxychloroquine 200mg  daily, clobestasol 0.05% cream, hydrocortisone 2.5% ointment.  He also has hypothyroidism and is stable on levothyroxine 14mcg.   He does have chronic back pain and is followed by Dr. Sharlet Salina. Has had back surgeries with Dr. Joya Salm.    He also has history of stroke, seizures and HTN medically managed.    Review of Systems  Constitutional: Negative.   HENT: Negative.   Eyes: Positive for discharge and itching.  Respiratory: Negative.   Cardiovascular: Negative.   Gastrointestinal: Negative.   Endocrine: Negative.   Genitourinary: Negative.   Musculoskeletal: Positive for arthralgias, back pain, gait problem, joint swelling and myalgias.  Skin: Negative.   Allergic/Immunologic: Negative.   Neurological: Positive for seizures.  Hematological: Negative.   Psychiatric/Behavioral: Negative.     Social History      He  reports that he has never smoked. He has never used smokeless tobacco. He reports that he does not drink alcohol.       Social History   Socioeconomic History  . Marital status: Single    Spouse  name: Not on file  . Number of children: Not on file  . Years of education: Not on file  . Highest education level: Not on file  Occupational History  . Not on file  Social Needs  . Financial resource strain: Not on file  . Food insecurity:    Worry: Not on file    Inability: Not on file  . Transportation needs:    Medical: Not on file    Non-medical: Not on file  Tobacco Use  . Smoking status: Never Smoker  . Smokeless tobacco: Never Used  Substance and Sexual Activity  . Alcohol use: No  . Drug use: Not on file  . Sexual activity: Not on file  Lifestyle  . Physical activity:    Days per week: Not on file    Minutes per session: Not on file  . Stress: Not on file  Relationships  . Social connections:    Talks on phone: Not on file    Gets together: Not on file    Attends religious service: Not on file    Active member of club or organization: Not on file    Attends meetings of clubs or organizations: Not on file    Relationship status: Not on file  Other Topics Concern  . Not on file  Social History Narrative  . Not on file    Past Medical History:  Diagnosis Date  . Allergy   .  Arthritis    psoriatic  . Back pain   . Cataract   . Degenerative disc disease   . GERD (gastroesophageal reflux disease)   . Hypertension   . Reflux   . Seizures ([place])    last seizure about 10 years ago  . 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 Thalassemia   . Thyroid disease      There are no active problems to display for this patient.   Past Surgical History:  Procedure Laterality Date  . BACK SURGERY     screws and plate in c4 and c5  . CATARACT EXTRACTION W/PHACO Left 05/15/2015   Procedure: CATARACT EXTRACTION PHACO AND INTRAOCULAR LENS PLACEMENT (IOC);  Surgeon: Lyla Glassing, MD;  Location: [facility] ORS;  Service: Ophthalmology;  Laterality: Left;  [place]: 2:40.9 AP: 17.3 CDE: 27.88  . CERVICAL FUSION    . COLONOSCOPY WITH PROPOFOL N/A 06/14/2016   Procedure:  COLONOSCOPY WITH PROPOFOL;  Surgeon: Manya Silvas, MD;  Location: [facility] ENDOSCOPY;  Service: Endoscopy;  Laterality: N/A;  . CORRECTION HAMMER TOE    . EYE SURGERY Right    cataract  . FRACTURE SURGERY    . HIP FRACTURE SURGERY Right    pins in hip  . JOINT REPLACEMENT    . KNEE ARTHROPLASTY Left    total knee replacement    Family History        Family Status  Relation Name Status  . Mother  Deceased  . Father  Deceased  . Other  (Not Specified)        His family history includes Arthritis in his other; Cancer in his other; Diabetes in his other.      Allergies  Allergen Reactions  . Etanercept Nausea And Vomiting  . Etodolac Nausea And Vomiting  . Latex Rash  . Rubbing Alcohol [Isopropyl Alcohol] Rash     Current Outpatient Medications:  .  Alpha-Lipoic Acid 300 MG CAPS, Take by mouth., Disp: , Rfl:  .  Calcium Carb-Cholecalciferol (CALCIUM 1000 + D PO), Take 1 tablet by mouth 2 (two) times daily., Disp: , Rfl:  .  carbamazepine (TEGRETOL) 200 MG tablet, Take 200 mg by mouth., Disp: , Rfl:  .  cetirizine (ZYRTEC) 10 MG tablet, Take 10 mg by mouth daily., Disp: , Rfl:  .  Clobetasol Prop Emollient Base (CLOBETASOL PROPIONATE E) 0.05 % emollient cream, Apply topically 2 (two) times daily., Disp: , Rfl:  .  cyclobenzaprine (FLEXERIL) 5 MG tablet, , Disp: , Rfl:  .  docusate sodium (COLACE) 100 MG capsule, Take 100 mg by mouth 2 (two) times daily., Disp: , Rfl:  .  erythromycin ophthalmic ointment, 1 application 2 (two) times daily., Disp: , Rfl:  .  Flaxseed, Linseed, (FLAX SEED OIL) 1000 MG CAPS, Take by mouth., Disp: , Rfl:  .  folic acid (FOLVITE) 1 MG tablet, Take 1 mg by mouth daily., Disp: , Rfl:  .  HYDROcodone-acetaminophen (NORCO/VICODIN) 5-325 MG tablet, Take by mouth., Disp: , Rfl:  .  hydrocortisone 2.5 % ointment, Apply topically as needed., Disp: , Rfl:  .  hydroxychloroquine (PLAQUENIL) 200 MG tablet, TAKE 1 TABLET (200MG  TOTAL) BY MOUTH ONCE DAILY, Disp:  , Rfl:  .  levothyroxine (SYNTHROID, LEVOTHROID) 125 MCG tablet, 175 mcg. , Disp: , Rfl:  .  meloxicam (MOBIC) 7.5 MG tablet, TAKE 1 TABLET BY MOUTH TWICE A DAY, Disp: , Rfl:  .  methotrexate (RHEUMATREX) 2.5 MG tablet, TAKE 4 TABLETS BY MOUTH  EVERY 7 DAYS WITH A MEAL, Disp: , Rfl:  .  primidone (MYSOLINE) 250 MG tablet, Take 250 mg by mouth 4 (four) times daily., Disp: , Rfl:  .  raNITIdine HCl (RANITIDINE 75 PO), Take by mouth., Disp: , Rfl:  .  timolol (TIMOPTIC) 0.5 % ophthalmic solution, , Disp: , Rfl: 2 .  triamcinolone cream (KENALOG) 0.1 %, , Disp: , Rfl:  .  triamterene-hydrochlorothiazide (MAXZIDE) 75-50 MG per tablet, , Disp: , Rfl:  .  vitamin B-12 (CYANOCOBALAMIN) 1000 MCG tablet, Take 1,000 mcg by mouth daily., Disp: , Rfl:  .  vitamin E 400 UNIT capsule, Take by mouth., Disp: , Rfl:  .  acetaZOLAMIDE (DIAMOX) 125 MG tablet, Take 2 tablets (250 mg total) by mouth 2 (two) times daily. (Patient not taking: Reported on 03/03/2018), Disp: 10 tablet, Rfl: 1 .  gabapentin (NEURONTIN) 300 MG capsule, Reported on 06/14/2016, Disp: , Rfl:  .  gemfibrozil (LOPID) 600 MG tablet, Take 600 mg by mouth 2 (two) times daily before a meal., Disp: , Rfl:    Patient Care Team: Mar Daring, PA-C as PCP - General (Family Medicine)      Objective:   Vitals: BP 120/62 (BP Location: Left Arm, Patient Position: Sitting, Cuff Size: Normal)   Pulse 85   Temp 98.3 F (36.8 C) (Oral)   Resp 16   Ht 5\' 9"  (1.753 m)   Wt 198 lb (89.8 kg)   SpO2 97%   BMI 29.24 kg/m     Physical Exam  Constitutional: He appears well-developed and well-nourished. No distress.  HENT:  Head: Normocephalic and atraumatic.  Right Ear: Hearing, tympanic membrane, external ear and ear canal normal. Tympanic membrane is not erythematous and not bulging. No middle ear effusion.  Left Ear: Hearing, tympanic membrane, external ear and ear canal normal. Tympanic membrane is not erythematous and not bulging.  No middle  ear effusion.  Nose: Mucosal edema and rhinorrhea present. Right sinus exhibits no maxillary sinus tenderness and no frontal sinus tenderness. Left sinus exhibits no maxillary sinus tenderness and no frontal sinus tenderness.  Mouth/Throat: Uvula is midline, oropharynx is clear and moist and mucous membranes are normal. No oropharyngeal exudate, posterior oropharyngeal edema or posterior oropharyngeal erythema.  Eyes: Pupils are equal, round, and reactive to light. Conjunctivae and EOM are normal. Right eye exhibits no discharge. Left eye exhibits no discharge.  Neck: Normal range of motion. Neck supple. No tracheal deviation present. No Brudzinski's sign and no Kernig's sign noted. No thyromegaly present.  Cardiovascular: Normal rate, regular rhythm and normal heart sounds. Exam reveals no gallop and no friction rub.  No murmur heard. Pulmonary/Chest: Effort normal and breath sounds normal. No stridor. No respiratory distress. He has no wheezes. He has no rales.  Musculoskeletal: He exhibits no edema.  Lymphadenopathy:    He has no cervical adenopathy.  Skin: Skin is warm and dry. He is not diaphoretic.  Vitals reviewed.    Depression Screen PHQ 2/9 Scores 03/03/2018  PHQ - 2 Score 0      Assessment & Plan:     Routine Health Maintenance and Physical Exam  Exercise Activities and Dietary recommendations Goals    None       There is no immunization history on file for this patient.  Health Maintenance  Topic Date Due  . Hepatitis C Screening  [birth year]  . HIV Screening  07/29/1968  . TETANUS/TDAP  07/29/1972  . INFLUENZA VACCINE  07/06/2017  . COLONOSCOPY  06/14/2026  Discussed health benefits of physical activity, and encouraged him to engage in regular exercise appropriate for his age and condition.  1. Encounter to establish care Previously followed by [facility].  2. Essential hypertension Stable. Continue Maxzide 75-50mg .  3. History of  CVA (cerebrovascular accident) Stable.   4. Gastroesophageal reflux disease with esophagitis Stable on Ranitidine 150mg  daily. Recently saw GI.   5. Thyroid disease Stable on Levothyroxine 161mcg.  6. Degeneration of intervertebral disc of lumbosacral region Stable on Hydrocodone-apap 5-325mg  and gabapentin 300mg . Followed by Dr. Sharlet Salina.   7. Psoriatic arthritis (HCC) Stable on plaquenil, methotrexate and topical ointments. Followed by Dr. Lindon Romp.   8. Chronic midline low back pain with bilateral sciatica See above medical treatment plan for #6.  9. Thalassemia, unspecified type Stable. No treatment required.   10. Seizures ([place]) Stable on Tegretol (requires brand-had breakthrough seizures on generic).  11. H/O cataract Removed last year.   12. Seasonal allergies Stable on Zyrtec currently.   --------------------------------------------------------------------    Mar Daring, PA-C  [facility]

## 2018-03-08 ENCOUNTER — Encounter: Payer: Self-pay | Admitting: Physician Assistant

## 2018-03-08 DIAGNOSIS — G40909 Epilepsy, unspecified, not intractable, without status epilepticus: Secondary | ICD-10-CM | POA: Insufficient documentation

## 2018-03-08 DIAGNOSIS — R569 Unspecified convulsions: Secondary | ICD-10-CM | POA: Insufficient documentation

## 2018-03-08 DIAGNOSIS — Z8673 Personal history of transient ischemic attack (TIA), and cerebral infarction without residual deficits: Secondary | ICD-10-CM | POA: Insufficient documentation

## 2018-03-08 DIAGNOSIS — IMO0002 Reserved for concepts with insufficient information to code with codable children: Secondary | ICD-10-CM | POA: Insufficient documentation

## 2018-03-08 DIAGNOSIS — M549 Dorsalgia, unspecified: Secondary | ICD-10-CM | POA: Insufficient documentation

## 2018-03-08 DIAGNOSIS — I10 Essential (primary) hypertension: Secondary | ICD-10-CM | POA: Insufficient documentation

## 2018-03-08 DIAGNOSIS — K219 Gastro-esophageal reflux disease without esophagitis: Secondary | ICD-10-CM | POA: Insufficient documentation

## 2018-03-08 DIAGNOSIS — L4050 Arthropathic psoriasis, unspecified: Secondary | ICD-10-CM | POA: Insufficient documentation

## 2018-03-08 DIAGNOSIS — J302 Other seasonal allergic rhinitis: Secondary | ICD-10-CM | POA: Insufficient documentation

## 2018-03-08 DIAGNOSIS — Z8669 Personal history of other diseases of the nervous system and sense organs: Secondary | ICD-10-CM | POA: Insufficient documentation

## 2018-03-08 DIAGNOSIS — D563 Thalassemia minor: Secondary | ICD-10-CM | POA: Insufficient documentation

## 2018-03-08 DIAGNOSIS — E079 Disorder of thyroid, unspecified: Secondary | ICD-10-CM | POA: Insufficient documentation

## 2018-09-14 ENCOUNTER — Emergency Department: Payer: Medicare Other

## 2018-09-14 ENCOUNTER — Observation Stay
Admission: EM | Admit: 2018-09-14 | Discharge: 2018-09-15 | Disposition: A | Discharge status: Home / Self Care | Payer: Medicare Other | Attending: Internal Medicine | Admitting: Internal Medicine

## 2018-09-14 ENCOUNTER — Other Ambulatory Visit: Payer: Self-pay

## 2018-09-14 DIAGNOSIS — M25551 Pain in right hip: Secondary | ICD-10-CM | POA: Diagnosis not present

## 2018-09-14 DIAGNOSIS — Z96652 Presence of left artificial knee joint: Secondary | ICD-10-CM | POA: Insufficient documentation

## 2018-09-14 DIAGNOSIS — Z79899 Other long term (current) drug therapy: Secondary | ICD-10-CM | POA: Insufficient documentation

## 2018-09-14 DIAGNOSIS — Z79890 Hormone replacement therapy (postmenopausal): Secondary | ICD-10-CM | POA: Insufficient documentation

## 2018-09-14 DIAGNOSIS — M542 Cervicalgia: Secondary | ICD-10-CM | POA: Diagnosis present

## 2018-09-14 DIAGNOSIS — J209 Acute bronchitis, unspecified: Secondary | ICD-10-CM | POA: Diagnosis not present

## 2018-09-14 DIAGNOSIS — W1830XA Fall on same level, unspecified, initial encounter: Secondary | ICD-10-CM | POA: Diagnosis not present

## 2018-09-14 DIAGNOSIS — G40909 Epilepsy, unspecified, not intractable, without status epilepticus: Secondary | ICD-10-CM | POA: Insufficient documentation

## 2018-09-14 DIAGNOSIS — R339 Retention of urine, unspecified: Secondary | ICD-10-CM | POA: Insufficient documentation

## 2018-09-14 DIAGNOSIS — K219 Gastro-esophageal reflux disease without esophagitis: Secondary | ICD-10-CM | POA: Insufficient documentation

## 2018-09-14 DIAGNOSIS — W19XXXA Unspecified fall, initial encounter: Secondary | ICD-10-CM

## 2018-09-14 DIAGNOSIS — R0602 Shortness of breath: Secondary | ICD-10-CM

## 2018-09-14 DIAGNOSIS — Z791 Long term (current) use of non-steroidal anti-inflammatories (NSAID): Secondary | ICD-10-CM | POA: Diagnosis not present

## 2018-09-14 DIAGNOSIS — L4050 Arthropathic psoriasis, unspecified: Secondary | ICD-10-CM | POA: Diagnosis not present

## 2018-09-14 DIAGNOSIS — S12101A Unspecified nondisplaced fracture of second cervical vertebra, initial encounter for closed fracture: Principal | ICD-10-CM

## 2018-09-14 DIAGNOSIS — R05 Cough: Secondary | ICD-10-CM | POA: Insufficient documentation

## 2018-09-14 DIAGNOSIS — S0990XA Unspecified injury of head, initial encounter: Secondary | ICD-10-CM

## 2018-09-14 DIAGNOSIS — I10 Essential (primary) hypertension: Secondary | ICD-10-CM | POA: Diagnosis not present

## 2018-09-14 DIAGNOSIS — Z8673 Personal history of transient ischemic attack (TIA), and cerebral infarction without residual deficits: Secondary | ICD-10-CM | POA: Insufficient documentation

## 2018-09-14 DIAGNOSIS — E039 Hypothyroidism, unspecified: Secondary | ICD-10-CM | POA: Diagnosis not present

## 2018-09-14 DIAGNOSIS — M6282 Rhabdomyolysis: Secondary | ICD-10-CM | POA: Insufficient documentation

## 2018-09-14 DIAGNOSIS — S01111A Laceration without foreign body of right eyelid and periocular area, initial encounter: Secondary | ICD-10-CM | POA: Insufficient documentation

## 2018-09-14 DIAGNOSIS — S12100A Unspecified displaced fracture of second cervical vertebra, initial encounter for closed fracture: Secondary | ICD-10-CM | POA: Diagnosis present

## 2018-09-14 LAB — COMPREHENSIVE METABOLIC PANEL
ALT: 18 U/L (ref 0–44)
AST: 61 U/L — ABNORMAL HIGH (ref 15–41)
Albumin: 4.6 g/dL (ref 3.5–5.0)
Alkaline Phosphatase: 64 U/L (ref 38–126)
Anion gap: 14 (ref 5–15)
BUN: 16 mg/dL (ref 8–23)
CHLORIDE: 99 mmol/L (ref 98–111)
CO2: 26 mmol/L (ref 22–32)
Calcium: 8.9 mg/dL (ref 8.9–10.3)
Creatinine, Ser: 0.63 mg/dL (ref 0.61–1.24)
Glucose, Bld: 120 mg/dL — ABNORMAL HIGH (ref 70–99)
POTASSIUM: 4.8 mmol/L (ref 3.5–5.1)
Sodium: 139 mmol/L (ref 135–145)
TOTAL PROTEIN: 7.7 g/dL (ref 6.5–8.1)
Total Bilirubin: 1.5 mg/dL — ABNORMAL HIGH (ref 0.3–1.2)

## 2018-09-14 LAB — CBC WITH DIFFERENTIAL/PLATELET
Abs Immature Granulocytes: 0.03 10*3/uL (ref 0.00–0.07)
BASOS PCT: 0 %
Basophils Absolute: 0 10*3/uL (ref 0.0–0.1)
EOS ABS: 0.1 10*3/uL (ref 0.0–0.5)
EOS PCT: 2 %
HCT: 31.6 % — ABNORMAL LOW (ref 39.0–52.0)
Hemoglobin: 9.8 g/dL — ABNORMAL LOW (ref 13.0–17.0)
IMMATURE GRANULOCYTES: 0 %
Lymphocytes Relative: 6 %
Lymphs Abs: 0.5 10*3/uL — ABNORMAL LOW (ref 0.7–4.0)
MCH: 22.6 pg — AB (ref 26.0–34.0)
MCHC: 31 g/dL (ref 30.0–36.0)
MCV: 73 fL — AB (ref 80.0–100.0)
MONOS PCT: 9 %
Monocytes Absolute: 0.7 10*3/uL (ref 0.1–1.0)
NEUTROS PCT: 83 %
Neutro Abs: 6.6 10*3/uL (ref 1.7–7.7)
PLATELETS: 224 10*3/uL (ref 150–400)
RBC: 4.33 MIL/uL (ref 4.22–5.81)
RDW: 18.6 % — AB (ref 11.5–15.5)
WBC: 7.9 10*3/uL (ref 4.0–10.5)
nRBC: 0.5 % — ABNORMAL HIGH (ref 0.0–0.2)

## 2018-09-14 LAB — URINALYSIS, COMPLETE (UACMP) WITH MICROSCOPIC
Bilirubin Urine: NEGATIVE
GLUCOSE, UA: NEGATIVE mg/dL
HGB URINE DIPSTICK: NEGATIVE
KETONES UR: NEGATIVE mg/dL
LEUKOCYTES UA: NEGATIVE
Nitrite: NEGATIVE
PROTEIN: 30 mg/dL — AB
SPECIFIC GRAVITY, URINE: 1.024 (ref 1.005–1.030)
pH: 5 (ref 5.0–8.0)

## 2018-09-14 LAB — CARBAMAZEPINE LEVEL, TOTAL: CARBAMAZEPINE LVL: 6.5 ug/mL (ref 4.0–12.0)

## 2018-09-14 LAB — CK: Total CK: 363 U/L (ref 49–397)

## 2018-09-14 LAB — TROPONIN I

## 2018-09-14 MED ORDER — TIMOLOL MALEATE 0.5 % OP SOLN
1.0000 [drp] | Freq: Two times a day (BID) | OPHTHALMIC | Status: DC
  Administered 2018-09-14 – 2018-09-15 (×2): 1 [drp] via OPHTHALMIC
  Filled 2018-09-14: qty 5

## 2018-09-14 MED ORDER — TRIAMTERENE-HCTZ 37.5-25 MG PO TABS
2.0000 | ORAL_TABLET | Freq: Every day | ORAL | Status: DC
  Administered 2018-09-15: 2 via ORAL
  Filled 2018-09-14 (×2): qty 2

## 2018-09-14 MED ORDER — HYDROXYCHLOROQUINE SULFATE 200 MG PO TABS
200.0000 mg | ORAL_TABLET | Freq: Every day | ORAL | Status: DC
  Administered 2018-09-15: 200 mg via ORAL
  Filled 2018-09-14: qty 1

## 2018-09-14 MED ORDER — TRIAMTERENE-HCTZ 75-50 MG PO TABS
1.0000 | ORAL_TABLET | Freq: Every day | ORAL | Status: DC
  Filled 2018-09-14: qty 1

## 2018-09-14 MED ORDER — ACETAMINOPHEN 325 MG PO TABS
650.0000 mg | ORAL_TABLET | Freq: Four times a day (QID) | ORAL | Status: DC | PRN
  Administered 2018-09-14: 650 mg via ORAL
  Filled 2018-09-14: qty 2

## 2018-09-14 MED ORDER — ACETAMINOPHEN 650 MG RE SUPP: 650.0000 mg | Freq: Four times a day (QID) | RECTAL | Status: DC | PRN

## 2018-09-14 MED ORDER — ALBUTEROL SULFATE (2.5 MG/3ML) 0.083% IN NEBU: 2.5000 mg | INHALATION_SOLUTION | RESPIRATORY_TRACT | Status: DC | PRN

## 2018-09-14 MED ORDER — CARBAMAZEPINE 200 MG PO TABS
600.0000 mg | ORAL_TABLET | Freq: Every day | ORAL | Status: DC
  Administered 2018-09-14: 600 mg via ORAL
  Filled 2018-09-14 (×2): qty 3

## 2018-09-14 MED ORDER — ONDANSETRON HCL 4 MG/2ML IJ SOLN: 4.0000 mg | Freq: Four times a day (QID) | INTRAMUSCULAR | Status: DC | PRN

## 2018-09-14 MED ORDER — ONDANSETRON HCL 4 MG PO TABS: 4.0000 mg | ORAL_TABLET | Freq: Four times a day (QID) | ORAL | Status: DC | PRN

## 2018-09-14 MED ORDER — LEVOTHYROXINE SODIUM 50 MCG PO TABS
175.0000 ug | ORAL_TABLET | Freq: Every day | ORAL | Status: DC
  Administered 2018-09-15: 175 ug via ORAL
  Filled 2018-09-14: qty 1

## 2018-09-14 MED ORDER — PRIMIDONE 250 MG PO TABS
250.0000 mg | ORAL_TABLET | Freq: Three times a day (TID) | ORAL | Status: DC
  Administered 2018-09-14 – 2018-09-15 (×3): 250 mg via ORAL
  Filled 2018-09-14 (×6): qty 1

## 2018-09-14 MED ORDER — SENNOSIDES-DOCUSATE SODIUM 8.6-50 MG PO TABS: 1.0000 | ORAL_TABLET | Freq: Every evening | ORAL | Status: DC | PRN

## 2018-09-14 MED ORDER — PANTOPRAZOLE SODIUM 40 MG PO TBEC
40.0000 mg | DELAYED_RELEASE_TABLET | Freq: Every day | ORAL | Status: DC
  Administered 2018-09-15: 40 mg via ORAL
  Filled 2018-09-14: qty 1

## 2018-09-14 MED ORDER — BISACODYL 5 MG PO TBEC: 5.0000 mg | DELAYED_RELEASE_TABLET | Freq: Every day | ORAL | Status: DC | PRN

## 2018-09-14 MED ORDER — HYDROCODONE-ACETAMINOPHEN 5-325 MG PO TABS: 1.0000 | ORAL_TABLET | ORAL | Status: DC | PRN

## 2018-09-14 MED ORDER — FOLIC ACID 1 MG PO TABS
1.0000 mg | ORAL_TABLET | Freq: Every day | ORAL | Status: DC
  Administered 2018-09-15: 1 mg via ORAL
  Filled 2018-09-14: qty 1

## 2018-09-14 MED ORDER — ENOXAPARIN SODIUM 40 MG/0.4ML ~~LOC~~ SOLN
40.0000 mg | SUBCUTANEOUS | Status: DC
  Administered 2018-09-14: 40 mg via SUBCUTANEOUS
  Filled 2018-09-14: qty 0.4

## 2018-09-14 MED ORDER — CYCLOBENZAPRINE HCL 10 MG PO TABS: 5.0000 mg | ORAL_TABLET | Freq: Three times a day (TID) | ORAL | Status: DC | PRN

## 2018-09-14 MED ORDER — SODIUM CHLORIDE 0.9 % IV SOLN
INTRAVENOUS | Status: DC
  Administered 2018-09-14 – 2018-09-15 (×2): via INTRAVENOUS

## 2018-09-14 MED ORDER — CARBAMAZEPINE 200 MG PO TABS
400.0000 mg | ORAL_TABLET | Freq: Three times a day (TID) | ORAL | Status: DC
  Administered 2018-09-14 – 2018-09-15 (×3): 400 mg via ORAL
  Filled 2018-09-14 (×5): qty 2

## 2018-09-14 NOTE — Progress Notes (Signed)
Advanced Care Plan.  Purpose of Encounter: CODE STATUS. Parties in Attendance: The patient, his sister, brother-in-law and me. Patient's Decisional Capacity: Yes. Medical Story: Anthony Evans  is a 67 y.o. male with a known history of seizure, stroke, hypertension, hypothyroidism and arthritis etc. The patient was sent to ED due to fall and neck pain last night.  He is being admitted for cervical fracture and rhabdomyolysis.  I discussed with the patient about his current condition, prognosis and CODE STATUS.  He said he want to be resuscitated and intubated to get him back but does not want live on vegetation status. Plan:  Code Status: Full code. Time spent discussing advance care planning: 17 minutes.

## 2018-09-14 NOTE — H&P (Addendum)
[facility] [place] NAME: Anthony Evans    MR#:  [id number]  DATE OF BIRTH:  03/10/1951  DATE OF ADMISSION:  09/14/2018  PRIMARY CARE PHYSICIAN: Mar Daring, PA-C   REQUESTING/REFERRING PHYSICIAN: Dr. Jimmye Norman.  CHIEF COMPLAINT:   Chief Complaint  Patient presents with  . Fall   Fall and neck pain last night. HISTORY OF PRESENT ILLNESS:  Anthony Evans  is a 67 y.o. male with a known history of seizure, stroke, hypertension, hypothyroidism and arthritis etc. The patient was sent to ED due to fall and neck pain last night.  The patient fell on the floor after coming back from bathroom.  He tried to get up but failed twice.  He was on the floor until 5 AM today.  He complains of neck pain and right eyebrow pain due to laceration.  He denies any other symptoms.  CT of the head is unremarkable.  CT of the cervical spine show C2 fracture.  Dr. Jimmye Norman discussed with on-call neurosurgeon, who did not feel like this was a significant injury and that he would remain in cervical collar.  PAST MEDICAL HISTORY:   Past Medical History:  Diagnosis Date  . Allergy   . Arthritis    psoriatic  . Back pain   . Cataract   . Degenerative disc disease   . GERD (gastroesophageal reflux disease)   . Hypertension   . Reflux   . Seizures ([place])    last seizure about 10 years ago  . 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 Thalassemia   . Thyroid disease     PAST SURGICAL HISTORY:   Past Surgical History:  Procedure Laterality Date  . BACK SURGERY     screws and plate in c4 and c5  . CATARACT EXTRACTION W/PHACO Left 05/15/2015   Procedure: CATARACT EXTRACTION PHACO AND INTRAOCULAR LENS PLACEMENT (IOC);  Surgeon: Lyla Glassing, MD;  Location: [facility] ORS;  Service: Ophthalmology;  Laterality: Left;  [place]: 2:40.9 AP: 17.3 CDE: 27.88  . CERVICAL FUSION    . COLONOSCOPY WITH PROPOFOL N/A 06/14/2016   Procedure: COLONOSCOPY WITH PROPOFOL;  Surgeon:  Manya Silvas, MD;  Location: [facility] ENDOSCOPY;  Service: Endoscopy;  Laterality: N/A;  . CORRECTION HAMMER TOE    . EYE SURGERY Right    cataract  . FRACTURE SURGERY    . HIP FRACTURE SURGERY Right    pins in hip  . JOINT REPLACEMENT    . KNEE ARTHROPLASTY Left    total knee replacement    SOCIAL HISTORY:   Social History   Tobacco Use  . Smoking status: Never Smoker  . Smokeless tobacco: Never Used  Substance Use Topics  . Alcohol use: No    FAMILY HISTORY:   Family History  Problem Relation Age of Onset  . Cancer Other   . Diabetes Other   . Arthritis Other     DRUG ALLERGIES:   Allergies  Allergen Reactions  . Etanercept Nausea And Vomiting  . Etodolac Nausea And Vomiting  . Latex Rash  . Rubbing Alcohol [Isopropyl Alcohol] Rash    REVIEW OF SYSTEMS:   Review of Systems  Constitutional: Negative for chills, fever and malaise/fatigue.  HENT: Negative for sore throat.   Eyes: Negative for blurred vision and double vision.  Respiratory: Negative for cough, hemoptysis, shortness of breath, wheezing and stridor.   Cardiovascular: Negative for chest pain, palpitations, orthopnea and leg swelling.  Gastrointestinal: Negative for abdominal pain, blood in stool, diarrhea, melena, nausea and vomiting.  Genitourinary: Negative for dysuria, flank pain and hematuria.  Musculoskeletal: Positive for neck pain. Negative for back pain and joint pain.  Skin: Negative for rash.  Neurological: Negative for dizziness, sensory change, focal weakness, seizures, loss of consciousness, weakness and headaches.  Endo/Heme/Allergies: Negative for polydipsia.  Psychiatric/Behavioral: Negative for depression. The patient is not nervous/anxious.     MEDICATIONS AT HOME:   Prior to Admission medications   Medication Sig Start Date End Date Taking? Authorizing Provider  carbamazepine (TEGRETOL) 200 MG tablet Take 400-600 mg by mouth See admin instructions. Take 2 tablets (400MG ) by  mouth 3 times daily with meals and 3 tablets (600MG ) by mouth at bedtime   Yes [provider]  cyclobenzaprine (FLEXERIL) 5 MG tablet Take 5 mg by mouth 3 (three) times daily as needed for muscle spasms.    Yes [provider]  folic acid (FOLVITE) 1 MG tablet Take 1 mg by mouth daily.   Yes [provider]  hydroxychloroquine (PLAQUENIL) 200 MG tablet Take 200 mg by mouth daily.  02/23/18  Yes [provider]  lansoprazole (PREVACID) 30 MG capsule Take 30 mg by mouth daily.   Yes [provider]  levothyroxine (SYNTHROID, LEVOTHROID) 175 MCG tablet Take 175 mcg by mouth daily.  04/24/14  Yes [provider]  meloxicam (MOBIC) 7.5 MG tablet Take 7.5 mg by mouth 2 (two) times daily.    Yes [provider]  methotrexate (RHEUMATREX) 2.5 MG tablet Take 10 mg by mouth every Wednesday.  12/26/17  Yes [provider]  primidone (MYSOLINE) 250 MG tablet Take 250 mg by mouth 3 (three) times daily.    Yes [provider]  timolol (TIMOPTIC) 0.5 % ophthalmic solution Place 1 drop into both eyes 2 (two) times daily.  12/29/17  Yes [provider]  triamterene-hydrochlorothiazide (MAXZIDE) 75-50 MG per tablet Take 1 tablet by mouth daily.    Yes [provider]      VITAL SIGNS:  Blood pressure 137/78, pulse (!) 103, temperature 98.2 F (36.8 C), temperature source Oral, resp. rate 18, SpO2 96 %.  PHYSICAL EXAMINATION:  Physical Exam  GENERAL:  67 y.o.-year-old patient lying in the bed with no acute distress.  Obesity. EYES: Pupils equal, round, reactive to light and accommodation. No scleral icterus. Extraocular muscles intact.  HEENT: Head atraumatic, normocephalic. Oropharynx and nasopharynx clear.  On cervical brace. NECK:  Supple, no jugular venous distention. No thyroid enlargement, no tenderness.  LUNGS: Normal breath sounds bilaterally, no wheezing, rales,rhonchi or crepitation. No use of accessory  muscles of respiration.  CARDIOVASCULAR: S1, S2 normal. No murmurs, rubs, or gallops.  ABDOMEN: Soft, nontender, nondistended. Bowel sounds present. No organomegaly or mass.  EXTREMITIES: No pedal edema, cyanosis, or clubbing.  NEUROLOGIC: Cranial nerves II through XII are intact. Muscle strength 5/5 in all extremities. Sensation intact. Gait not checked.  PSYCHIATRIC: The patient is alert and oriented x 3.  SKIN: No obvious rash, lesion, or ulcer.   LABORATORY PANEL:   CBC Recent Labs  Lab 09/14/18 1219  WBC 7.9  HGB 9.8*  HCT 31.6*  PLT 224   ------------------------------------------------------------------------------------------------------------------  Chemistries  Recent Labs  Lab 09/14/18 1219  NA 139  K 4.8  CL 99  CO2 26  GLUCOSE 120*  BUN 16  CREATININE 0.63  CALCIUM 8.9  AST 61*  ALT 18  ALKPHOS 64  BILITOT 1.5*   ------------------------------------------------------------------------------------------------------------------  Cardiac Enzymes Recent Labs  Lab 09/14/18 1219  TROPONINI <0.03   ------------------------------------------------------------------------------------------------------------------  RADIOLOGY:  Ct Head Wo Contrast  Result Date: 09/14/2018 CLINICAL DATA:  Status post fall and history of seizures. EXAM: CT HEAD WITHOUT CONTRAST CT CERVICAL SPINE WITHOUT CONTRAST TECHNIQUE: Multidetector CT imaging of the head and cervical spine was performed following the standard protocol without intravenous contrast. Multiplanar CT image reconstructions of the cervical spine were also generated. COMPARISON:  Brain MRI 05/12/2009 FINDINGS: CT HEAD FINDINGS Brain: No evidence of acute infarction, or hemorrhage. Stable enlargement of the left lateral ventricle with thinning of the left cerebral cortex. Vascular: Atherosclerotic calcifications of the intracranial carotid arteries. Skull: Normal. Negative for fracture or focal lesion. Sinuses/Orbits:  No acute finding. Other: None. CT CERVICAL SPINE FINDINGS Alignment: Reversal of cervical lordosis. Skull base and vertebrae: There is a minimally displaced impacted fracture of the left superior articular facet of C2. Cortical irregularity at the base of the dens may represent an oblique extension of the fracture, although it is difficult to characterize. The fracture line comes close but does not cross the left vertebral artery foramen. Prior C3-C6 anterior fusion with ankylosing changes. Soft tissues and spinal canal: No significant soft tissue swelling. Disc levels:  Multilevel osteoarthritic changes. Upper chest: Negative. Other: None. IMPRESSION: No acute intracranial abnormality. 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 Minimally displaced impacted fracture of C2. The fracture line extends through the left superior articular facet, and likely the base of the dens. No evidence of involvement of the left vertebral artery foramen. These results were called by telephone at the time of interpretation on 09/14/2018 at 1:51 pm to Dr. Lenise Arena , who verbally acknowledged these results. Electronically Signed   By: Fidela Salisbury M.D.   On: 09/14/2018 13:55   Ct Cervical Spine Wo Contrast  Result Date: 09/14/2018 CLINICAL DATA:  Status post fall and history of seizures. EXAM: CT HEAD WITHOUT CONTRAST CT CERVICAL SPINE WITHOUT CONTRAST TECHNIQUE: Multidetector CT imaging of the head and cervical spine was performed following the standard protocol without intravenous contrast. Multiplanar CT image reconstructions of the cervical spine were also generated. COMPARISON:  Brain MRI 05/12/2009 FINDINGS: CT HEAD FINDINGS Brain: No evidence of acute infarction, or hemorrhage. Stable enlargement of the left lateral ventricle with thinning of the left cerebral cortex. Vascular: Atherosclerotic calcifications of the  intracranial carotid arteries. Skull: Normal. Negative for fracture or focal lesion. Sinuses/Orbits: No acute finding. Other: None. CT CERVICAL SPINE FINDINGS Alignment: Reversal of cervical lordosis. Skull base and vertebrae: There is a minimally displaced impacted fracture of the left superior articular facet of C2. Cortical irregularity at the base of the dens may represent an oblique extension of the fracture, although it is difficult to characterize. The fracture line comes close but does not cross the left vertebral artery foramen. Prior C3-C6 anterior fusion with ankylosing changes. Soft tissues and spinal canal: No significant soft tissue swelling. Disc levels:  Multilevel osteoarthritic changes. Upper chest: Negative. Other: None. IMPRESSION: No acute intracranial abnormality. 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 Minimally displaced impacted fracture of C2. The fracture line extends through the left superior articular facet, and likely the base of the dens. No evidence of involvement of the left vertebral artery foramen. These results were called by telephone at the time of interpretation on 09/14/2018 at 1:51 pm  to Dr. Lenise Arena , who verbally acknowledged these results. Electronically Signed   By: Fidela Salisbury M.D.   On: 09/14/2018 13:55   Dg Hip Unilat W Or Wo Pelvis 2-3 Views Right  Result Date: 09/14/2018 CLINICAL DATA:  Recent fall with right hip pain, history of seizures EXAM: DG HIP (WITH OR WITHOUT PELVIS) 2-3V RIGHT COMPARISON:  Right hip films of 07/25/2014 FINDINGS: No definite acute fracture is seen. Hardware for pinning of prior right hip fracture is noted and is unchanged in position. There is a vague lucency on only one view of the right hip within the inferior aspect of the greater trochanter of questionable significance. If pain persists follow-up films may be warranted. The  pelvic rami are intact. The SI joints are corticated IMPRESSION: 1. No definite fracture. Prior right hip pinning hardware unchanged in position. 2. Lucency on only 1 film in the region of the inferior aspect of the right femoral greater trochanter of uncertain significance. Consider follow-up films if warranted clinically. Electronically Signed   By: Ivar Drape M.D.   On: 09/14/2018 13:37      IMPRESSION AND PLAN:   Fall and the C2 fracture. The patient will be placed for observation. Pain control and continue cervical brace.  Mild rhabdomyolysis.  IV fluid support and follow-up CK.  Seizure disorder.  Continue home medication.  Hypertension.  Continue home hypertension medication.  Hypothyroidism.  Continue Synthroid.  All the records are reviewed and case discussed with ED provider. Management plans discussed with the patient, sister and they are in agreement.  CODE STATUS: Full code.  TOTAL TIME TAKING CARE OF THIS PATIENT: 37 minutes.    Demetrios Loll M.D on 09/14/2018 at 4:07 PM  Between 7am to 6pm - Pager - [phone]  After 6pm go to [email or web address] - password [email or web address] [facility]  [employer] [facility] [facility]  Office  [phone]  CC: Primary care physician; Mar Daring, PA-C   Note: This dictation was prepared with Dragon dictation along with smaller phrase technology. Any transcriptional errors that result from this process are unin

## 2018-09-14 NOTE — ED Notes (Signed)
Patient transported to CT 

## 2018-09-14 NOTE — Consult Note (Signed)
Referring Physician:  No referring provider defined for this encounter.  Primary Physician:  Mar Daring, PA-C  Chief Complaint:  C2 fracture  History of Present Illness: 09/14/2018 Anthony Evans is a 67 y.o. male who presents with the chief complaint of C2 fracture after a fall.  He was up at midnight last night when he felt an aura for his seizure disorder.  He sometimes passes out with a seizure, so attempted to get back to bed.  During this time, he fell to the floor.  He began having neck pain and was unable to get up.  He remained on the floor all night.  He was brought to [facility] for evaluation, where he was found to have neck pain and an eye laceration.  He also has some problem with mobility and was unable to get up from the ground last night.  At baseline, he lives alone and is able to take care of himself.  He previously had C3-6 ACDF by Dr. Joya Salm.  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    Review of Systems:  A 10 point review of systems is negative, except for the pertinent positives and negatives detailed in the HPI.  Past Medical History: Past Medical History:  Diagnosis Date  . Allergy   . Arthritis    psoriatic  . Back pain   . Cataract   . Degenerative disc disease   . GERD (gastroesophageal reflux disease)   . Hypertension   . Reflux   . Seizures ([place])    last seizure about 10 years ago  . 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 Thalassemia   . Thyroid disease     Past Surgical History: Past Surgical History:  Procedure Laterality Date  . BACK SURGERY     screws and plate in c4 and c5  . CATARACT EXTRACTION W/PHACO Left 05/15/2015   Procedure: CATARACT EXTRACTION PHACO AND INTRAOCULAR LENS PLACEMENT (IOC);  Surgeon: Lyla Glassing, MD;  Location: [facility] ORS;  Service: Ophthalmology;  Laterality: Left;  [place]: 2:40.9 AP: 17.3 CDE: 27.88  . CERVICAL FUSION    . COLONOSCOPY WITH PROPOFOL N/A 06/14/2016   Procedure: COLONOSCOPY WITH  PROPOFOL;  Surgeon: Manya Silvas, MD;  Location: [facility] ENDOSCOPY;  Service: Endoscopy;  Laterality: N/A;  . CORRECTION HAMMER TOE    . EYE SURGERY Right    cataract  . FRACTURE SURGERY    . HIP FRACTURE SURGERY Right    pins in hip  . JOINT REPLACEMENT    . KNEE ARTHROPLASTY Left    total knee replacement    Allergies: Allergies as of 09/14/2018 - Review Complete 09/14/2018  Allergen Reaction Noted  . Etanercept Nausea And Vomiting 04/09/2015  . Etodolac Nausea And Vomiting 04/09/2015  . Latex Rash 05/17/2014  . Rubbing alcohol [isopropyl alcohol] Rash 05/12/2015    Medications: No current facility-administered medications for this encounter.   Current Outpatient Medications:  .  carbamazepine (TEGRETOL) 200 MG tablet, Take 400-600 mg by mouth See admin instructions. Take 2 tablets (400MG ) by mouth 3 times daily with meals and 3 tablets (600MG ) by mouth at bedtime, Disp: , Rfl:  .  cyclobenzaprine (FLEXERIL) 5 MG tablet, Take 5 mg by mouth 3 (three) times daily as needed for muscle spasms. , Disp: , Rfl:  .  folic acid (FOLVITE) 1 MG tablet, Take 1 mg by mouth daily., Disp: , Rfl:  .  hydroxychloroquine (PLAQUENIL) 200 MG tablet, Take 200  mg by mouth daily. , Disp: , Rfl:  .  lansoprazole (PREVACID) 30 MG capsule, Take 30 mg by mouth daily., Disp: , Rfl:  .  levothyroxine (SYNTHROID, LEVOTHROID) 175 MCG tablet, Take 175 mcg by mouth daily. , Disp: , Rfl:  .  meloxicam (MOBIC) 7.5 MG tablet, Take 7.5 mg by mouth 2 (two) times daily. , Disp: , Rfl:  .  methotrexate (RHEUMATREX) 2.5 MG tablet, Take 10 mg by mouth every Wednesday. , Disp: , Rfl:  .  primidone (MYSOLINE) 250 MG tablet, Take 250 mg by mouth 3 (three) times daily. , Disp: , Rfl:  .  timolol (TIMOPTIC) 0.5 % ophthalmic solution, Place 1 drop into both eyes 2 (two) times daily. , Disp: , Rfl: 2 .  triamterene-hydrochlorothiazide (MAXZIDE) 75-50 MG per tablet, Take 1 tablet by mouth daily. , Disp: , Rfl:    Social  History: Social History   Tobacco Use  . Smoking status: Never Smoker  . Smokeless tobacco: Never Used  Substance Use Topics  . Alcohol use: No  . Drug use: Not on file    Family Medical History: Family History  Problem Relation Age of Onset  . Cancer Other   . Diabetes Other   . Arthritis Other     Physical Examination: Vitals:   09/14/18 1158  BP: 137/78  Pulse: (!) 103  Resp: 18  Temp: 98.2 F (36.8 C)  SpO2: 96%     General: Patient is well developed, well nourished, calm, collected, and in no apparent distress.  Psychiatric: Patient is non-anxious.  Head:  Pupils equal, round, and reactive to light.  ENT:  Oral mucosa appears well hydrated.  Neck:   Supple.  ROM untested due to fracture  Respiratory: Patient is breathing without any difficulty.  Extremities: No edema.  Vascular: Palpable pulses in dorsal pedal vessels.  Skin:   On exposed skin, there are no abnormal skin lesions.  NEUROLOGICAL:  General: In no acute distress.   Awake, alert, oriented to person, place, and time.  Pupils equal round and reactive to light.  Facial tone is symmetric.  Tongue protrusion is midline.    ROM of spine: untested due to fracture  Strength: Side Biceps Triceps Deltoid Interossei Grip Wrist Ext. Wrist Flex.  R 4 4 4  4- 4- 4- 4-  L 5 5 5 5 5 5 5    Side Iliopsoas Quads Hamstring PF DF EHL  R 4 4 4  4+ 4+ 4+  L 5 5 5 5 5 5    Reflexes are 1+ and symmetric at the biceps, triceps, brachioradialis, patella and achilles.   Bilateral upper and lower extremity sensation is intact to light touch.   Gait is untested due to fall  Hoffman's is absent.  Imaging: CT C spine 09/14/2018 IMPRESSION: No acute intracranial abnormality.  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  Minimally displaced impacted fracture of C2. The fracture line extends through the left superior  articular facet, and likely the base of the dens. No evidence of involvement of the left vertebral artery foramen.  These results were called by telephone at the time of interpretation on 09/14/2018 at 1:51 pm to Dr. Lenise Arena , who verbally acknowledged these results.   Electronically Signed   By: Fidela Salisbury M.D.   On: 09/14/2018 13:55   I have personally reviewed the images and agree with the above interpretation.  Assessment and Plan: Anthony Evans is a pleasant 67 y.o. male with minimally displaced, stable C2 fracture.  I would recommend he be fitted for a cervical collar. I have called [employer], who will fit him. He can follow up in clinic in 3-4 weeks.  I will arrange follow up.    At discharge, please consider giving him some muscle relaxants to help with the pain from this fracture.  This fracture does not necessitate admission by itself.     Serenidy Waltz K. Izora Ribas MD, [facility]

## 2018-09-14 NOTE — Progress Notes (Signed)
Pt arrived to room 136 from ED. Pt alert and oriented. Pt transferred to low bed. Family at bedside. No complaints of pain. VSS. Sacral dressing applied. Cervical collar in place. Bed in lowest position call bell in reach bed alarm on.

## 2018-09-14 NOTE — ED Provider Notes (Signed)
[facility] Emergency Department Provider Note       Time seen: ----------------------------------------- 12:32 PM on 09/14/2018 -----------------------------------------   I have reviewed the triage vital signs and the nursing notes.  HISTORY   Chief Complaint Fall    HPI Anthony Evans is a 67 y.o. male with a history of arthritis, degenerative disc disease, GERD, hypertension, seizures, CVA, thyroid disease who presents to the ED for a fall that occurred at midnight.  Patient states he has a history of seizures and had an aura last night while walk to his kitchen to his bed and he fell on the floor.  Patient states he laid on the floor for approximately midnight to 5 AM and tried to ambulate 2 times later but failed.  He was found on the floor by his sister.  He sustained a laceration near his right eyebrow is complaints of neck pain and right hip pain.  Past Medical History:  Diagnosis Date  . Allergy   . Arthritis    psoriatic  . Back pain   . Cataract   . Degenerative disc disease   . GERD (gastroesophageal reflux disease)   . Hypertension   . Reflux   . Seizures ([place])    last seizure about 10 years ago  . 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 Thalassemia   . Thyroid disease     Patient Active Problem List   Diagnosis Date Noted  . Hypertension 03/08/2018  . GERD (gastroesophageal reflux disease) 03/08/2018  . Thyroid disease 03/08/2018  . Degenerative disc disease 03/08/2018  . Psoriatic arthritis ([place]) 03/08/2018  . Back pain 03/08/2018  . Thalassemia 03/08/2018  . Seizures ([place]) 03/08/2018  . H/O cataract 03/08/2018  . Seasonal allergies 03/08/2018    Past Surgical History:  Procedure Laterality Date  . BACK SURGERY     screws and plate in c4 and c5  . CATARACT EXTRACTION W/PHACO Left 05/15/2015   Procedure: CATARACT EXTRACTION PHACO AND INTRAOCULAR LENS PLACEMENT (IOC);  Surgeon: Lyla Glassing, MD;  Location: [facility] ORS;   Service: Ophthalmology;  Laterality: Left;  [place]: 2:40.9 AP: 17.3 CDE: 27.88  . CERVICAL FUSION    . COLONOSCOPY WITH PROPOFOL N/A 06/14/2016   Procedure: COLONOSCOPY WITH PROPOFOL;  Surgeon: Manya Silvas, MD;  Location: [facility] ENDOSCOPY;  Service: Endoscopy;  Laterality: N/A;  . CORRECTION HAMMER TOE    . EYE SURGERY Right    cataract  . FRACTURE SURGERY    . HIP FRACTURE SURGERY Right    pins in hip  . JOINT REPLACEMENT    . KNEE ARTHROPLASTY Left    total knee replacement    Allergies Etanercept; Etodolac; Latex; and Rubbing alcohol [isopropyl alcohol]  Social History Social History   Tobacco Use  . Smoking status: Never Smoker  . Smokeless tobacco: Never Used  Substance Use Topics  . Alcohol use: No  . Drug use: Not on file   Review of Systems Constitutional: Negative for fever. Cardiovascular: Negative for chest pain. Respiratory: Negative for shortness of breath. Gastrointestinal: Negative for abdominal pain, vomiting and diarrhea. Musculoskeletal: Positive for right hip pain, neck pain Skin: Positive for frontal scalp laceration Neurological: Positive for head injury  All systems negative/normal/unremarkable except as stated in the HPI  ____________________________________________   PHYSICAL EXAM:  VITAL SIGNS: ED Triage Vitals  Enc Vitals Group     BP 09/14/18 1158 137/78     Pulse Rate 09/14/18 1158 (!) 103  Resp 09/14/18 1158 18     Temp 09/14/18 1158 98.2 F (36.8 C)     Temp Source 09/14/18 1158 Oral     SpO2 09/14/18 1158 96 %     Weight --      Height --      Head Circumference --      Peak Flow --      Pain Score 09/14/18 1159 0     Pain Loc --      Pain Edu? --      Excl. in [place]? --    Constitutional: Alert and oriented. Well appearing and in no distress. Eyes: Conjunctivae are normal. Normal extraocular movements. ENT   Head: Normocephalic, vertically oriented laceration that extends to the right eyebrow.  This appears to be  superficial and 3 cm   Nose: No congestion/rhinnorhea.   Mouth/Throat: Mucous membranes are moist.   Neck: No stridor. Cardiovascular: Normal rate, regular rhythm. No murmurs, rubs, or gallops. Respiratory: Normal respiratory effort without tachypnea nor retractions. Breath sounds are clear and equal bilaterally. No wheezes/rales/rhonchi. Gastrointestinal: Soft and nontender. Normal bowel sounds Musculoskeletal: Mild pain with range of motion of the right hip, arthritic changes are noted in the joints Neurologic:  Normal speech and language. No gross focal neurologic deficits are appreciated.  Skin:  Skin is warm, dry and intact. No rash noted. Psychiatric: Mood and affect are normal. Speech and behavior are normal.  ____________________________________________  ED COURSE:  As part of my medical decision making, I reviewed the following data within the [place] History obtained from family if available, nursing notes, old chart and ekg, as well as notes from prior ED visits. Patient presented for a fall, we will assess with labs and imaging as indicated at this time.   Procedures ____________________________________________   LABS (pertinent positives/negatives)  Labs Reviewed  CBC WITH DIFFERENTIAL/PLATELET - Abnormal; Notable for the following components:      Result Value   Hemoglobin 9.8 (*)    HCT 31.6 (*)    MCV 73.0 (*)    MCH 22.6 (*)    RDW 18.6 (*)    nRBC 0.5 (*)    Lymphs Abs 0.5 (*)    All other components within normal limits  COMPREHENSIVE METABOLIC PANEL - Abnormal; Notable for the following components:   Glucose, Bld 120 (*)    AST 61 (*)    Total Bilirubin 1.5 (*)    All other components within normal limits  TROPONIN I  CARBAMAZEPINE LEVEL, TOTAL  CK  URINALYSIS, COMPLETE (UACMP) WITH MICROSCOPIC  CBG MONITORING, ED    RADIOLOGY Images were viewed by me  CT head, C-spine, right hip x-rays IMPRESSION: 1. No definite  fracture. Prior right hip pinning hardware unchanged in position. 2. Lucency on only 1 film in the region of the inferior aspect of the right femoral greater trochanter of uncertain significance. Consider follow-up films if warranted clinically. IMPRESSION: No acute intracranial abnormality.  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  Minimally displaced impacted fracture of C2. The fracture line extends through the left superior articular facet, and likely the base of the dens. No evidence of involvement of the left vertebral artery foramen.  These results were called by telephone at the time of interpretation on 09/14/2018 at 1:51 pm to Dr. Lenise Arena , who verbally acknowledged these results. ____________________________________________  DIFFERENTIAL DIAGNOSIS   Subdural, fracture, seizure, dehydration,  electrolyte abnormality  FINAL ASSESSMENT AND PLAN  Fall, minor head injury, C2 fracture, weakness   Plan: The patient had presented for a fall that occurred at home. Patient's labs did not reveal any acute process, he has known anemia. Patient's imaging were negative with the exception of a C2 fracture.  Case was reviewed by neurosurgery who did not feel like this was a significant injury and that he would remain in a cervical collar.  I do not have an expiration for the fall.  Patient states now that he does not think he actually had a seizure that he may have passed out.  He would benefit from hospital observation, neurosurgery consultation and evaluation by physical therapy prior to discharge.   Laurence Aly, MD   Note: This note was generated in part or whole with voice recognition software. Voice recognition is usually quite accurate but there are transcription errors that can and very often do occur. I apologize for any typographical errors that were not  detected and corrected.     Earleen Newport, MD 09/14/18 [id number]

## 2018-09-14 NOTE — ED Notes (Signed)
Patient transported to X-ray 

## 2018-09-14 NOTE — ED Triage Notes (Signed)
Pt arrived via EMS c/o fall x3 that began around 0000 today. States that he has a hx of seizures and had a aura last night while walking from his kitchen to his bed and "fell" on the floor. He states that he laid on the floor from 0000-0500 and tried to ambulate 2 times after that but fell. He was found on the floor by his sister and someone that was working on the house. Laceration to rt brow and midline neck pain.

## 2018-09-14 NOTE — ED Notes (Signed)
Patient denies pain and is resting comfortably.  

## 2018-09-15 ENCOUNTER — Observation Stay: Payer: Medicare Other

## 2018-09-15 LAB — BASIC METABOLIC PANEL
ANION GAP: 9 (ref 5–15)
BUN: 13 mg/dL (ref 8–23)
CHLORIDE: 102 mmol/L (ref 98–111)
CO2: 28 mmol/L (ref 22–32)
Calcium: 8.1 mg/dL — ABNORMAL LOW (ref 8.9–10.3)
Creatinine, Ser: 0.49 mg/dL — ABNORMAL LOW (ref 0.61–1.24)
GFR calc non Af Amer: >60 mL/min (ref >60)
Glucose, Bld: 103 mg/dL — ABNORMAL HIGH (ref 70–99)
POTASSIUM: 3.2 mmol/L — AB (ref 3.5–5.1)
Sodium: 139 mmol/L (ref 135–145)

## 2018-09-15 LAB — INFLUENZA PANEL BY PCR (TYPE A & B)
INFLAPCR: NEGATIVE
INFLBPCR: NEGATIVE

## 2018-09-15 LAB — CK: Total CK: 241 U/L (ref 49–397)

## 2018-09-15 MED ORDER — GUAIFENESIN-DM 100-10 MG/5ML PO SYRP: 5.0000 mL | ORAL_SOLUTION | ORAL | Status: DC | PRN

## 2018-09-15 MED ORDER — DOXYCYCLINE HYCLATE 100 MG PO TABS: 100.0000 mg | ORAL_TABLET | Freq: Two times a day (BID) | ORAL | Status: DC

## 2018-09-15 MED ORDER — DOXYCYCLINE HYCLATE 100 MG PO TABS: 100.0000 mg | ORAL_TABLET | Freq: Two times a day (BID) | ORAL | 0 refills | Status: AC

## 2018-09-15 MED ORDER — LEVOTHYROXINE SODIUM 175 MCG PO TABS: 175.0000 ug | ORAL_TABLET | Freq: Every day | ORAL | Status: DC

## 2018-09-15 MED ORDER — TAMSULOSIN HCL 0.4 MG PO CAPS: 0.4000 mg | ORAL_CAPSULE | Freq: Every day | ORAL | 0 refills | Status: AC

## 2018-09-15 MED ORDER — GUAIFENESIN-DM 100-10 MG/5ML PO SYRP: 5.0000 mL | ORAL_SOLUTION | ORAL | 0 refills | Status: DC | PRN

## 2018-09-15 MED ORDER — HYDROCODONE-ACETAMINOPHEN 5-325 MG PO TABS: 1.0000 | ORAL_TABLET | ORAL | 0 refills | Status: DC | PRN

## 2018-09-15 MED ORDER — POTASSIUM CHLORIDE CRYS ER 20 MEQ PO TBCR: 40.0000 meq | EXTENDED_RELEASE_TABLET | Freq: Once | ORAL | Status: DC

## 2018-09-15 MED ORDER — TAMSULOSIN HCL 0.4 MG PO CAPS: 0.4000 mg | ORAL_CAPSULE | Freq: Every day | ORAL | Status: DC

## 2018-09-15 NOTE — Progress Notes (Signed)
Discharge summary reviewed with verbal understanding. Answered all questions. Escorted to personal vehicle via wc.

## 2018-09-15 NOTE — NC FL2 (Addendum)
[place] LEVEL OF CARE SCREENING TOOL     IDENTIFICATION  Patient Name: Anthony Evans Birthdate: 1951-03-19 Sex: male Admission Date (Current Location): 09/14/2018  [place] and [place] Number:  Engineering geologist and Address:  [facility], [address], [place], [place] [address]      Provider Number: [id number]  Attending Physician Name and Address:  Dustin Flock, MD  Relative Name and Phone Number:       Current Level of Care: Hospital Recommended Level of Care: [place] Prior Approval Number:    Date Approved/Denied:   PASRR Number: [id number])  Discharge Plan: SNF    Current Diagnoses: Patient Active Problem List   Diagnosis Date Noted  . C2 cervical fracture ([place]) 09/14/2018  . Hypertension 03/08/2018  . GERD (gastroesophageal reflux disease) 03/08/2018  . Thyroid disease 03/08/2018  . Degenerative disc disease 03/08/2018  . Psoriatic arthritis ([place]) 03/08/2018  . Back pain 03/08/2018  . Thalassemia 03/08/2018  . Seizures ([place]) 03/08/2018  . H/O cataract 03/08/2018  . Seasonal allergies 03/08/2018    Orientation RESPIRATION BLADDER Height & Weight     Self, Time, Situation, Place  Normal Continent Weight: 195 lb (88.5 kg) Height:  5\' 10"  (177.8 cm)  BEHAVIORAL SYMPTOMS/MOOD NEUROLOGICAL BOWEL NUTRITION STATUS      Continent Diet(Diet: Heart Healthy )  AMBULATORY STATUS COMMUNICATION OF NEEDS Skin   Extensive Assist Verbally Normal                       Personal Care Assistance Level of Assistance  Bathing, Feeding, Dressing Bathing Assistance: Limited assistance Feeding assistance: Independent Dressing Assistance: Limited assistance     Functional Limitations Info  Sight, Hearing, Speech Sight Info: Adequate Hearing Info: Adequate Speech Info: Adequate    SPECIAL CARE FACTORS FREQUENCY  PT (By licensed PT), OT (By licensed OT)     PT Frequency: (5) OT Frequency: (5)             Contractures      Additional Factors Info  Code Status, Allergies Code Status Info: (Full Code. ) Allergies Info: (Etanercept, Etodolac, Latex, Rubbing Alcohol Isopropyl Alcohol)           Current Medications (09/15/2018):  This is the current hospital active medication list Current Facility-Administered Medications  Medication Dose Route Frequency Provider Last Rate Last Dose  . 0.9 %  sodium chloride infusion   Intravenous Continuous Demetrios Loll, MD 75 mL/hr at 09/15/18 7035    . acetaminophen (TYLENOL) tablet 650 mg  650 mg Oral Q6H PRN Demetrios Loll, MD   650 mg at 09/14/18 2329   Or  . acetaminophen (TYLENOL) suppository 650 mg  650 mg Rectal Q6H PRN Demetrios Loll, MD      . albuterol (PROVENTIL) (2.5 MG/3ML) 0.083% nebulizer solution 2.5 mg  2.5 mg Nebulization Q2H PRN Demetrios Loll, MD      . bisacodyl (DULCOLAX) EC tablet 5 mg  5 mg Oral Daily PRN Demetrios Loll, MD      . carbamazepine (TEGRETOL) tablet 400 mg  400 mg Oral TID WC Demetrios Loll, MD   400 mg at 09/15/18 1158  . carbamazepine (TEGRETOL) tablet 600 mg  600 mg Oral QHS Demetrios Loll, MD   600 mg at 09/14/18 2202  . cyclobenzaprine (FLEXERIL) tablet 5 mg  5 mg Oral TID PRN Demetrios Loll, MD      . enoxaparin (LOVENOX) injection 40 mg  40 mg Subcutaneous Q24H  Demetrios Loll, MD   40 mg at 09/14/18 2204  . folic acid (FOLVITE) tablet 1 mg  1 mg Oral Daily Demetrios Loll, MD   1 mg at 09/15/18 1008  . HYDROcodone-acetaminophen (NORCO/VICODIN) 5-325 MG per tablet 1-2 tablet  1-2 tablet Oral Q4H PRN Demetrios Loll, MD      . hydroxychloroquine (PLAQUENIL) tablet 200 mg  200 mg Oral Daily Demetrios Loll, MD   200 mg at 09/15/18 1008  . [START ON 09/16/2018] levothyroxine (SYNTHROID, LEVOTHROID) tablet 175 mcg  175 mcg Oral QAC breakfast Demetrios Loll, MD      . ondansetron [facility]) tablet 4 mg  4 mg Oral Q6H PRN Demetrios Loll, MD       Or  . ondansetron [facility]) injection 4 mg  4 mg Intravenous Q6H PRN Demetrios Loll, MD      . pantoprazole (PROTONIX) EC  tablet 40 mg  40 mg Oral Daily Demetrios Loll, MD   40 mg at 09/15/18 1008  . primidone (MYSOLINE) tablet 250 mg  250 mg Oral TID Demetrios Loll, MD   250 mg at 09/15/18 1008  . senna-docusate (Senokot-S) tablet 1 tablet  1 tablet Oral QHS PRN Demetrios Loll, MD      . timolol (TIMOPTIC) 0.5 % ophthalmic solution 1 drop  1 drop Both Eyes BID Demetrios Loll, MD   1 drop at 09/15/18 1008  . triamterene-hydrochlorothiazide (MAXZIDE-25) 37.5-25 MG per tablet 2 tablet  2 tablet Oral Daily Demetrios Loll, MD   2 tablet at 09/15/18 1008     Discharge Medications: Please see discharge summary for a list of discharge medications.  Relevant Imaging Results:  Relevant Lab Results: Droplet precautions: Rule out flu    Additional Information (SSN: [id number])  Annalea Alguire, Veronia Beets, LCSW

## 2018-09-15 NOTE — Care Management (Signed)
RNCM spoke with patient again. He does not have preference of home health agency.  Referral to [facility] and that patient would discharge to home today.

## 2018-09-15 NOTE — Discharge Summary (Signed)
[place] at [facility], 67 y.o., DOB Oct 28, 1951, MRN [id number]. Admission date: 09/14/2018 Discharge Date 09/15/2018 Primary MD Mar Daring, PA-C Admitting Physician Demetrios Loll, MD  Admission Diagnosis  Injury of head, initial encounter [S09.90XA] Fall, initial encounter [W19.XXXA] Closed nondisplaced fracture of second cervical vertebra, unspecified fracture morphology, initial encounter [facility]) [S12.101A]  Discharge Diagnosis   Active Problems: Post nondisplaced fracture of the second cervical vertebra Acute bronchitis Urinary retention Mild rhabdomyolysis Seizure disorder Hypertension Hypothyroidism    Hospital Course  Patient 67 year old with history of seizure, stroke, hypertension,, hypothyroidism and arthritis who presented to the ED after a fall.  Patient was evaluated in the ED CT scan of the cervical spine showed C2 fracture.  Neurosurgery saw the patient in the ED and recommended cervical collar.  No need for surgery.  With outpatient follow-up.  Also started having some cough since last night.  Also had some urinary retention.  He will be started on treatment for acute bronchitis.  Patient also was be started on Flomax.           Consults  NEURO SURGERY  Significant Tests:  See full reports for all details    Ct Head Wo Contrast  Result Date: 09/14/2018 CLINICAL DATA:  Status post fall and history of seizures. EXAM: CT HEAD WITHOUT CONTRAST CT CERVICAL SPINE WITHOUT CONTRAST TECHNIQUE: Multidetector CT imaging of the head and cervical spine was performed following the standard protocol without intravenous contrast. Multiplanar CT image reconstructions of the cervical spine were also generated. COMPARISON:  Brain MRI 05/12/2009 FINDINGS: CT HEAD FINDINGS Brain: No evidence of acute infarction, or hemorrhage. Stable enlargement of the left lateral ventricle with thinning of the left cerebral cortex. Vascular:  Atherosclerotic calcifications of the intracranial carotid arteries. Skull: Normal. Negative for fracture or focal lesion. Sinuses/Orbits: No acute finding. Other: None. CT CERVICAL SPINE FINDINGS Alignment: Reversal of cervical lordosis. Skull base and vertebrae: There is a minimally displaced impacted fracture of the left superior articular facet of C2. Cortical irregularity at the base of the dens may represent an oblique extension of the fracture, although it is difficult to characterize. The fracture line comes close but does not cross the left vertebral artery foramen. Prior C3-C6 anterior fusion with ankylosing changes. Soft tissues and spinal canal: No significant soft tissue swelling. Disc levels:  Multilevel osteoarthritic changes. Upper chest: Negative. Other: None. IMPRESSION: No acute intracranial abnormality. 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 Minimally displaced impacted fracture of C2. The fracture line extends through the left superior articular facet, and likely the base of the dens. No evidence of involvement of the left vertebral artery foramen. These results were called by telephone at the time of interpretation on 09/14/2018 at 1:51 pm to Dr. Lenise Arena , who verbally acknowledged these results. Electronically Signed   By: Fidela Salisbury M.D.   On: 09/14/2018 13:55   Ct Cervical Spine Wo Contrast  Result Date: 09/14/2018 CLINICAL DATA:  Status post fall and history of seizures. EXAM: CT HEAD WITHOUT CONTRAST CT CERVICAL SPINE WITHOUT CONTRAST TECHNIQUE: Multidetector CT imaging of the head and cervical spine was performed following the standard protocol without intravenous contrast. Multiplanar CT image reconstructions of the cervical spine were also generated. COMPARISON:  Brain MRI 05/12/2009 FINDINGS: CT HEAD FINDINGS Brain: No evidence of acute infarction, or hemorrhage.  Stable enlargement of the left lateral ventricle with thinning  of the left cerebral cortex. Vascular: Atherosclerotic calcifications of the intracranial carotid arteries. Skull: Normal. Negative for fracture or focal lesion. Sinuses/Orbits: No acute finding. Other: None. CT CERVICAL SPINE FINDINGS Alignment: Reversal of cervical lordosis. Skull base and vertebrae: There is a minimally displaced impacted fracture of the left superior articular facet of C2. Cortical irregularity at the base of the dens may represent an oblique extension of the fracture, although it is difficult to characterize. The fracture line comes close but does not cross the left vertebral artery foramen. Prior C3-C6 anterior fusion with ankylosing changes. Soft tissues and spinal canal: No significant soft tissue swelling. Disc levels:  Multilevel osteoarthritic changes. Upper chest: Negative. Other: None. IMPRESSION: No acute intracranial abnormality. 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 Minimally displaced impacted fracture of C2. The fracture line extends through the left superior articular facet, and likely the base of the dens. No evidence of involvement of the left vertebral artery foramen. These results were called by telephone at the time of interpretation on 09/14/2018 at 1:51 pm to Dr. Lenise Arena , who verbally acknowledged these results. Electronically Signed   By: Fidela Salisbury M.D.   On: 09/14/2018 13:55   Dg Chest Port 1 View  Result Date: 09/15/2018 CLINICAL DATA:  Short of breath cough EXAM: PORTABLE CHEST 1 VIEW COMPARISON:  06/02/2009 FINDINGS: Lungs remain clear without infiltrate effusion or edema. Heart size upper normal. Moderate scoliosis. IMPRESSION: No active disease. Electronically Signed   By: Franchot Gallo M.D.   On: 09/15/2018 12:25   Dg Hip Unilat W Or Wo Pelvis 2-3 Views Right  Result Date:  09/14/2018 CLINICAL DATA:  Recent fall with right hip pain, history of seizures EXAM: DG HIP (WITH OR WITHOUT PELVIS) 2-3V RIGHT COMPARISON:  Right hip films of 07/25/2014 FINDINGS: No definite acute fracture is seen. Hardware for pinning of prior right hip fracture is noted and is unchanged in position. There is a vague lucency on only one view of the right hip within the inferior aspect of the greater trochanter of questionable significance. If pain persists follow-up films may be warranted. The pelvic rami are intact. The SI joints are corticated IMPRESSION: 1. No definite fracture. Prior right hip pinning hardware unchanged in position. 2. Lucency on only 1 film in the region of the inferior aspect of the right femoral greater trochanter of uncertain significance. Consider follow-up films if warranted clinically. Electronically Signed   By: Ivar Drape M.D.   On: 09/14/2018 13:37       Today   Subjective:   Anthony Evans complains of some cough Objective:   Blood pressure 139/74, pulse 90, temperature 99.2 F (37.3 C), temperature source Oral, resp. rate 18, height 5\' 10"  (1.778 m), weight 88.5 kg, SpO2 97 %.  .  Intake/Output Summary (Last 24 hours) at 09/15/2018 1521 Last data filed at 09/15/2018 1518 Gross per 24 hour  Intake 1330.74 ml  Output 1720 ml  Net -389.26 ml    Exam VITAL SIGNS: Blood pressure 139/74, pulse 90, temperature 99.2 F (37.3 C), temperature source Oral, resp. rate 18, height 5\' 10"  (1.778 m), weight 88.5 kg, SpO2 97 %.  GENERAL:  67 y.o.-year-old patient lying in the bed with no acute distress.  EYES: Pupils equal, round, reactive to light and accommodation. No scleral icterus. Extraocular muscles intact.  HEENT: Head atraumatic, normocephalic. Oropharynx and nasopharynx clear.  NECK:  Supple, no jugular venous  distention. No thyroid enlargement, no tenderness.  LUNGS: Normal breath sounds bilaterally, no wheezing, rales,rhonchi or crepitation. No use of  accessory muscles of respiration.  CARDIOVASCULAR: S1, S2 normal. No murmurs, rubs, or gallops.  ABDOMEN: Soft, nontender, nondistended. Bowel sounds present. No organomegaly or mass.  EXTREMITIES: No pedal edema, cyanosis, or clubbing.  NEUROLOGIC: Cranial nerves II through XII are intact. Muscle strength 5/5 in all extremities. Sensation intact. Gait not checked.  PSYCHIATRIC: The patient is alert and oriented x 3.  SKIN: No obvious rash, lesion, or ulcer.   Data Review     CBC w Diff:  Lab Results  Component Value Date   WBC 7.9 09/14/2018   HGB 9.8 (L) 09/14/2018   HCT 31.6 (L) 09/14/2018   PLT 224 09/14/2018   LYMPHOPCT 6 09/14/2018   MONOPCT 9 09/14/2018   EOSPCT 2 09/14/2018   BASOPCT 0 09/14/2018   CMP:  Lab Results  Component Value Date   NA 139 09/15/2018   K 3.2 (L) 09/15/2018   CL 102 09/15/2018   CO2 28 09/15/2018   BUN 13 09/15/2018   CREATININE 0.49 (L) 09/15/2018   PROT 7.7 09/14/2018   ALBUMIN 4.6 09/14/2018   BILITOT 1.5 (H) 09/14/2018   ALKPHOS 64 09/14/2018   AST 61 (H) 09/14/2018   ALT 18 09/14/2018  .  Micro Results No results found for this or any previous visit (from the past 240 hour(s)).      Code Status Orders  (From admission, onward)         Start     Ordered   09/14/18 1719  Full code  Continuous     09/14/18 1718        Code Status History    This patient has a current code status but no historical code status.    Advance Directive Documentation     Most Recent Value  Type of Advance Directive  Living will  Pre-existing out of facility DNR order (yellow form or pink MOST form)  -  "MOST" Form in Place?  -          Follow-up Information    Meade Maw, MD Follow up in 3 week(s).   Specialty:  Neurosurgery Why:  c spine fracture hosp fu Contact information: [place] [address] [place], [place], PA-C Follow up in 6 day(s).   Specialty:  Family  Medicine Why:  hosp f/u Contact information: [place] [address] [phone]           Discharge Medications   Allergies as of 09/15/2018      Reactions   Etanercept Nausea And Vomiting   Etodolac Nausea And Vomiting   Latex Rash   Rubbing Alcohol [isopropyl Alcohol] Rash      Medication List    TAKE these medications   carbamazepine 200 MG tablet Commonly known as:  TEGRETOL Take 400-600 mg by mouth See admin instructions. Take 2 tablets (400MG ) by mouth 3 times daily with meals and 3 tablets (600MG ) by mouth at bedtime   cyclobenzaprine 5 MG tablet Commonly known as:  FLEXERIL Take 5 mg by mouth 3 (three) times daily as needed for muscle spasms.   doxycycline 100 MG tablet Commonly known as:  VIBRA-TABS Take 1 tablet (100 mg total) by mouth every 12 (twelve) hours for 5 days.   folic acid 1 MG tablet Commonly known as:  FOLVITE Take 1  mg by mouth daily.   guaiFENesin-dextromethorphan 100-10 MG/5ML syrup Commonly known as:  ROBITUSSIN DM Take 5 mLs by mouth every 4 (four) hours as needed for cough.   HYDROcodone-acetaminophen 5-325 MG tablet Commonly known as:  NORCO/VICODIN Take 1 tablet by mouth every 4 (four) hours as needed for up to 7 days for moderate pain.   hydroxychloroquine 200 MG tablet Commonly known as:  PLAQUENIL Take 200 mg by mouth daily.   lansoprazole 30 MG capsule Commonly known as:  PREVACID Take 30 mg by mouth daily.   levothyroxine 175 MCG tablet Commonly known as:  SYNTHROID, LEVOTHROID Take 175 mcg by mouth daily.   meloxicam 7.5 MG tablet Commonly known as:  MOBIC Take 7.5 mg by mouth 2 (two) times daily.   methotrexate 2.5 MG tablet Commonly known as:  RHEUMATREX Take 10 mg by mouth every Wednesday.   primidone 250 MG tablet Commonly known as:  MYSOLINE Take 250 mg by mouth 3 (three) times daily.   tamsulosin 0.4 MG Caps capsule Commonly known as:  FLOMAX Take 1 capsule (0.4 mg total)  by mouth daily.   timolol 0.5 % ophthalmic solution Commonly known as:  TIMOPTIC Place 1 drop into both eyes 2 (two) times daily.   triamterene-hydrochlorothiazide 75-50 MG tablet Commonly known as:  MAXZIDE Take 1 tablet by mouth daily.          Total Time in preparing paper work, data evaluation and todays exam - 55 minutes  Dustin Flock M.D on 09/15/2018 at [place]  [phone]

## 2018-09-15 NOTE — Evaluation (Signed)
Physical Therapy Evaluation Patient Details Name: Anthony Evans MRN: [id number] DOB: July 10, 1951 Today's Date: 09/15/2018   History of Present Illness  67 y.o. male with a known history of seizure, stroke, hypertension, hypothyroidism and arthritis. He tried to get up multiple times, couldn't and was on the floor ~5 hours.  CT reveals minimally displaced C2 fx.  Pt now wearing Aspen collar.  Clinical Impression  Pt showed good effort with PT and though he was able to get to sitting, standing and ambulation w/o direct assist he was slow and labored with all tasks.  Pt did well with gait training with walker and cane but because of R hip pain (related to this fall) he limped more than baseline and was hesitant to fully use it.  He did better with cane than walker secondary to inability to fully control R hand on walker to weight-bear.  Discussed with pt that he would certainly benefit from having at least intermittent assist initially along with home health services.  Seems that this is doable, pt will be able to return home but understands that he is limited and needs to be much more cautious initially.      Follow Up Recommendations Home health PT    Equipment Recommendations  (discussed setting cane to appropriate height at home)    Recommendations for Other Services       Precautions / Restrictions Precautions Precautions: Fall;Cervical Required Braces or Orthoses: Cervical Brace Cervical Brace: At all times      Mobility  Bed Mobility Overal bed mobility: Modified Independent             General bed mobility comments: Pt needed heavy use of rail to get to sitting EOB, light assist to fully scoot to EOB  Transfers Overall transfer level: Modified independent Equipment used: Straight cane             General transfer comment: Pt able to rise from standard height recliner using UEs on hand rails and SPC, heavy effort to do so but able to  rise  Ambulation/Gait Ambulation/Gait assistance: Supervision Gait Distance (Feet): 80 Feet Assistive device: Rolling walker (2 wheeled);Straight cane       General Gait Details: (~50 ft with walker, ~30 ft with SPC) Pt hesitant to put a lot of weight on the R LE and had limp that is more significant than his baseline.  He struggled with R UE on walker and was actually more stable with cane because of this.    Stairs            Wheelchair Mobility    Modified Rankin (Stroke Patients Only)       Balance Overall balance assessment: Modified Independent                                           Pertinent Vitals/Pain Pain Assessment: 0-10 Pain Score: 3  Pain Location: Pt reports more R hip/knee pain than in the neck    Home Living Family/patient expects to be discharged to:: Private residence Living Arrangements: Alone Available Help at Discharge: (says friend should be able to stay with him for a few days)   Home Access: Ramped entrance     Home Layout: One level Home Equipment: Grab bars - toilet;Walker - 2 wheels;Cane - quad;Cane - single point;Bedside commode(can re-attach bed rails )      Prior Function  Level of Independence: Independent         Comments: Pt reports he is able to take care of all his needs w/o assist     Hand Dominance        Extremity/Trunk Assessment   Upper Extremity Assessment Upper Extremity Assessment: (L WFL, 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))    Lower Extremity Assessment Lower Extremity Assessment: (L WFL, R chronic weakness, grossly 4/5, pain limited hip flx)       Communication   Communication: No difficulties  Cognition Arousal/Alertness: Awake/alert Behavior During Therapy: WFL for tasks assessed/performed Overall Cognitive Status: Within Functional Limits for tasks assessed                                        General Comments      Exercises      Assessment/Plan    PT Assessment Patient needs continued PT services  PT Problem List Decreased strength;Decreased activity tolerance;Decreased balance;Decreased mobility;Decreased coordination;Decreased knowledge of use of DME;Decreased safety awareness;Decreased range of motion;Pain       PT Treatment Interventions DME instruction;Gait training;Functional mobility training;Therapeutic exercise;Therapeutic activities;Balance training;Neuromuscular re-education;Patient/family education    PT Goals (Current goals can be found in the Care Plan section)  Acute Rehab PT Goals Patient Stated Goal: get back to independence as soon as possible PT Goal Formulation: With patient Time For Goal Achievement: 09/29/18 Potential to Achieve Goals: Fair    Frequency Min 2X/week   Barriers to discharge        Co-evaluation               AM-PAC PT "6 Clicks" Daily Activity  Outcome Measure Difficulty turning over in bed (including adjusting bedclothes, sheets and blankets)?: A Little Difficulty moving from lying on back to sitting on the side of the bed? : A Lot Difficulty sitting down on and standing up from a chair with arms (e.g., wheelchair, bedside commode, etc,.)?: A Little Help needed moving to and from a bed to chair (including a wheelchair)?: A Little Help needed walking in hospital room?: A Little Help needed climbing 3-5 steps with a railing? : A Lot 6 Click Score: 16    End of Session Equipment Utilized During Treatment: Gait belt;Cervical collar Activity Tolerance: Patient limited by pain;Patient limited by fatigue Patient left: in chair;with nursing/sitter in room;with call bell/phone within reach Nurse Communication: Mobility status PT Visit Diagnosis: Muscle weakness (generalized) (M62.81);Difficulty in walking, not elsewhere classified (R26.2);Pain Pain - Right/Left: Right Pain - part of body: Hip    Time: 4585-9292 PT Time Calculation (min) (ACUTE ONLY): 30  min   Charges:   PT Evaluation $PT Eval Low Complexity: 1 Low PT Treatments $Gait Training: 8-22 mins        Kreg Shropshire, DPT 09/15/2018, 5:36 PM

## 2018-09-15 NOTE — Care Management Obs Status (Signed)
[place] NOTIFICATION   Patient Details  Name: ZEB RAWL MRN: [id number] Date of Birth: July 16, 1951   Medicare Observation Status Notification Given:  Yes Patient asked that [facility] place X for his signature.   Marshell Garfinkel, RN 09/15/2018, 9:00 AM

## 2018-09-15 NOTE — Clinical Social Work Note (Signed)
Clinical Social Work Assessment  Patient Details  Name: Anthony Evans MRN: [id number] Date of Birth: [birth year]  Date of referral:  09/15/18               Reason for consult:  Discharge Planning                Permission sought to share information with:  Case Manager Permission granted to share information::  Yes, Verbal Permission Granted  Name::        Agency::     Relationship::     Contact Information:     Housing/Transportation Living arrangements for the past 2 months:  Single Family Home Source of Information:  Patient Patient Interpreter Needed:  None Criminal Activity/Legal Involvement Pertinent to Current Situation/Hospitalization:  No - Comment as needed Significant Relationships:  Siblings Lives with:  Self Do you feel safe going back to the place where you live?  Yes Need for family participation in patient care:  Yes (Comment)  Care giving concerns:  Patient lives alone in [place].    Social Worker assessment / plan:  Holiday representative (CSW) reviewed chart and noted that patient has a cervical fracture. Patient has a cervical collar. PT is pending. CSW met with patient alone at bedside to discuss D/C plan. Patient was alert and oriented X4 and was laying in the bed. CSW introduced self and explained role of CSW department. Per patient he lives alone and has 2 sisters and 1 brother that are very supportive. Per patient he never married and has no children. Per patient he is independent at baseline. Per patient he prefers to go home and does not want to go to SNF. Per patient he is open to home health if needed. CSW explained that [facility] will have to approve SNF. Patient verbalized his understanding and reported that he is going home. RN case manager aware of above. CSW will continue to follow and assist as needed.   Employment status:  Retired Nurse, adult PT Recommendations:  Not assessed at this time Information / Referral to community  resources:  Other (Comment Required)(Patient prefers to D/C home. )  Patient/Family's Response to care:  Patient prefers to D/C home.   Patient/Family's Understanding of and Emotional Response to Diagnosis, Current Treatment, and Prognosis:  Patient was very pleasant and thanked CSW for assistance.   Emotional Assessment Appearance:  Appears stated age Attitude/Demeanor/Rapport:    Affect (typically observed):  Accepting, Adaptable, Pleasant Orientation:  Oriented to Self, Oriented to Place, Oriented to  Time, Oriented to Situation Alcohol / Substance use:  Not Applicable Psych involvement (Current and /or in the community):  No (Comment)  Discharge Needs  Concerns to be addressed:  Discharge Planning Concerns Readmission within the last 30 days:  No Current discharge risk:  Chronically ill Barriers to Discharge:  Continued Medical Work up   [employer], Veronia Beets, LCSW 09/15/2018, 12:05 PM

## 2018-09-15 NOTE — Care Management Note (Addendum)
Case Management Note  Patient Details  Name: Anthony Evans MRN: [id number] Date of Birth: 25-Dec-1950  Subjective/Objective:                  RNCM met with patient that is in a cervical collar status post fall at home. He states he lives at home alone but has three sisters: Gabriel Earing. Neighbor Elenora Fender. He feels certain that they will help him when he gets home. Brother Shanon Brow that lives out of the area but patient states he would help too if needed.  He has a cane at home if needed. At baseline patient is able to drive and ambulate without assistance.  He is currently a patient at [facility] but transitioning to [facility] in December with Dr. Glendon Axe.  He uses [facility] for medications.  Action/Plan: Home health list provided for patient review. Patient is interested PT with [facility] [phone]. I spoke with Shanon Brow and he is now retired. Patient updated.   Expected Discharge Date:                  Expected Discharge Plan:     In-House Referral:     Discharge planning Services     Post Acute Care Choice:    Choice offered to:     DME Arranged:    DME Agency:     HH Arranged:    HH Agency:     Status of Service:     If discussed at [employer], dates discussed:    Additional Comments:  Marshell Garfinkel, RN 09/15/2018, 9:02 AM

## 2018-09-18 ENCOUNTER — Telehealth: Payer: Self-pay | Admitting: Physician Assistant

## 2018-09-18 ENCOUNTER — Telehealth: Payer: Self-pay

## 2018-09-18 NOTE — Telephone Encounter (Signed)
Verbal order was given to [facility] with [place]PC

## 2018-09-18 NOTE — Telephone Encounter (Signed)
Yes please give verbal ok

## 2018-09-18 NOTE — Telephone Encounter (Signed)
Transition Care Management Follow-up Telephone Call  Date of discharge and from where: [facility] on 09/15/18.   How have you been since you were released from the hospital? Still has some soreness in hip and back but it is getting better every day. No other s/s.  Any questions or concerns? No   Items Reviewed:  Did the pt receive and understand the discharge instructions provided? Yes   Medications obtained and verified? Pt states he will review these at [place] apt.  Any new allergies since your discharge? No   Dietary orders reviewed? Yes  Do you have support at home? Yes   Other (ie: DME, Home Health, etc) N/A  Functional Questionnaire: (I = Independent and D = Dependent) Bathing/Dressing- Currently has assistance.    Meal Prep- I  Eating- I  Maintaining continence- I  Transferring/Ambulation- I  Managing Meds- I   Follow up appointments reviewed:    PCP Hospital f/u appt confirmed? Yes  Scheduled to see [name] on 09/22/18 @ 1:20 PM.  [facility] f/u appt confirmed? Yes   Are transportation arrangements needed? No   If their condition worsens, is the pt aware to call  their PCP or go to the ED? Yes  Was the patient provided with contact information for the PCP's office or ED? Yes  Was the pt encouraged to call back with questions or concerns? Yes

## 2018-09-18 NOTE — Telephone Encounter (Signed)
Merry Proud with [facility] called needing verbal for PT for 12 tim,e a week for first week and 2 time for 2 weeks and 1 time for 2 weeks  CB# [phone]  Thanks Con Memos

## 2018-09-20 LAB — RNA QUALITATIVE: HIV 1 RNA Qualitative: 1

## 2018-09-20 LAB — HIV 1/2 AB DIFFERENTIATION
HIV 1 AB: NEGATIVE
HIV 2 Ab: NEGATIVE
Note: NEGATIVE

## 2018-09-20 LAB — HIV ANTIBODY (ROUTINE TESTING W REFLEX): HIV SCREEN 4TH GENERATION: REACTIVE — AB

## 2018-09-22 ENCOUNTER — Encounter: Payer: Self-pay | Admitting: Physician Assistant

## 2018-09-22 ENCOUNTER — Ambulatory Visit: Payer: Medicare Other | Admitting: Physician Assistant

## 2018-09-22 VITALS — BP 140/80 | HR 104 | Temp 98.7°F | Resp 16

## 2018-09-22 DIAGNOSIS — W19XXXD Unspecified fall, subsequent encounter: Secondary | ICD-10-CM

## 2018-09-22 DIAGNOSIS — S12190D Other displaced fracture of second cervical vertebra, subsequent encounter for fracture with routine healing: Secondary | ICD-10-CM

## 2018-09-22 DIAGNOSIS — IMO0001 Reserved for inherently not codable concepts without codable children: Secondary | ICD-10-CM

## 2018-09-22 DIAGNOSIS — Z9189 Other specified personal risk factors, not elsewhere classified: Secondary | ICD-10-CM

## 2018-09-22 MED ORDER — HYDROCODONE-ACETAMINOPHEN 5-325 MG PO TABS: 1.0000 | ORAL_TABLET | ORAL | 0 refills | Status: AC | PRN

## 2018-09-22 NOTE — Progress Notes (Signed)
Patient: Anthony Evans Male    DOB: 29-Dec-1950   67 y.o.   MRN: [id number] Visit Date: 09/22/2018  Today's Provider: Mar Daring, PA-C   Chief Complaint  Patient presents with  . Hospitalization Follow-up   Subjective:    HPI  Follow up Hospitalization  Patient was admitted to [facility] on 09/14/18 and discharged on 09/15/18. He was treated for fall. Treatment for this included CT scan-cervical spine showed C2 Fracture. He was stabilized and is to f/u with neurosurgery for repeat imaging to check for healing.  Telephone follow up was done on 09/18/18. He reports excellent compliance with treatment. He reports this condition is Improved. Scheduled to see Neurosurgeon on 10/12/18.  ------------------------------------------------------------------------------------     Allergies  Allergen Reactions  . Etanercept Nausea And Vomiting  . Etodolac Nausea And Vomiting  . Latex Rash  . Rubbing Alcohol [Isopropyl Alcohol] Rash     Current Outpatient Medications:  .  carbamazepine (TEGRETOL) 200 MG tablet, Take 400-600 mg by mouth See admin instructions. Take 2 tablets (400MG ) by mouth 3 times daily with meals and 3 tablets (600MG ) by mouth at bedtime, Disp: , Rfl:  .  cyclobenzaprine (FLEXERIL) 5 MG tablet, Take 5 mg by mouth 3 (three) times daily as needed for muscle spasms. , Disp: , Rfl:  .  folic acid (FOLVITE) 1 MG tablet, Take 1 mg by mouth daily., Disp: , Rfl:  .  HYDROcodone-acetaminophen (NORCO/VICODIN) 5-325 MG tablet, Take 1 tablet by mouth every 4 (four) hours as needed for up to 7 days for moderate pain., Disp: 30 tablet, Rfl: 0 .  hydroxychloroquine (PLAQUENIL) 200 MG tablet, Take 200 mg by mouth daily. , Disp: , Rfl:  .  levothyroxine (SYNTHROID, LEVOTHROID) 175 MCG tablet, Take 175 mcg by mouth daily. , Disp: , Rfl:  .  meloxicam (MOBIC) 7.5 MG tablet, Take 7.5 mg by mouth 2 (two) times daily. , Disp: , Rfl:  .  methotrexate (RHEUMATREX) 2.5 MG tablet,  Take 10 mg by mouth every Wednesday. , Disp: , Rfl:  .  primidone (MYSOLINE) 250 MG tablet, Take 250 mg by mouth 3 (three) times daily. , Disp: , Rfl:  .  tamsulosin (FLOMAX) 0.4 MG CAPS capsule, Take 1 capsule (0.4 mg total) by mouth daily., Disp: 30 capsule, Rfl: 0 .  timolol (TIMOPTIC) 0.5 % ophthalmic solution, Place 1 drop into both eyes 2 (two) times daily. , Disp: , Rfl: 2 .  triamterene-hydrochlorothiazide (MAXZIDE) 75-50 MG per tablet, Take 1 tablet by mouth daily. , Disp: , Rfl:  .  guaiFENesin-dextromethorphan (ROBITUSSIN DM) 100-10 MG/5ML syrup, Take 5 mLs by mouth every 4 (four) hours as needed for cough. (Patient not taking: Reported on 09/22/2018), Disp: 118 mL, Rfl: 0  Review of Systems  Constitutional: Positive for fatigue.  Respiratory: Negative.   Cardiovascular: Negative.   Gastrointestinal: Negative.   Musculoskeletal: Positive for arthralgias, back pain and neck pain.  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    Social History   Tobacco Use  . Smoking status: Never Smoker  . Smokeless tobacco: Never Used  Substance Use Topics  . Alcohol use: No   Objective:   BP 140/80 (BP Location: Left Arm, Patient Position: Sitting, Cuff Size: Normal)   Pulse (!) 104   Temp 98.7 F (37.1 C) (Oral)   Resp 16  Vitals:   09/22/18 1339  BP: 140/80  Pulse: (!) 104  Resp: 16  Temp: 98.7 F (37.1 C)  TempSrc: Oral     Physical Exam  Constitutional: He appears well-developed and well-nourished. No distress.  HENT:  Head: Normocephalic and atraumatic.  Neck: Neck supple.  In neck brace  Cardiovascular: Normal rate, regular rhythm and normal heart sounds. Exam reveals no gallop and no friction rub.  No murmur heard. Pulmonary/Chest: Effort normal and breath sounds normal. No respiratory distress. He has no wheezes. He has no rales.  Skin: He is not diaphoretic.  Vitals reviewed.      Assessment & Plan:     1. Transition of care performed with  sharing of clinical summary Hospitalization notes, imaging and labs reviewed from hospitalization from 09/14/18-09/15/18. Telephone transition call completed and reviewed on 09/18/18.   2. Fall, subsequent encounter Using walker now, wearing neck brace. Has f/u with Neurosurgery on 10/12/18. Pain medication refilled for now.  - HYDROcodone-acetaminophen (NORCO/VICODIN) 5-325 MG tablet; Take 1 tablet by mouth every 4 (four) hours as needed for up to 7 days for moderate pain.  Dispense: 30 tablet; Refill: 0  3. Other closed displaced fracture of second cervical vertebra with routine healing, subsequent encounter See above medical treatment plan. - HYDROcodone-acetaminophen (NORCO/VICODIN) 5-325 MG tablet; Take 1 tablet by mouth every 4 (four) hours as needed for up to 7 days for moderate pain.  Dispense: 30 tablet; Refill: 0       Mar Daring, PA-C  [facility]

## 2018-11-08 DIAGNOSIS — E039 Hypothyroidism, unspecified: Secondary | ICD-10-CM | POA: Insufficient documentation

## 2019-01-24 ENCOUNTER — Telehealth: Payer: Self-pay | Admitting: Physician Assistant

## 2019-01-24 ENCOUNTER — Telehealth: Payer: Self-pay | Admitting: *Deleted

## 2019-01-24 NOTE — Telephone Encounter (Signed)
Patient called office to let us know he changed providers due to his insurance preference .

## 2019-01-24 NOTE — Telephone Encounter (Signed)
I left a message asking the Anthony Evans to call and schedule an appointment.  (He is due for his Welcome to Medicare visit with [name] before 07/07/2019.) VDM (DD)

## 2019-01-24 NOTE — Telephone Encounter (Signed)
Patient was advised.  

## 2019-01-24 NOTE — Telephone Encounter (Signed)
Tell him thanks for letting us know. I am sad to know he had to switch but understand. If he ever needs [place], or wants to switch back I would be more than happy to take him.

## 2019-01-24 NOTE — Telephone Encounter (Signed)
FYI

## 2019-01-31 DIAGNOSIS — Z79899 Other long term (current) drug therapy: Secondary | ICD-10-CM | POA: Insufficient documentation

## 2019-05-04 ENCOUNTER — Other Ambulatory Visit (HOSPITAL_COMMUNITY): Payer: Self-pay | Admitting: Physical Medicine and Rehabilitation

## 2019-05-04 ENCOUNTER — Other Ambulatory Visit: Payer: Self-pay | Admitting: Physical Medicine and Rehabilitation

## 2019-05-04 DIAGNOSIS — M5416 Radiculopathy, lumbar region: Secondary | ICD-10-CM

## 2019-05-17 ENCOUNTER — Ambulatory Visit
Admission: RE | Admit: 2019-05-17 | Discharge: 2019-05-17 | Disposition: A | Discharge status: Home / Self Care | Payer: Medicare Other | Source: Ambulatory Visit | Attending: Physical Medicine and Rehabilitation | Admitting: Physical Medicine and Rehabilitation

## 2019-05-17 ENCOUNTER — Other Ambulatory Visit: Payer: Self-pay

## 2019-05-17 DIAGNOSIS — M5416 Radiculopathy, lumbar region: Secondary | ICD-10-CM | POA: Insufficient documentation

## 2019-11-04 ENCOUNTER — Emergency Department: Payer: Medicare Other

## 2019-11-04 ENCOUNTER — Inpatient Hospital Stay
Admission: EM | Admit: 2019-11-04 | Discharge: 2019-11-06 | DRG: 312 | Disposition: A | Discharge status: Home Health | Payer: Medicare Other | Attending: Internal Medicine | Admitting: Internal Medicine

## 2019-11-04 ENCOUNTER — Other Ambulatory Visit: Payer: Self-pay

## 2019-11-04 ENCOUNTER — Encounter: Payer: Self-pay | Admitting: Emergency Medicine

## 2019-11-04 DIAGNOSIS — E039 Hypothyroidism, unspecified: Secondary | ICD-10-CM | POA: Diagnosis present

## 2019-11-04 DIAGNOSIS — M069 Rheumatoid arthritis, unspecified: Secondary | ICD-10-CM | POA: Diagnosis present

## 2019-11-04 DIAGNOSIS — R55 Syncope and collapse: Principal | ICD-10-CM | POA: Diagnosis present

## 2019-11-04 DIAGNOSIS — M5442 Lumbago with sciatica, left side: Secondary | ICD-10-CM

## 2019-11-04 DIAGNOSIS — G459 Transient cerebral ischemic attack, unspecified: Secondary | ICD-10-CM | POA: Diagnosis not present

## 2019-11-04 DIAGNOSIS — I69351 Hemiplegia and hemiparesis following cerebral infarction affecting right dominant side: Secondary | ICD-10-CM

## 2019-11-04 DIAGNOSIS — Z79899 Other long term (current) drug therapy: Secondary | ICD-10-CM

## 2019-11-04 DIAGNOSIS — G40909 Epilepsy, unspecified, not intractable, without status epilepticus: Secondary | ICD-10-CM | POA: Diagnosis present

## 2019-11-04 DIAGNOSIS — N401 Enlarged prostate with lower urinary tract symptoms: Secondary | ICD-10-CM | POA: Diagnosis present

## 2019-11-04 DIAGNOSIS — L4050 Arthropathic psoriasis, unspecified: Secondary | ICD-10-CM | POA: Diagnosis present

## 2019-11-04 DIAGNOSIS — Z20828 Contact with and (suspected) exposure to other viral communicable diseases: Secondary | ICD-10-CM | POA: Diagnosis present

## 2019-11-04 DIAGNOSIS — Z0184 Encounter for antibody response examination: Secondary | ICD-10-CM

## 2019-11-04 DIAGNOSIS — E785 Hyperlipidemia, unspecified: Secondary | ICD-10-CM | POA: Diagnosis present

## 2019-11-04 DIAGNOSIS — R338 Other retention of urine: Secondary | ICD-10-CM | POA: Diagnosis not present

## 2019-11-04 DIAGNOSIS — K219 Gastro-esophageal reflux disease without esophagitis: Secondary | ICD-10-CM | POA: Diagnosis present

## 2019-11-04 DIAGNOSIS — E781 Pure hyperglyceridemia: Secondary | ICD-10-CM | POA: Diagnosis present

## 2019-11-04 DIAGNOSIS — Z96652 Presence of left artificial knee joint: Secondary | ICD-10-CM | POA: Diagnosis present

## 2019-11-04 DIAGNOSIS — Z23 Encounter for immunization: Secondary | ICD-10-CM

## 2019-11-04 DIAGNOSIS — Z79890 Hormone replacement therapy (postmenopausal): Secondary | ICD-10-CM

## 2019-11-04 DIAGNOSIS — W19XXXA Unspecified fall, initial encounter: Secondary | ICD-10-CM | POA: Diagnosis present

## 2019-11-04 DIAGNOSIS — R29703 NIHSS score 3: Secondary | ICD-10-CM | POA: Diagnosis present

## 2019-11-04 DIAGNOSIS — G8929 Other chronic pain: Secondary | ICD-10-CM

## 2019-11-04 DIAGNOSIS — M5136 Other intervertebral disc degeneration, lumbar region: Secondary | ICD-10-CM | POA: Diagnosis present

## 2019-11-04 DIAGNOSIS — Z981 Arthrodesis status: Secondary | ICD-10-CM

## 2019-11-04 DIAGNOSIS — I10 Essential (primary) hypertension: Secondary | ICD-10-CM | POA: Diagnosis present

## 2019-11-04 DIAGNOSIS — Z888 Allergy status to other drugs, medicaments and biological substances status: Secondary | ICD-10-CM

## 2019-11-04 DIAGNOSIS — R4701 Aphasia: Secondary | ICD-10-CM | POA: Diagnosis present

## 2019-11-04 DIAGNOSIS — J302 Other seasonal allergic rhinitis: Secondary | ICD-10-CM | POA: Diagnosis present

## 2019-11-04 LAB — GLUCOSE, CAPILLARY: Glucose-Capillary: 97 mg/dL (ref 70–99)

## 2019-11-04 NOTE — ED Triage Notes (Signed)
Patient brought in by ems from home. Patient states that he was standing at the sink brushing his teeth and fell backwards. Patient denies any pain. Patient states that he does not recall hitting his head but a red area noted to the back of his head. Patient has not been able to stand since the fall.

## 2019-11-05 ENCOUNTER — Emergency Department: Payer: Medicare Other

## 2019-11-05 ENCOUNTER — Inpatient Hospital Stay: Payer: Medicare Other

## 2019-11-05 ENCOUNTER — Inpatient Hospital Stay: Admit: 2019-11-05 | Payer: Medicare Other

## 2019-11-05 ENCOUNTER — Inpatient Hospital Stay
Admit: 2019-11-05 | Discharge: 2019-11-05 | Disposition: A | Discharge status: Home / Self Care | Payer: Medicare Other | Attending: Emergency Medicine | Admitting: Emergency Medicine

## 2019-11-05 DIAGNOSIS — Z0184 Encounter for antibody response examination: Secondary | ICD-10-CM | POA: Diagnosis not present

## 2019-11-05 DIAGNOSIS — Z20828 Contact with and (suspected) exposure to other viral communicable diseases: Secondary | ICD-10-CM | POA: Diagnosis present

## 2019-11-05 DIAGNOSIS — M5136 Other intervertebral disc degeneration, lumbar region: Secondary | ICD-10-CM | POA: Diagnosis present

## 2019-11-05 DIAGNOSIS — K219 Gastro-esophageal reflux disease without esophagitis: Secondary | ICD-10-CM | POA: Diagnosis present

## 2019-11-05 DIAGNOSIS — L4050 Arthropathic psoriasis, unspecified: Secondary | ICD-10-CM | POA: Diagnosis present

## 2019-11-05 DIAGNOSIS — E039 Hypothyroidism, unspecified: Secondary | ICD-10-CM | POA: Diagnosis present

## 2019-11-05 DIAGNOSIS — Z79899 Other long term (current) drug therapy: Secondary | ICD-10-CM | POA: Diagnosis not present

## 2019-11-05 DIAGNOSIS — Z23 Encounter for immunization: Secondary | ICD-10-CM | POA: Diagnosis not present

## 2019-11-05 DIAGNOSIS — J302 Other seasonal allergic rhinitis: Secondary | ICD-10-CM | POA: Diagnosis present

## 2019-11-05 DIAGNOSIS — R55 Syncope and collapse: Principal | ICD-10-CM

## 2019-11-05 DIAGNOSIS — I10 Essential (primary) hypertension: Secondary | ICD-10-CM | POA: Diagnosis present

## 2019-11-05 DIAGNOSIS — G459 Transient cerebral ischemic attack, unspecified: Secondary | ICD-10-CM | POA: Diagnosis present

## 2019-11-05 DIAGNOSIS — R338 Other retention of urine: Secondary | ICD-10-CM | POA: Diagnosis not present

## 2019-11-05 DIAGNOSIS — R29703 NIHSS score 3: Secondary | ICD-10-CM | POA: Diagnosis present

## 2019-11-05 DIAGNOSIS — Z96652 Presence of left artificial knee joint: Secondary | ICD-10-CM | POA: Diagnosis present

## 2019-11-05 DIAGNOSIS — N401 Enlarged prostate with lower urinary tract symptoms: Secondary | ICD-10-CM | POA: Diagnosis present

## 2019-11-05 DIAGNOSIS — Z981 Arthrodesis status: Secondary | ICD-10-CM | POA: Diagnosis not present

## 2019-11-05 DIAGNOSIS — Z79890 Hormone replacement therapy (postmenopausal): Secondary | ICD-10-CM | POA: Diagnosis not present

## 2019-11-05 DIAGNOSIS — G40909 Epilepsy, unspecified, not intractable, without status epilepticus: Secondary | ICD-10-CM | POA: Diagnosis present

## 2019-11-05 DIAGNOSIS — E781 Pure hyperglyceridemia: Secondary | ICD-10-CM | POA: Diagnosis present

## 2019-11-05 DIAGNOSIS — E785 Hyperlipidemia, unspecified: Secondary | ICD-10-CM | POA: Diagnosis present

## 2019-11-05 DIAGNOSIS — I69351 Hemiplegia and hemiparesis following cerebral infarction affecting right dominant side: Secondary | ICD-10-CM | POA: Diagnosis not present

## 2019-11-05 DIAGNOSIS — M069 Rheumatoid arthritis, unspecified: Secondary | ICD-10-CM | POA: Diagnosis present

## 2019-11-05 DIAGNOSIS — Z888 Allergy status to other drugs, medicaments and biological substances status: Secondary | ICD-10-CM | POA: Diagnosis not present

## 2019-11-05 DIAGNOSIS — R4701 Aphasia: Secondary | ICD-10-CM | POA: Diagnosis present

## 2019-11-05 DIAGNOSIS — W19XXXA Unspecified fall, initial encounter: Secondary | ICD-10-CM | POA: Diagnosis present

## 2019-11-05 LAB — ECHOCARDIOGRAM COMPLETE
Height: 70 in
Weight: 3200 oz

## 2019-11-05 LAB — CBC WITH DIFFERENTIAL/PLATELET
Abs Immature Granulocytes: 0.03 10*3/uL (ref 0.00–0.07)
Basophils Absolute: 0 10*3/uL (ref 0.0–0.1)
Basophils Relative: 1 %
Eosinophils Absolute: 0.1 10*3/uL (ref 0.0–0.5)
Eosinophils Relative: 1 %
HCT: 31.9 % — ABNORMAL LOW (ref 39.0–52.0)
Hemoglobin: 10.3 g/dL — ABNORMAL LOW (ref 13.0–17.0)
Immature Granulocytes: 0 %
Lymphocytes Relative: 16 %
Lymphs Abs: 1.1 10*3/uL (ref 0.7–4.0)
MCH: 22.9 pg — ABNORMAL LOW (ref 26.0–34.0)
MCHC: 32.3 g/dL (ref 30.0–36.0)
MCV: 70.9 fL — ABNORMAL LOW (ref 80.0–100.0)
Monocytes Absolute: 0.6 10*3/uL (ref 0.1–1.0)
Monocytes Relative: 9 %
Neutro Abs: 4.9 10*3/uL (ref 1.7–7.7)
Neutrophils Relative %: 73 %
Platelets: 349 10*3/uL (ref 150–400)
RBC: 4.5 MIL/uL (ref 4.22–5.81)
RDW: 18.6 % — ABNORMAL HIGH (ref 11.5–15.5)
WBC: 6.8 10*3/uL (ref 4.0–10.5)
nRBC: 0 % (ref 0.0–0.2)

## 2019-11-05 LAB — BASIC METABOLIC PANEL
Anion gap: 13 (ref 5–15)
BUN: 30 mg/dL — ABNORMAL HIGH (ref 8–23)
CO2: 28 mmol/L (ref 22–32)
Calcium: 9.6 mg/dL (ref 8.9–10.3)
Chloride: 98 mmol/L (ref 98–111)
Creatinine, Ser: 0.72 mg/dL (ref 0.61–1.24)
GFR calc Af Amer: >60 mL/min (ref >60)
GFR calc non Af Amer: >60 mL/min (ref >60)
Glucose, Bld: 118 mg/dL — ABNORMAL HIGH (ref 70–99)
Potassium: 3.5 mmol/L (ref 3.5–5.1)
Sodium: 139 mmol/L (ref 135–145)

## 2019-11-05 LAB — ETHANOL: Alcohol, Ethyl (B): <10 mg/dL (ref <10)

## 2019-11-05 LAB — TSH: TSH: 0.909 u[IU]/mL (ref 0.350–4.500)

## 2019-11-05 LAB — LIPID PANEL
Cholesterol: 163 mg/dL (ref 0–200)
HDL: 32 mg/dL — ABNORMAL LOW (ref >40)
LDL Cholesterol: 55 mg/dL (ref 0–99)
Total CHOL/HDL Ratio: 5.1 RATIO
Triglycerides: 380 mg/dL — ABNORMAL HIGH (ref <150)
VLDL: 76 mg/dL — ABNORMAL HIGH (ref 0–40)

## 2019-11-05 LAB — HIV ANTIBODY (ROUTINE TESTING W REFLEX): HIV Screen 4th Generation wRfx: REACTIVE — AB

## 2019-11-05 LAB — IRON AND TIBC
Iron: 50 ug/dL (ref 45–182)
Saturation Ratios: 22 % (ref 17.9–39.5)
TIBC: 230 ug/dL — ABNORMAL LOW (ref 250–450)
UIBC: 180 ug/dL

## 2019-11-05 LAB — SARS CORONAVIRUS 2 (TAT 6-24 HRS): SARS Coronavirus 2: NEGATIVE

## 2019-11-05 LAB — VITAMIN B12: Vitamin B-12: 544 pg/mL (ref 180–914)

## 2019-11-05 LAB — FERRITIN: Ferritin: 67 ng/mL (ref 24–336)

## 2019-11-05 LAB — GLUCOSE, CAPILLARY: Glucose-Capillary: 123 mg/dL — ABNORMAL HIGH (ref 70–99)

## 2019-11-05 MED ORDER — LEVOTHYROXINE SODIUM 50 MCG PO TABS: 175.0000 ug | ORAL_TABLET | Freq: Every day | ORAL | Status: DC

## 2019-11-05 MED ORDER — ACETAMINOPHEN 325 MG PO TABS: 650.0000 mg | ORAL_TABLET | ORAL | Status: DC | PRN

## 2019-11-05 MED ORDER — SODIUM CHLORIDE 0.9% FLUSH
3.0000 mL | Freq: Two times a day (BID) | INTRAVENOUS | Status: DC
  Administered 2019-11-05 – 2019-11-06 (×2): 3 mL via INTRAVENOUS

## 2019-11-05 MED ORDER — SODIUM CHLORIDE 0.9 % IV SOLN
INTRAVENOUS | Status: DC
  Administered 2019-11-05 – 2019-11-06 (×3): via INTRAVENOUS

## 2019-11-05 MED ORDER — ADULT MULTIVITAMIN W/MINERALS CH
1.0000 | ORAL_TABLET | Freq: Every day | ORAL | Status: DC
  Administered 2019-11-05 – 2019-11-06 (×2): 1 via ORAL
  Filled 2019-11-05 (×2): qty 1

## 2019-11-05 MED ORDER — ACETAMINOPHEN 650 MG RE SUPP: 650.0000 mg | RECTAL | Status: DC | PRN

## 2019-11-05 MED ORDER — MELOXICAM 7.5 MG PO TABS
7.5000 mg | ORAL_TABLET | Freq: Two times a day (BID) | ORAL | Status: DC
  Administered 2019-11-05 – 2019-11-06 (×3): 7.5 mg via ORAL
  Filled 2019-11-05 (×3): qty 1

## 2019-11-05 MED ORDER — CARBAMAZEPINE 200 MG PO TABS
600.0000 mg | ORAL_TABLET | Freq: Every day | ORAL | Status: DC
  Administered 2019-11-05: 600 mg via ORAL
  Filled 2019-11-05 (×2): qty 3

## 2019-11-05 MED ORDER — LEVOTHYROXINE SODIUM 50 MCG PO TABS
175.0000 ug | ORAL_TABLET | Freq: Every day | ORAL | Status: DC
  Administered 2019-11-05 – 2019-11-06 (×2): 175 ug via ORAL
  Filled 2019-11-05 (×3): qty 4

## 2019-11-05 MED ORDER — HEPARIN SODIUM (PORCINE) 5000 UNIT/ML IJ SOLN
5000.0000 [IU] | Freq: Three times a day (TID) | INTRAMUSCULAR | Status: DC
  Administered 2019-11-05 – 2019-11-06 (×4): 5000 [IU] via SUBCUTANEOUS
  Filled 2019-11-05 (×4): qty 1

## 2019-11-05 MED ORDER — FOLIC ACID 1 MG PO TABS
1.0000 mg | ORAL_TABLET | Freq: Every day | ORAL | Status: DC
  Administered 2019-11-05 – 2019-11-06 (×2): 1 mg via ORAL
  Filled 2019-11-05 (×2): qty 1

## 2019-11-05 MED ORDER — SENNOSIDES-DOCUSATE SODIUM 8.6-50 MG PO TABS: 1.0000 | ORAL_TABLET | Freq: Every evening | ORAL | Status: DC | PRN

## 2019-11-05 MED ORDER — ACETAMINOPHEN 160 MG/5ML PO SOLN
650.0000 mg | ORAL | Status: DC | PRN
  Filled 2019-11-05: qty 20.3

## 2019-11-05 MED ORDER — PNEUMOCOCCAL VAC POLYVALENT 25 MCG/0.5ML IJ INJ: 0.5000 mL | INJECTION | INTRAMUSCULAR | Status: DC

## 2019-11-05 MED ORDER — VITAMIN B-12 100 MCG PO TABS
100.0000 ug | ORAL_TABLET | Freq: Every day | ORAL | Status: DC
  Administered 2019-11-05 – 2019-11-06 (×2): 100 ug via ORAL
  Filled 2019-11-05 (×2): qty 1

## 2019-11-05 MED ORDER — ALPHA-LIPOIC ACID 150 MG PO CAPS: 150.0000 mg | ORAL_CAPSULE | Freq: Every day | ORAL | Status: DC

## 2019-11-05 MED ORDER — VITAMIN C 500 MG PO TABS
1000.0000 mg | ORAL_TABLET | Freq: Every day | ORAL | Status: DC
  Administered 2019-11-05 – 2019-11-06 (×2): 1000 mg via ORAL
  Filled 2019-11-05 (×2): qty 2

## 2019-11-05 MED ORDER — CARBAMAZEPINE 200 MG PO TABS: 400.0000 mg | ORAL_TABLET | ORAL | Status: DC

## 2019-11-05 MED ORDER — TAMSULOSIN HCL 0.4 MG PO CAPS
0.4000 mg | ORAL_CAPSULE | Freq: Every day | ORAL | Status: DC
  Administered 2019-11-05 – 2019-11-06 (×2): 0.4 mg via ORAL
  Filled 2019-11-05 (×2): qty 1

## 2019-11-05 MED ORDER — STROKE: EARLY STAGES OF RECOVERY BOOK: Freq: Once | Status: DC

## 2019-11-05 MED ORDER — GUAIFENESIN-DM 100-10 MG/5ML PO SYRP: 5.0000 mL | ORAL_SOLUTION | ORAL | Status: DC | PRN

## 2019-11-05 MED ORDER — HYDROXYCHLOROQUINE SULFATE 200 MG PO TABS
200.0000 mg | ORAL_TABLET | Freq: Every day | ORAL | Status: DC
  Administered 2019-11-05 – 2019-11-06 (×2): 200 mg via ORAL
  Filled 2019-11-05 (×2): qty 1

## 2019-11-05 MED ORDER — CARBAMAZEPINE 200 MG PO TABS
400.0000 mg | ORAL_TABLET | Freq: Three times a day (TID) | ORAL | Status: DC
  Administered 2019-11-05 – 2019-11-06 (×5): 400 mg via ORAL
  Filled 2019-11-05 (×8): qty 2

## 2019-11-05 MED ORDER — METHOTREXATE 2.5 MG PO TABS: 10.0000 mg | ORAL_TABLET | ORAL | Status: DC

## 2019-11-05 MED ORDER — CYCLOBENZAPRINE HCL 10 MG PO TABS: 5.0000 mg | ORAL_TABLET | Freq: Three times a day (TID) | ORAL | Status: DC | PRN

## 2019-11-05 MED ORDER — PRIMIDONE 250 MG PO TABS
250.0000 mg | ORAL_TABLET | Freq: Three times a day (TID) | ORAL | Status: DC
  Administered 2019-11-05 – 2019-11-06 (×4): 250 mg via ORAL
  Filled 2019-11-05 (×6): qty 1

## 2019-11-05 NOTE — Progress Notes (Signed)
eeg completed ° °

## 2019-11-05 NOTE — Progress Notes (Signed)
OT Cancellation Note  Patient Details Name: Anthony Evans MRN: [id number] DOB: 1951/10/17   Cancelled Treatment:    Reason Eval/Treat Not Completed: Patient at procedure or test/ unavailable OT order received and chart reviewed. Pt working with PT when OT presents for evaluation. Will f/u as able for OT evaluation.  Gerrianne Scale, [place], OTR/L ascom [phone] 11/05/19, 3:49 PM

## 2019-11-05 NOTE — Progress Notes (Signed)
*  PRELIMINARY RESULTS* Echocardiogram 2D Echocardiogram has been performed.  Anthony Evans 11/05/2019, 3:20 PM

## 2019-11-05 NOTE — ED Notes (Signed)
Admitting MD at bedside.

## 2019-11-05 NOTE — Procedures (Signed)
ELECTROENCEPHALOGRAM REPORT   Patient: Anthony Evans       Room #: [id number] EEG No. ID: [id number] Age: 68 y.o.        Sex: male Referring Physician: Mal Misty Report Date:  11/05/2019        Interpreting Physician: Alexis Goodell  History: VARUN FOWLKS is an 68 y.o. male with syncope  Medications:  Tegretol, Plaquenil. Synthroid, Methotrexate, MVI, Mysoline, Flomax,   Conditions of Recording:  This is a 21 channel routine scalp EEG performed with bipolar and monopolar montages arranged in accordance to the international 10/20 system of electrode placement. One channel was dedicated to EKG recording.  The patient is in the awake and drowsy states.  Description:  The waking background reaches a maximum 7-8 Hz low voltage, symmetrical, fairly well organized theta activity, seen from the parieto-occipital and posterior temporal regions.  This maximum frequency is not sustained and often the posterior background rhythm is noted to be slower.  Low voltage fast activity, poorly organized, is seen anteriorly and is at times superimposed on more posterior regions.  A mixture of theta and alpha rhythms are seen from the central and temporal regions. The patient drowses frequently with slowing to irregular, low voltage theta and beta activity.   Stage II sleep is not obtained. No epileptiform activity is noted.   Hyperventilation was not performed.  Intermittent photic stimulation was performed but failed to illicit any change in the tracing.    IMPRESSION: This is an abnormal EEG secondary to posterior background slowing.  This finding may be seen with a diffuse gray matter disturbance that is etiologically nonspecific, but may include a dementia or other static encephalopathy, among other possibilities. No epileptiform activity is noted.     Alexis Goodell, MD Neurology [phone] 11/05/2019, 12:24 PM

## 2019-11-05 NOTE — Consult Note (Signed)
[facility] Tele[facility]urology Consult Services   Date of Service:   11/05/2019 00:06:52  Impression:     .  Rule Out Acute Ischemic Stroke  Comments/Sign-Out: Pt may have had b/l LE weakness preventing him from getting up. He already has rt LE weakness from previous stroke. The localization of the ? transient left LE weakness could be from the brain or spine. 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 Pt not a tPA candidate as he does not feel any new deficits at the time of NIHSS assessment. ================== FANG-D LVO stroke screen : Field cut -ve Aphasia -ve Neglect -ve Gaze preference -ve Dense hemiparesis: (antigravity, without arm falling to bed before a count to five) -ve  Mechanism of Stroke: Not Clear  Metrics: Last Known Well: 11/04/2019 21:30:52 [facility] Notification Time: 11/05/2019 00:06:52 Arrival Time: 11/04/2019 23:15:52 Stamp Time: 11/05/2019 00:06:52 Time First Login Attempt: 11/05/2019 00:10:28 Video Start Time: 11/05/2019 00:10:28  Symptoms: b/l LE weakness NIHSS Start Assessment Time: 11/05/2019 00:17:31 Patient is not a candidate for Alteplase/Activase. Patient was not deemed candidate for Alteplase/Activase thrombolytics because of Resolved symptoms (no residual disabling symptoms). Video End Time: 11/05/2019 00:32:29  CT head showed no acute hemorrhage or acute core infarct.  Clinical Presentation is not Suggestive of Large Vessel Occlusive Disease  Radiologist was not called back for review of advanced imaging because NA ED Physician notified of diagnostic impression and management plan on 11/05/2019 00:36:18  Our recommendations are outlined below.  Recommendations:     .  Activate Stroke Protocol Admission/Order Set     .  Stroke/Telemetry Floor     .  Neuro Checks     .  Bedside Swallow Eval     .  DVT Prophylaxis     .  IV Fluids, Normal Saline     .  Head of Bed 30 Degrees     .  Euglycemia and Avoid  Hyperthermia (PRN Acetaminophen)     .  Antiplatelet Therapy Recommended     .  MRI brain wo     .  MRI T and L spine wo  Routine Consultation with [facility] for Follow up Care  Sign Out:     .  Discussed with Emergency Department Provider    ------------------------------------------------------------------------------  History of Present Illness: Patient is a 68 year old Male.  Patient was brought by EMS for symptoms of b/l LE weakness  Patient seen in ED Arrival time: 23:15 Information obtained from patient h/o CVA with rt sided weakness (in [place]), sz d/o (last sz 20 yrs back(, degenerative disc disease in the lumbar area Chronology: LKW 21:30 pt was brushing teeth when he fell back. Pt says normally after falling he would be able to get up, but today he was not able to. He mentions that the rt sided deficits on the NIHSS was old. Medications:carbamazepine, BP:158/95 P:86 Blood glucose:97  Last seen normal was within 4.5 hours. There is no history of hemorrhagic complications or intracranial hemorrhage. There is no history of Recent Anticoagulants. There is no history of recent major surgery. There is no history of recent stroke.  Past Medical History:     . Hypertension     . Stroke     . There is NO history of Diabetes Mellitus     . There is NO history of Hyperlipidemia     . There is NO history of Atrial Fibrillation     . There is NO history of Coronary Artery Disease  Anticoagulant use:  No  Antiplatelet use: No  Examination: BP(158/95), Pulse(86), Blood Glucose(97) 1A: Level of Consciousness - Alert; keenly responsive + 0 1B: Ask Month and Age - Both Questions Right + 0 1C: Blink Eyes & Squeeze Hands - Performs Both Tasks + 0 2: Test Horizontal Extraocular Movements - Normal + 0 3: Test Visual Fields - No Visual Loss + 0 4: Test Facial Palsy (Use Grimace if Obtunded) - Normal symmetry + 0 5A: Test Left Arm Motor Drift - No Drift for 10 Seconds +  0 5B: Test Right Arm Motor Drift - Drift, but doesn't hit bed + 1 6A: Test Left Leg Motor Drift - No Drift for 5 Seconds + 0 6B: Test Right Leg Motor Drift - Drift, but doesn't hit bed + 1 7: Test Limb Ataxia (FNF/Heel-Shin) - No Ataxia + 0 8: Test Sensation - Mild-Moderate Loss: Less Sharp/More Dull + 1 9: Test Language/Aphasia - Normal; No aphasia + 0 10: Test Dysarthria - Normal + 0 11: Test Extinction/Inattention - No abnormality + 0  NIHSS Score: 3  Pre-Morbid Modified Ranking Scale: 2 Points = Slight disability; unable to carry out all previous activities, but able to look after own affairs without assistance   Patient/Family was informed the Neurology Consult would happen via TeleHealth consult by way of interactive audio and video telecommunications and consented to receiving care in this manner.   Due to the immediate potential for life-threatening deterioration due to underlying acute neurologic illness, I spent 35 minutes providing critical care. This time includes time for face to face visit via telemedicine, review of medical records, imaging studies and discussion of findings with providers, the patient and/or family.   Dr Suzi Roots Macallister [facility]   [facility] [phone]   Case [id number]

## 2019-11-05 NOTE — Consult Note (Signed)
Reason for Consult:Fall Referring Physician: Mal Misty  CC: Fall  HPI: Anthony Evans is an 68 y.o. male with a history of stroke on no listed antiplatelet therapy at home who presents after a fall.  Patient reports that he was brushing his teeth on the day of admission and when he wnet to stand up he fell backward hitting his head.  He does not report any loss of consciousness but reports that he was unable to get himself up.  Has residual right sided weakness from his previous infarct and felt that there was some left leg weakness as well that he feels has now resolved.  Patient since admission has had some urinary retention as well.  He reports this happened with his falls in the past as well.    Past Medical History:  Diagnosis Date  . Allergy   . Arthritis    psoriatic  . Back pain   . Cataract   . Degenerative disc disease   . GERD (gastroesophageal reflux disease)   . Hypertension   . Reflux   . Seizures ([place])    last seizure about 10 years ago  . 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 Thalassemia   . Thyroid disease     Past Surgical History:  Procedure Laterality Date  . BACK SURGERY     screws and plate in c4 and c5  . CATARACT EXTRACTION W/PHACO Left 05/15/2015   Procedure: CATARACT EXTRACTION PHACO AND INTRAOCULAR LENS PLACEMENT (IOC);  Surgeon: Lyla Glassing, MD;  Location: [facility] ORS;  Service: Ophthalmology;  Laterality: Left;  [place]: 2:40.9 AP: 17.3 CDE: 27.88  . CERVICAL FUSION    . COLONOSCOPY WITH PROPOFOL N/A 06/14/2016   Procedure: COLONOSCOPY WITH PROPOFOL;  Surgeon: Manya Silvas, MD;  Location: [facility] ENDOSCOPY;  Service: Endoscopy;  Laterality: N/A;  . CORRECTION HAMMER TOE    . EYE SURGERY Right    cataract  . FRACTURE SURGERY    . HIP FRACTURE SURGERY Right    pins in hip  . JOINT REPLACEMENT    . KNEE ARTHROPLASTY Left    total knee replacement    Family History  Problem Relation Age of Onset  . Cancer Other   . Diabetes Other   . Arthritis Other      Social History:  reports that he has never smoked. He has never used smokeless tobacco. He reports that he does not drink alcohol. No history on file for drug.  Allergies  Allergen Reactions  . Etanercept Nausea And Vomiting  . Etodolac Nausea And Vomiting  . Rubbing Alcohol [Isopropyl Alcohol] Rash    Medications:  Prior to Admission medications   Medication Sig Start Date End Date Taking? Authorizing Provider  Alpha-Lipoic Acid 150 MG CAPS Take 150 mg by mouth daily.   Yes [provider]  Ascorbic Acid (VITAMIN C) 1000 MG tablet Take 1,000 mg by mouth daily.   Yes [provider]  carbamazepine (TEGRETOL) 200 MG tablet Take 400-600 mg by mouth See admin instructions. Take 2 tablets (400MG ) by mouth 3 times daily with meals and 3 tablets (600MG ) by mouth at bedtime   Yes [provider]  Cyanocobalamin (VITAMIN B 12) 100 MCG LOZG Take 100 mcg by mouth daily.   Yes [provider]  cyclobenzaprine (FLEXERIL) 5 MG tablet Take 5 mg by mouth 3 (three) times daily as needed for muscle spasms.    Yes [provider]  folic acid (FOLVITE) 1 MG  tablet Take 1 mg by mouth daily.   Yes [provider]  guaiFENesin-dextromethorphan (ROBITUSSIN DM) 100-10 MG/5ML syrup Take 5 mLs by mouth every 4 (four) hours as needed for cough. 09/15/18  Yes Dustin Flock, MD  hydroxychloroquine (PLAQUENIL) 200 MG tablet Take 200 mg by mouth daily.  02/23/18  Yes [provider]  levothyroxine (SYNTHROID, LEVOTHROID) 175 MCG tablet Take 175 mcg by mouth daily.  04/24/14  Yes [provider]  meloxicam (MOBIC) 7.5 MG tablet Take 7.5 mg by mouth 2 (two) times daily.    Yes [provider]  methotrexate (RHEUMATREX) 2.5 MG tablet Take 10 mg by mouth every Wednesday.  12/26/17  Yes [provider]  Multiple Vitamins-Minerals (CENTRUM SILVER 50+WOMEN) TABS Take 1 tablet by mouth daily.   Yes [provider]  primidone  (MYSOLINE) 250 MG tablet Take 250 mg by mouth 3 (three) times daily.    Yes [provider]  tamsulosin (FLOMAX) 0.4 MG CAPS capsule Take 1 capsule (0.4 mg total) by mouth daily. 09/15/18  Yes Dustin Flock, MD  timolol (TIMOPTIC) 0.5 % ophthalmic solution Place 1 drop into both eyes 2 (two) times daily.  12/29/17  Yes [provider]  triamcinolone cream (KENALOG) 0.1 % Apply 1 application topically See admin instructions. 01/21/15  Yes [provider]  triamterene-hydrochlorothiazide (MAXZIDE) 75-50 MG per tablet Take 1 tablet by mouth daily.    Yes [provider]  vitamin E 400 UNIT capsule Take 400 Units by mouth daily.   Yes [provider]     ROS: History obtained from the patient  General ROS: negative for - chills, fatigue, fever, night sweats, weight gain or weight loss Psychological ROS: negative for - behavioral disorder, hallucinations, memory difficulties, mood swings or suicidal ideation Ophthalmic ROS: negative for - blurry vision, double vision, eye pain or loss of vision ENT ROS: negative for - epistaxis, nasal discharge, oral lesions, sore throat, tinnitus or vertigo Allergy and Immunology ROS: negative for - hives or itchy/watery eyes Hematological and Lymphatic ROS: negative for - bleeding problems, bruising or swollen lymph nodes Endocrine ROS: negative for - galactorrhea, hair pattern changes, polydipsia/polyuria or temperature intolerance Respiratory ROS: negative for - cough, hemoptysis, shortness of breath or wheezing Cardiovascular ROS: negative for - chest pain, dyspnea on exertion, edema or irregular heartbeat Gastrointestinal ROS: negative for - abdominal pain, diarrhea, hematemesis, nausea/vomiting or stool incontinence Genito-Urinary ROS: urinary retention Musculoskeletal ROS: joint pain Neurological ROS: as noted in HPI Dermatological ROS: negative for rash and skin lesion changes  Physical Examination: Blood  pressure 137/86, pulse 82, temperature 98.2 F (36.8 C), temperature source Oral, resp. rate 18, height 5\' 10"  (1.778 m), weight 90.7 kg, SpO2 98 %.  HEENT-  Normocephalic, no lesions, without obvious abnormality.  Normal external eye and conjunctiva.  Normal TM's bilaterally.  Normal auditory canals and external ears. Normal external nose, mucus membranes and septum.  Normal pharynx. Cardiovascular- S1, S2 normal, pulses palpable throughout   Lungs- chest clear, no wheezing, rales, normal symmetric air entry Abdomen- soft, non-tender; bowel sounds normal; no masses,  no organomegaly Extremities- no edema Lymph-no adenopathy palpable Musculoskeletal-joint deformities noted Skin-warm and dry, no hyperpigmentation, vitiligo, or suspicious lesions  Neurological Examination   Mental Status: Alert, oriented, thought content appropriate.  Speech fluent without evidence of aphasia.  Able to follow 3 step commands without difficulty. Cranial Nerves: II: Discs flat bilaterally; Visual fields grossly normal, pupils equal, round, reactive to light and accommodation III,IV, VI: ptosis  not present, extra-ocular motions intact bilaterally V,VII: smile symmetric, facial light touch sensation normal bilaterally VIII: hearing normal bilaterally IX,X: gag reflex present XI: bilateral shoulder shrug XII: midline tongue extension Motor: Right : Upper extremity   3+/5    Left:     Upper extremity   5/5  Lower extremity   4+/5    Lower extremity   5-/5 RLE atrophy noted Sensory: Pinprick and light touch intact throughout, bilaterally Deep Tendon Reflexes: Symmetric throughout Plantars: Right: upgoing   Left: mute Cerebellar: Normal finger-to-nose testing bilaterally Gait: not tested due to safety concerns   Laboratory Studies:   Basic Metabolic Panel: Recent Labs  Lab 11/04/19 0011  NA 139  K 3.5  CL 98  CO2 28  GLUCOSE 118*  BUN 30*  CREATININE 0.72  CALCIUM 9.6    Liver Function  Tests: No results for input(s): AST, ALT, ALKPHOS, BILITOT, PROT, ALBUMIN in the last 168 hours. No results for input(s): LIPASE, AMYLASE in the last 168 hours. No results for input(s): AMMONIA in the last 168 hours.  CBC: Recent Labs  Lab 11/04/19 0011  WBC 6.8  NEUTROABS 4.9  HGB 10.3*  HCT 31.9*  MCV 70.9*  PLT 349    Cardiac Enzymes: No results for input(s): CKTOTAL, CKMB, CKMBINDEX, TROPONINI in the last 168 hours.  BNP: Invalid input(s): POCBNP  CBG: Recent Labs  Lab 11/04/19 2346  GLUCAP 97    Microbiology: No results found for this or any previous visit.  Coagulation Studies: No results for input(s): LABPROT, INR in the last 72 hours.  Urinalysis: No results for input(s): COLORURINE, LABSPEC, PHURINE, GLUCOSEU, HGBUR, BILIRUBINUR, KETONESUR, PROTEINUR, UROBILINOGEN, NITRITE, LEUKOCYTESUR in the last 168 hours.  Invalid input(s): APPERANCEUR  Lipid Panel:     Component Value Date/Time   CHOL 163 11/05/2019 0332   TRIG 380 (H) 11/05/2019 0332   HDL 32 (L) 11/05/2019 0332   CHOLHDL 5.1 11/05/2019 0332   VLDL 76 (H) 11/05/2019 0332   LDLCALC 55 11/05/2019 0332    HgbA1C: No results found for: HGBA1C  Urine Drug Screen:  No results found for: LABOPIA, COCAINSCRNUR, LABBENZ, AMPHETMU, THCU, LABBARB  Alcohol Level:  Recent Labs  Lab 11/04/19 0011  ETH <10    Other results: EKG: sinus rhythm at 89 bpm.  Imaging: Ct Cervical Spine Wo Contrast  Result Date: 11/05/2019 CLINICAL DATA:  68 year old male fell backwards while standing at sink. Weakness since the fall. EXAM: CT CERVICAL SPINE WITHOUT CONTRAST TECHNIQUE: Multidetector CT imaging of the cervical spine was performed without intravenous contrast. Multiplanar CT image reconstructions were also generated. COMPARISON:  Head CT today reported separately. Cervical spine CT 09/14/2018. FINDINGS: Alignment: Stable since October. Levoconvex cervical spine scoliosis with straightening of lordosis. No  spondylolisthesis. Skull base and vertebrae: Visualized skull base is intact. No atlanto-occipital dissociation. Stable C1-C2 alignment. A nondisplaced left C2 posterior element fracture appears healed since 09/14/2018, with fracture line no longer evident on sagittal images (sagittal image 40 today). Previously this was largely occult on axial images. No displacement has occurred. Chronic postoperative changes detailed below. No acute osseous abnormality identified. Soft tissues and spinal canal: No prevertebral fluid or swelling. No visible canal hematoma. Disc levels: Previous C3 through C6 level ACDF. Solid arthrodesis at C3-C4, and C5-C6. But absent arthrodesis at C4-C5, stable. No hardware loosening identified. Adjacent segment disease at C2-C3 and C6-C7 with advanced disc, endplate, and facet degeneration. Upper chest: Visible upper thoracic levels appear stable. Negative lung apices. IMPRESSION: 1. Interval healing  of the nondisplaced left C2 posterior element fracture seen in October. 2. No acute traumatic injury identified in the cervical spine. 3. Previous C3 through C6 ACDF, but chronically absent arthrodesis at C4-C5. No hardware loosening identified. Electronically Signed   By: Genevie Ann M.D.   On: 11/05/2019 00:15   Mr Brain Wo Contrast  Result Date: 11/05/2019 CLINICAL DATA:  68 year old male status post fall backwards with subsequent weakness. EXAM: MRI HEAD WITHOUT CONTRAST TECHNIQUE: Multiplanar, multiecho pulse sequences of the brain and surrounding structures were obtained without intravenous contrast. COMPARISON:  Head CT earlier tonight. Brain MRI 05/12/2009. FINDINGS: Brain: No restricted diffusion or evidence of acute infarction. Chronic enlargement of the left lateral ventricle with associated somewhat congenital appearing volume loss of the left hemisphere (including absent or atrophied left deep gray nuclei). There is bilateral colpocephaly, greater on the left, but the temporal horns  remain diminutive. No acute ventriculomegaly. No transependymal edema suspected. Increased periventricular white matter T2 and FLAIR hyperintensity since 2010, although fairly mild for age. No cortical encephalomalacia or chronic cerebral blood products identified. Chronic simple appearing cystic change to the pineal gland is stable with no regional mass effect. No midline shift, evidence of mass lesion, extra-axial collection or acute intracranial hemorrhage. Cervicomedullary junction and pituitary are within normal limits. Vascular: Major intracranial vascular flow voids are stable since 2010. Skull and upper cervical spine: Visualized bone marrow signal is within normal limits. Sinuses/Orbits: Postoperative changes to the left globe since 2010, otherwise stable orbits. Paranasal sinuses and mastoids are stable and well pneumatized. Other: Visible internal auditory structures appear normal. Scalp and face soft tissues appear negative. IMPRESSION: 1. No acute intracranial abnormality. 2. 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 Electronically Signed   By: Genevie Ann M.D.   On: 11/05/2019 02:18   Mr Lumbar Spine Wo Contrast  Result Date: 11/05/2019 CLINICAL DATA:  68 year old male status post fall backwards with subsequent weakness. EXAM: MRI LUMBAR SPINE WITHOUT CONTRAST TECHNIQUE: Multiplanar, multisequence MR imaging of the lumbar spine was performed. No intravenous contrast was administered. COMPARISON:  Lumbar MRI 05/17/2019. Lumbar spine CT 10/10/2017. FINDINGS: Segmentation: Transitional anatomy. For the purposes of this report hypoplastic or absent ribs are designated at T12 (full size ribs at T11) resulting in the lowest open disc space at L5-S1, and this is the same numbering system used on the June MRI. Correlation with radiographs is recommended prior to any operative  intervention. Alignment: Stable since June. Moderate chronic levoconvex lumbar scoliosis with superimposed mild grade 1 anterolisthesis of L4 on L5. Vertebrae: Chronic T12 compression fracture is stable with mild-to-moderate loss of vertebral height. Superimposed mild left L1 superior endplate compression or large Schmorl's node. Additional chronic endplate changes in the lower lumbar spine. No marrow edema or evidence of acute osseous abnormality. Normal background bone marrow signal. Stable and benign appearing chronic cystic appearing bone lesion in the medial right iliac on series 14, image 36. Intact visible sacrum and SI joints. Conus medullaris and cauda equina: Conus extends to the T12-L1 level. No lower spinal cord or conus signal abnormality. Paraspinal and other soft tissues: Stable since June. Disc levels: L1-L2 with no stable visible lower thoracic spine through spinal stenosis. There is multilevel chronic moderate and severe lower thoracic and upper lumbar neural foraminal stenosis greater on the right. L2-L3: Moderate multifactorial spinal stenosis with severe right lateral recess stenosis is stable related to rightward disc  osteophyte complex and posterior element hypertrophy. Mild left and severe right L2 foraminal stenosis is stable. L3-L4: Moderate to severe multifactorial spinal and right lateral recess stenosis is stable related to circumferential disc osteophyte complex and moderate to severe posterior element hypertrophy. Moderate to severe left and moderate right L3 foraminal stenosis is stable. L4-L5: Moderate to severe chronic spinal and left greater than right lateral recess stenosis appears stable in the setting of grade 1 anterolisthesis. A small subligamentous synovial cyst has developed on the left (series 11, image 27), although does not exacerbate the stenosis at this time. Moderate to severe left and mild right L4 foraminal stenosis is stable. L5-S1: Left side assimilation joint  with left far lateral disc osteophyte complex and moderate posterior element hypertrophy again noted. No spinal or lateral recess stenosis. Moderate to severe left and mild right L5 foraminal stenosis is stable. IMPRESSION: 1. Stable MRI appearance of the lumbar spine since June. 2. Transitional anatomy. Correlation with radiographs is recommended prior to any operative intervention. 3. Chronic levoconvex lumbar scoliosis and grade 1 anterolisthesis at L4-L5 with widespread advanced disc, endplate, and posterior element degeneration. 4. Moderate to severe multifactorial spinal and lateral recess stenosis is stable from L2-L3 through L4-L5. 5. Widespread lower thoracic and lumbar moderate to severe neural foraminal stenosis is stable. Electronically Signed   By: Genevie Ann M.D.   On: 11/05/2019 02:28   Ct Head Code Stroke Wo Contrast  Addendum Date: 11/05/2019   ADDENDUM REPORT: 11/05/2019 00:15 ADDENDUM: Study discussed by telephone with Dr. Marjean Donna on 11/05/2019 at 0010 hours. Electronically Signed   By: Genevie Ann M.D.   On: 11/05/2019 00:15   Result Date: 11/05/2019 CLINICAL DATA:  Code stroke. 68 year old male fell backwards while standing at sink. Weakness since the fall. EXAM: CT HEAD WITHOUT CONTRAST TECHNIQUE: Contiguous axial images were obtained from the base of the skull through the vertex without intravenous contrast. COMPARISON:  Brain MRI 05/12/2009. Head CT 09/14/2018. FINDINGS: Brain: Chronic dilatation of the left lateral ventricle with associated volume loss in the left hemisphere, unchanged since 2010. No transependymal edema. Stable ventricle size and configuration. No midline shift, mass effect, or evidence of intracranial mass lesion. No acute intracranial hemorrhage identified. Stable gray-white matter differentiation throughout the brain. No cortically based acute infarct identified. Mild for age bilateral white matter hypodensity. Vascular: Mild calcified atherosclerosis at the  skull base. No suspicious intracranial vascular hyperdensity. Skull: Stable.  No acute osseous abnormality identified. Sinuses/Orbits: Visualized paranasal sinuses and mastoids are stable and well pneumatized. Other: No scalp hematoma identified. Stable orbits soft tissues. ASPECTS [facility] Stroke Program Early CT Score) Total score (0-10 with 10 being normal): 10 IMPRESSION: 1. No acute cortically based infarct or intracranial hemorrhage identified. ASPECTS 10. 2. Stable non contrast CT appearance of the brain, with chronic enlargement of the left lateral ventricle and associated left hemisphere volume loss. 3.  No acute traumatic injury identified. Electronically Signed: By: Genevie Ann M.D. On: 11/05/2019 00:07     Assessment/Plan: 68 year old male presenting after a fall.  Has a history of stroke, degenerative disc disease and spinal stenosis.  Patient does not report loss of consciousness but does seem to have some LLE weakness and urinary retention that he attributes to sitting in the bed.  MRI of the brain reviewed and shows no acute changes.  MRI of the lumbar spine shows multilevel degenerative disc disease with some associated spinal stenosis.  Although his lumbar spine findings may be the etiology  of his current presentation the patient does not wish to have any evaluation at this time for this and wants to see if things will get better on their own.   LDL 55.    Recommendations: 1. PT/OT evaluation 2. Orthostatic vitals    Alexis Goodell, MD Neurology [phone] 11/05/2019, 10:11 AM

## 2019-11-05 NOTE — Progress Notes (Signed)
Per patient has not urinated all night, pt feels like he needs to urinate but can't. Bladder scanned patient yielded >602 cc. Stood on the side of the bed but unable to urinate. Dr. Mal Misty notified. Received verbal orders for I&O cath. Will continue to monitor.

## 2019-11-05 NOTE — Evaluation (Signed)
Physical Therapy Evaluation Patient Details Name: Anthony Evans MRN: [id number] DOB: February 25, 1951 Today's Date: 11/05/2019   History of Present Illness  From MD note: Pt is a 68 y.o. male with remote history of seizures, stroke, GERD, hypertension, degenerative disc disease, thalassemia, BPH, rheumatoid arthritis, presented to the hospital with a syncopal episode.  He said he was brushing his teeth when he suddenly fell backwards.  He did not lose consciousness.  He was brought to the emergency room for further evaluation.  CT head and MRI brain did not show any evidence of stroke.  EEG was unremarkable.  MD assessement includes: Syncope, Degenerative disc disease lumbar spine/grade 1 anterolisthesis/ lumbar spinal stenosis, Microcytic anemia, remote history of seizures, RA, and BPH.    Clinical Impression  Pt presented with deficits in strength, transfers, mobility, gait, balance, and activity tolerance but performed well overall during the session.  Pt was Mod Ind with bed mobility tasks and demonstrated fair to good eccentric and concentric control during sit to/from stand transfers from various height surfaces.  Pt participated with static and dynamic standing balance training without UE support with fair stability but stability improved with use of RW during amb.  Amb distance limited by nursing entering room for procedure.  Pt will benefit from HHPT services upon discharge to safely address above deficits for decreased caregiver assistance and eventual return to PLOF.      Follow Up Recommendations Home health PT;Supervision - Intermittent    Equipment Recommendations  Rolling walker with 5" wheels    Recommendations for Other Services       Precautions / Restrictions Precautions Precautions: Fall Restrictions Weight Bearing Restrictions: No      Mobility  Bed Mobility Overal bed mobility: Modified Independent             General bed mobility comments: Extra time and  effort but no physical assistance required  Transfers Overall transfer level: Needs assistance Equipment used: Rolling walker (2 wheeled) Transfers: Sit to/from Stand Sit to Stand: Supervision         General transfer comment: Good eccentric and concentric control during sit to/from stand transfers from various height surfaces  Ambulation/Gait Ambulation/Gait assistance: Supervision Gait Distance (Feet): 30 Feet Assistive device: Rolling walker (2 wheeled) Gait Pattern/deviations: Step-through pattern;Decreased step length - right;Decreased step length - left;Trunk flexed Gait velocity: decreased   General Gait Details: Mod verbal cues for amb closer to the RW with upright posture but overall steady without LOB  Stairs            Wheelchair Mobility    Modified Rankin (Stroke Patients Only)       Balance Overall balance assessment: Needs assistance Sitting-balance support: No upper extremity supported Sitting balance-Leahy Scale: Good     Standing balance support: No upper extremity supported;During functional activity Standing balance-Leahy Scale: Fair                               Pertinent Vitals/Pain Pain Assessment: No/denies pain    Home Living Family/patient expects to be discharged to:: Private residence Living Arrangements: Alone Available Help at Discharge: Family;Available PRN/intermittently Type of Home: House Home Access: Ramped entrance     Home Layout: One level Home Equipment: Walker - standard;Grab bars - toilet;Cane - single point;Bedside commode      Prior Function Level of Independence: Independent         Comments: Ind amb limited community distances without AD,  no other falls in the last year, Ind with ADLs     Hand Dominance        Extremity/Trunk Assessment   Upper Extremity Assessment Upper Extremity Assessment: Generalized weakness;RUE deficits/detail RUE Deficits / Details: Chronic RUE weakness from  remote CVA, baseline per pt    Lower Extremity Assessment Lower Extremity Assessment: Generalized weakness;RLE deficits/detail RLE Deficits / Details: Chronic RLE weakness from remote CVA, baseline per pt       Communication   Communication: No difficulties  Cognition Arousal/Alertness: Awake/alert Behavior During Therapy: WFL for tasks assessed/performed Overall Cognitive Status: Within Functional Limits for tasks assessed                                        General Comments      Exercises Other Exercises Other Exercises: Static and dynamic standing balance training without UE support with fair stability   Assessment/Plan    PT Assessment Patient needs continued PT services  PT Problem List Decreased strength;Decreased activity tolerance;Decreased balance;Decreased mobility;Decreased knowledge of use of DME       PT Treatment Interventions DME instruction;Gait training;Functional mobility training;Therapeutic activities;Therapeutic exercise;Balance training;Patient/family education    PT Goals (Current goals can be found in the Care Plan section)  Acute Rehab PT Goals Patient Stated Goal: To get stronger and return home PT Goal Formulation: With patient Time For Goal Achievement: 11/18/19 Potential to Achieve Goals: Good    Frequency Min 2X/week   Barriers to discharge        Co-evaluation               AM-PAC PT "6 Clicks" Mobility  Outcome Measure Help needed turning from your back to your side while in a flat bed without using bedrails?: A Little Help needed moving from lying on your back to sitting on the side of a flat bed without using bedrails?: A Little Help needed moving to and from a bed to a chair (including a wheelchair)?: A Little Help needed standing up from a chair using your arms (e.g., wheelchair or bedside chair)?: A Little Help needed to walk in hospital room?: A Little Help needed climbing 3-5 steps with a railing? :  A Little 6 Click Score: 18    End of Session Equipment Utilized During Treatment: Gait belt Activity Tolerance: Patient tolerated treatment well Patient left: in bed;with nursing/sitter in room(Nursing entered room at end of session for test/procedure) Nurse Communication: Mobility status PT Visit Diagnosis: Difficulty in walking, not elsewhere classified (R26.2);Muscle weakness (generalized) (M62.81)    Time: [id number] PT Time Calculation (min) (ACUTE ONLY): 32 min   Charges:   PT Evaluation $PT Eval Moderate Complexity: 1 Mod PT Treatments $Therapeutic Exercise: 8-22 mins        D. Royetta Asal PT, DPT 11/05/19, 4:31 PM

## 2019-11-05 NOTE — H&P (Signed)
History and Physical    Anthony Evans [id number] DOB: 18-Jun-1951 DOA: 11/04/2019  PCP: Tracie Harrier, MD  Patient coming from: home  I have personally briefly reviewed patient's old medical records in [place]  Chief Complaint: Home  HPI: Anthony Evans is a 67 y.o. 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  Patient states that he was in normal state of health prior to the episode.  States he was brushing his teeth and then all of a sudden fell backwards.  Noted that he did hit his head but did not lose consciousness.  He notes no associated shortness of breath chest pain palpitations. He denies any recent URI illness fever or chills.  He notes he has not had difficulty with p.o. intake and  denies any urinary symptoms or diarrhea. He also denies any dark stools.   ED Course:  In ED patient was evaluated by telemetry neuro noted that patient appeared to be at his baseline but due to his prior history recommended admission for TIA evaluation With initial evaluation with CT scan was noted to be negative  Review of Systems: As per HPI otherwise 10 point review of systems negative.   Past Medical History:  Diagnosis Date   Allergy    Arthritis    psoriatic   Back pain    Cataract    Degenerative disc disease    GERD (gastroesophageal reflux disease)    Hypertension    Reflux    Seizures ([place])    last seizure about 10 years ago   Stroke [facility])    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   Thalassemia    Thyroid disease     Past Surgical History:  Procedure Laterality Date   BACK SURGERY     screws and plate in c4 and c5   CATARACT EXTRACTION W/PHACO Left 05/15/2015   Procedure: CATARACT EXTRACTION PHACO AND INTRAOCULAR LENS PLACEMENT ([place]);  Surgeon: Lyla Glassing, MD;   Location: [facility] ORS;  Service: Ophthalmology;  Laterality: Left;  [place]: 2:40.9 AP: 17.3 CDE: 27.88   CERVICAL FUSION     COLONOSCOPY WITH PROPOFOL N/A 06/14/2016   Procedure: COLONOSCOPY WITH PROPOFOL;  Surgeon: Manya Silvas, MD;  Location: [facility] ENDOSCOPY;  Service: Endoscopy;  Laterality: N/A;   CORRECTION HAMMER TOE     EYE SURGERY Right    cataract   FRACTURE SURGERY     HIP FRACTURE SURGERY Right    pins in hip   JOINT REPLACEMENT     KNEE ARTHROPLASTY Left    total knee replacement     reports that he has never smoked. He has never used smokeless tobacco. He reports that he does not drink alcohol. No history on file for drug.  Allergies  Allergen Reactions   Etanercept Nausea And Vomiting   Etodolac Nausea And Vomiting   Rubbing Alcohol [Isopropyl Alcohol] Rash    Family History  Problem Relation Age of Onset   Cancer Other    Diabetes Other    Arthritis Other     Prior to Admission medications   Medication Sig Start Date End Date Taking? Authorizing Provider  carbamazepine (TEGRETOL) 200 MG tablet Take 400-600 mg by mouth See admin instructions. Take 2 tablets (400MG ) by mouth 3 times daily with meals and 3 tablets (600MG ) by mouth at  bedtime    [provider]  cyclobenzaprine (FLEXERIL) 5 MG tablet Take 5 mg by mouth 3 (three) times daily as needed for muscle spasms.     [provider]  folic acid (FOLVITE) 1 MG tablet Take 1 mg by mouth daily.    [provider]  guaiFENesin-dextromethorphan (ROBITUSSIN DM) 100-10 MG/5ML syrup Take 5 mLs by mouth every 4 (four) hours as needed for cough. Patient not taking: Reported on 09/22/2018 09/15/18   Dustin Flock, MD  hydroxychloroquine (PLAQUENIL) 200 MG tablet Take 200 mg by mouth daily.  02/23/18   [provider]  levothyroxine (SYNTHROID, LEVOTHROID) 175 MCG tablet Take 175 mcg by mouth daily.  04/24/14   [provider]  meloxicam (MOBIC) 7.5 MG tablet Take  7.5 mg by mouth 2 (two) times daily.     [provider]  methotrexate (RHEUMATREX) 2.5 MG tablet Take 10 mg by mouth every Wednesday.  12/26/17   [provider]  primidone (MYSOLINE) 250 MG tablet Take 250 mg by mouth 3 (three) times daily.     [provider]  tamsulosin (FLOMAX) 0.4 MG CAPS capsule Take 1 capsule (0.4 mg total) by mouth daily. 09/15/18   Dustin Flock, MD  timolol (TIMOPTIC) 0.5 % ophthalmic solution Place 1 drop into both eyes 2 (two) times daily.  12/29/17   [provider]  triamterene-hydrochlorothiazide (MAXZIDE) 75-50 MG per tablet Take 1 tablet by mouth daily.     [provider]    Physical Exam: Vitals:   11/04/19 2333 11/05/19 0030 11/05/19 0214 11/05/19 0230  BP:  128/80 (!) 117/97 123/79  Pulse:  85 91 85  Resp:  13 13 14   Temp: 99.3 F (37.4 C)     SpO2:  98% 100% 99%  Weight:      Height:        Constitutional: NAD, calm, comfortable Vitals:   11/04/19 2333 11/05/19 0030 11/05/19 0214 11/05/19 0230  BP:  128/80 (!) 117/97 123/79  Pulse:  85 91 85  Resp:  13 13 14   Temp: 99.3 F (37.4 C)     SpO2:  98% 100% 99%  Weight:      Height:       Eyes: PERRL, lids and conjunctivae normal ENMT: Mucous membranes are moist. Posterior pharynx clear of any exudate or lesions.Normal dentition.  Neck: normal, supple, no masses, no thyromegaly Respiratory: clear to auscultation bilaterally, no wheezing, no crackles. Normal respiratory effort. No accessory muscle use.  Cardiovascular: Regular rate and rhythm, no murmurs / rubs / gallops. No extremity edema. 2+ pedal pulses. No carotid bruits.  Abdomen: no tenderness, no masses palpated. No hepatosplenomegaly. Bowel sounds positive.  Musculoskeletal: no clubbing / cyanosis. No joint deformity upper and lower extremities. Good ROM, no contractures. Normal muscle tone.  Skin: no rashes, lesions, ulcers. No induration Neurologic: CN 2-12 grossly intact. Sensation  intact, DTR normal. Strength 5/5 in R extremities,  4+ /5 in left extremities  Psychiatric: Normal judgment and insight. Alert and oriented x 3. Normal mood.    Labs on Admission: I have personally reviewed following labs and imaging studies  CBC: Recent Labs  Lab 11/04/19 0011  WBC 6.8  NEUTROABS 4.9  HGB 10.3*  HCT 31.9*  MCV 70.9*  PLT 0000000   Basic Metabolic Panel: Recent Labs  Lab 11/04/19 0011  NA 139  K 3.5  CL 98  CO2 28  GLUCOSE 118*  BUN 30*  CREATININE 0.72  CALCIUM 9.6  GFR: Estimated Creatinine Clearance: 102.9 mL/min (by C-G formula based on SCr of 0.72 mg/dL). Liver Function Tests: No results for input(s): AST, ALT, ALKPHOS, BILITOT, PROT, ALBUMIN in the last 168 hours. No results for input(s): LIPASE, AMYLASE in the last 168 hours. No results for input(s): AMMONIA in the last 168 hours. Coagulation Profile: No results for input(s): INR, PROTIME in the last 168 hours. Cardiac Enzymes: No results for input(s): CKTOTAL, CKMB, CKMBINDEX, TROPONINI in the last 168 hours. BNP (last 3 results) No results for input(s): PROBNP in the last 8760 hours. HbA1C: No results for input(s): HGBA1C in the last 72 hours. CBG: Recent Labs  Lab 11/04/19 2346  GLUCAP 97   Lipid Profile: No results for input(s): CHOL, HDL, LDLCALC, TRIG, CHOLHDL, LDLDIRECT in the last 72 hours. Thyroid Function Tests: No results for input(s): TSH, T4TOTAL, FREET4, T3FREE, THYROIDAB in the last 72 hours. Anemia Panel: No results for input(s): VITAMINB12, FOLATE, FERRITIN, TIBC, IRON, RETICCTPCT in the last 72 hours. Urine analysis:    Component Value Date/Time   COLORURINE AMBER (A) 09/14/2018 1219   APPEARANCEUR CLEAR (A) 09/14/2018 1219   LABSPEC 1.024 09/14/2018 1219   PHURINE 5.0 09/14/2018 [place] 09/14/2018 1219   HGBUR NEGATIVE 09/14/2018 [place] 09/14/2018 1219   [place] 09/14/2018 1219   PROTEINUR 30 (A) 09/14/2018 1219     NITRITE NEGATIVE 09/14/2018 1219   LEUKOCYTESUR NEGATIVE 09/14/2018 1219    Radiological Exams on Admission: Ct Cervical Spine Wo Contrast  Result Date: 11/05/2019 CLINICAL DATA:  68 year old male fell backwards while standing at sink. Weakness since the fall. EXAM: CT CERVICAL SPINE WITHOUT CONTRAST TECHNIQUE: Multidetector CT imaging of the cervical spine was performed without intravenous contrast. Multiplanar CT image reconstructions were also generated. COMPARISON:  Head CT today reported separately. Cervical spine CT 09/14/2018. FINDINGS: Alignment: Stable since October. Levoconvex cervical spine scoliosis with straightening of lordosis. No spondylolisthesis. Skull base and vertebrae: Visualized skull base is intact. No atlanto-occipital dissociation. Stable C1-C2 alignment. A nondisplaced left C2 posterior element fracture appears healed since 09/14/2018, with fracture line no longer evident on sagittal images (sagittal image 40 today). Previously this was largely occult on axial images. No displacement has occurred. Chronic postoperative changes detailed below. No acute osseous abnormality identified. Soft tissues and spinal canal: No prevertebral fluid or swelling. No visible canal hematoma. Disc levels: Previous C3 through C6 level ACDF. Solid arthrodesis at C3-C4, and C5-C6. But absent arthrodesis at C4-C5, stable. No hardware loosening identified. Adjacent segment disease at C2-C3 and C6-C7 with advanced disc, endplate, and facet degeneration. Upper chest: Visible upper thoracic levels appear stable. Negative lung apices. IMPRESSION: 1. Interval healing of the nondisplaced left C2 posterior element fracture seen in October. 2. No acute traumatic injury identified in the cervical spine. 3. Previous C3 through C6 ACDF, but chronically absent arthrodesis at C4-C5. No hardware loosening identified. Electronically Signed   By: Genevie Ann M.D.   On: 11/05/2019 00:15   Mr Brain Wo Contrast  Result  Date: 11/05/2019 CLINICAL DATA:  68 year old male status post fall backwards with subsequent weakness. EXAM: MRI HEAD WITHOUT CONTRAST TECHNIQUE: Multiplanar, multiecho pulse sequences of the brain and surrounding structures were obtained without intravenous contrast. COMPARISON:  Head CT earlier tonight. Brain MRI 05/12/2009. FINDINGS: Brain: No restricted diffusion or evidence of acute infarction. Chronic enlargement of the left lateral ventricle with associated somewhat congenital appearing volume loss of the left hemisphere (including absent or atrophied left deep gray nuclei).  There is bilateral colpocephaly, greater on the left, but the temporal horns remain diminutive. No acute ventriculomegaly. No transependymal edema suspected. Increased periventricular white matter T2 and FLAIR hyperintensity since 2010, although fairly mild for age. No cortical encephalomalacia or chronic cerebral blood products identified. Chronic simple appearing cystic change to the pineal gland is stable with no regional mass effect. No midline shift, evidence of mass lesion, extra-axial collection or acute intracranial hemorrhage. Cervicomedullary junction and pituitary are within normal limits. Vascular: Major intracranial vascular flow voids are stable since 2010. Skull and upper cervical spine: Visualized bone marrow signal is within normal limits. Sinuses/Orbits: Postoperative changes to the left globe since 2010, otherwise stable orbits. Paranasal sinuses and mastoids are stable and well pneumatized. Other: Visible internal auditory structures appear normal. Scalp and face soft tissues appear negative. IMPRESSION: 1. No acute intracranial abnormality. 2. 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 Electronically Signed   By: Genevie Ann M.D.   On: 11/05/2019 02:18   Mr Lumbar Spine Wo Contrast  Result  Date: 11/05/2019 CLINICAL DATA:  68 year old male status post fall backwards with subsequent weakness. EXAM: MRI LUMBAR SPINE WITHOUT CONTRAST TECHNIQUE: Multiplanar, multisequence MR imaging of the lumbar spine was performed. No intravenous contrast was administered. COMPARISON:  Lumbar MRI 05/17/2019. Lumbar spine CT 10/10/2017. FINDINGS: Segmentation: Transitional anatomy. For the purposes of this report hypoplastic or absent ribs are designated at T12 (full size ribs at T11) resulting in the lowest open disc space at L5-S1, and this is the same numbering system used on the June MRI. Correlation with radiographs is recommended prior to any operative intervention. Alignment: Stable since June. Moderate chronic levoconvex lumbar scoliosis with superimposed mild grade 1 anterolisthesis of L4 on L5. Vertebrae: Chronic T12 compression fracture is stable with mild-to-moderate loss of vertebral height. Superimposed mild left L1 superior endplate compression or large Schmorl's node. Additional chronic endplate changes in the lower lumbar spine. No marrow edema or evidence of acute osseous abnormality. Normal background bone marrow signal. Stable and benign appearing chronic cystic appearing bone lesion in the medial right iliac on series 14, image 36. Intact visible sacrum and SI joints. Conus medullaris and cauda equina: Conus extends to the T12-L1 level. No lower spinal cord or conus signal abnormality. Paraspinal and other soft tissues: Stable since June. Disc levels: L1-L2 with no stable visible lower thoracic spine through spinal stenosis. There is multilevel chronic moderate and severe lower thoracic and upper lumbar neural foraminal stenosis greater on the right. L2-L3: Moderate multifactorial spinal stenosis with severe right lateral recess stenosis is stable related to rightward disc osteophyte complex and posterior element hypertrophy. Mild left and severe right L2 foraminal stenosis is stable. L3-L4: Moderate  to severe multifactorial spinal and right lateral recess stenosis is stable related to circumferential disc osteophyte complex and moderate to severe posterior element hypertrophy. Moderate to severe left and moderate right L3 foraminal stenosis is stable. L4-L5: Moderate to severe chronic spinal and left greater than right lateral recess stenosis appears stable in the setting of grade 1 anterolisthesis. A small subligamentous synovial cyst has developed on the left (series 11, image 27), although does not exacerbate the stenosis at this time. Moderate to severe left and mild right L4 foraminal stenosis is stable. L5-S1: Left side assimilation joint with left far lateral disc osteophyte complex and moderate posterior element hypertrophy again noted. No spinal or lateral recess stenosis. Moderate to  severe left and mild right L5 foraminal stenosis is stable. IMPRESSION: 1. Stable MRI appearance of the lumbar spine since June. 2. Transitional anatomy. Correlation with radiographs is recommended prior to any operative intervention. 3. Chronic levoconvex lumbar scoliosis and grade 1 anterolisthesis at L4-L5 with widespread advanced disc, endplate, and posterior element degeneration. 4. Moderate to severe multifactorial spinal and lateral recess stenosis is stable from L2-L3 through L4-L5. 5. Widespread lower thoracic and lumbar moderate to severe neural foraminal stenosis is stable. Electronically Signed   By: Genevie Ann M.D.   On: 11/05/2019 02:28   Ct Head Code Stroke Wo Contrast  Addendum Date: 11/05/2019   ADDENDUM REPORT: 11/05/2019 00:15 ADDENDUM: Study discussed by telephone with Dr. Marjean Donna on 11/05/2019 at 0010 hours. Electronically Signed   By: Genevie Ann M.D.   On: 11/05/2019 00:15   Result Date: 11/05/2019 CLINICAL DATA:  Code stroke. 68 year old male fell backwards while standing at sink. Weakness since the fall. EXAM: CT HEAD WITHOUT CONTRAST TECHNIQUE: Contiguous axial images were obtained from  the base of the skull through the vertex without intravenous contrast. COMPARISON:  Brain MRI 05/12/2009. Head CT 09/14/2018. FINDINGS: Brain: Chronic dilatation of the left lateral ventricle with associated volume loss in the left hemisphere, unchanged since 2010. No transependymal edema. Stable ventricle size and configuration. No midline shift, mass effect, or evidence of intracranial mass lesion. No acute intracranial hemorrhage identified. Stable gray-white matter differentiation throughout the brain. No cortically based acute infarct identified. Mild for age bilateral white matter hypodensity. Vascular: Mild calcified atherosclerosis at the skull base. No suspicious intracranial vascular hyperdensity. Skull: Stable.  No acute osseous abnormality identified. Sinuses/Orbits: Visualized paranasal sinuses and mastoids are stable and well pneumatized. Other: No scalp hematoma identified. Stable orbits soft tissues. ASPECTS [facility] Stroke Program Early CT Score) Total score (0-10 with 10 being normal): 10 IMPRESSION: 1. No acute cortically based infarct or intracranial hemorrhage identified. ASPECTS 10. 2. Stable non contrast CT appearance of the brain, with chronic enlargement of the left lateral ventricle and associated left hemisphere volume loss. 3.  No acute traumatic injury identified. Electronically Signed: By: Genevie Ann M.D. On: 11/05/2019 00:07    EKG:  Not available for review, repeat ordered and pending.  Assessment/Plan Active Problems:   Syncope   Near syncope/ TIA/vs medication side-effect: -place on tia/syncope protocol /neuro consult/carotids/ echo pending- asa  -MRI  Pending  - check orthostatic blood pressure -hold possible offending medications    Anemia  -MCV low  -check anemia panel  - monitor hgb   HTN -resume home medication as BP tolerates   Remote history of SZ  No sz in 10-15 years  Resume tegretol   Hypothyroid -continue on synthroid   RA  -not current on  treatment due to side-effect of medication per PCP note   BPH -hold flomax due to recent near syncope  -resume  As able    FEN Replete lytes prn     DVT prophylaxis: Heparin  Code Status:  FULL  Family Communication N/A Disposition Plan:72 hours  Consults calledL neurology  Admission status: inpatient     Clance Boll MD [facility] Pager [phone]  If 7PM-7AM, please contact night-coverage [email or web address] Password [email or web address]  11/05/2019, 3:14 AM

## 2019-11-05 NOTE — ED Provider Notes (Signed)
[facility] Emergency Department Provider Note   First MD Initiated Contact with Patient 11/04/19 2342     (approximate)  I have reviewed the triage vital signs and the nursing notes.   HISTORY  Chief Complaint Weakness    HPI Anthony Evans is a 68 y.o. male with below list of previous medical conditions including in vitro CVA with right-sided baseline deficit presents to the emergency department secondary to near syncopal episode.  Patient states that he was brushing his teeth and then upon standing upright after bending over fell backwards.  Patient denies any loss of consciousness.  Patient does admit to striking his head.        Past Medical History:  Diagnosis Date   Allergy    Arthritis    psoriatic   Back pain    Cataract    Degenerative disc disease    GERD (gastroesophageal reflux disease)    Hypertension    Reflux    Seizures ([place])    last seizure about 10 years ago   Stroke [facility])    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   Thalassemia    Thyroid disease     Patient Active Problem List   Diagnosis Date Noted   Syncope 11/05/2019   C2 cervical fracture ([place]) 09/14/2018   Hypertension 03/08/2018   GERD (gastroesophageal reflux disease) 03/08/2018   Thyroid disease 03/08/2018   Degenerative disc disease 03/08/2018   Psoriatic arthritis ([place]) 03/08/2018   Back pain 03/08/2018   Thalassemia minor 03/08/2018   Seizures ([place]) 03/08/2018   H/O cataract 03/08/2018   Seasonal allergies 03/08/2018    Past Surgical History:  Procedure Laterality Date   BACK SURGERY     screws and plate in c4 and c5   CATARACT EXTRACTION W/PHACO Left 05/15/2015   Procedure: CATARACT EXTRACTION PHACO AND INTRAOCULAR LENS PLACEMENT ([place]);  Surgeon: Lyla Glassing, MD;  Location: [facility] ORS;  Service: Ophthalmology;  Laterality: Left;  [place]: 2:40.9 AP: 17.3 CDE: 27.88   CERVICAL FUSION     COLONOSCOPY WITH PROPOFOL N/A 06/14/2016   Procedure:  COLONOSCOPY WITH PROPOFOL;  Surgeon: Manya Silvas, MD;  Location: [facility] ENDOSCOPY;  Service: Endoscopy;  Laterality: N/A;   CORRECTION HAMMER TOE     EYE SURGERY Right    cataract   FRACTURE SURGERY     HIP FRACTURE SURGERY Right    pins in hip   JOINT REPLACEMENT     KNEE ARTHROPLASTY Left    total knee replacement    Prior to Admission medications   Medication Sig Start Date End Date Taking? Authorizing Provider  Alpha-Lipoic Acid 150 MG CAPS Take 150 mg by mouth daily.   Yes [provider]  Ascorbic Acid (VITAMIN C) 1000 MG tablet Take 1,000 mg by mouth daily.   Yes [provider]  carbamazepine (TEGRETOL) 200 MG tablet Take 400-600 mg by mouth See admin instructions. Take 2 tablets (400MG ) by mouth 3 times daily with meals and 3 tablets (600MG ) by mouth at bedtime   Yes [provider]  Cyanocobalamin (VITAMIN B 12) 100 MCG LOZG Take 100 mcg by mouth daily.   Yes [provider]  cyclobenzaprine (FLEXERIL) 5 MG tablet Take 5 mg by mouth 3 (three) times daily as needed for muscle spasms.    Yes [provider]  folic acid (FOLVITE) 1 MG tablet Take 1 mg by mouth daily.   Yes [provider]  guaiFENesin-dextromethorphan (ROBITUSSIN DM) 100-10 MG/5ML syrup Take 5 mLs  by mouth every 4 (four) hours as needed for cough. 09/15/18  Yes Dustin Flock, MD  hydroxychloroquine (PLAQUENIL) 200 MG tablet Take 200 mg by mouth daily.  02/23/18  Yes [provider]  levothyroxine (SYNTHROID, LEVOTHROID) 175 MCG tablet Take 175 mcg by mouth daily.  04/24/14  Yes [provider]  meloxicam (MOBIC) 7.5 MG tablet Take 7.5 mg by mouth 2 (two) times daily.    Yes [provider]  methotrexate (RHEUMATREX) 2.5 MG tablet Take 10 mg by mouth every Wednesday.  12/26/17  Yes [provider]  Multiple Vitamins-Minerals (CENTRUM SILVER 50+WOMEN) TABS Take 1 tablet by mouth daily.   Yes [provider]    primidone (MYSOLINE) 250 MG tablet Take 250 mg by mouth 3 (three) times daily.    Yes [provider]  tamsulosin (FLOMAX) 0.4 MG CAPS capsule Take 1 capsule (0.4 mg total) by mouth daily. 09/15/18  Yes Dustin Flock, MD  timolol (TIMOPTIC) 0.5 % ophthalmic solution Place 1 drop into both eyes 2 (two) times daily.  12/29/17  Yes [provider]  triamcinolone cream (KENALOG) 0.1 % Apply 1 application topically See admin instructions. 01/21/15  Yes [provider]  triamterene-hydrochlorothiazide (MAXZIDE) 75-50 MG per tablet Take 1 tablet by mouth daily.    Yes [provider]  vitamin E 400 UNIT capsule Take 400 Units by mouth daily.   Yes [provider]    Allergies Etanercept, Etodolac, and Rubbing alcohol [isopropyl alcohol]  Family History  Problem Relation Age of Onset   Cancer Other    Diabetes Other    Arthritis Other     Social History Social History   Tobacco Use   Smoking status: Never Smoker   Smokeless tobacco: Never Used  Substance Use Topics   Alcohol use: No   Drug use: Not on file    Review of Systems Constitutional: No fever/chills Eyes: No visual changes. ENT: No sore throat. Cardiovascular: Denies chest pain. Respiratory: Denies shortness of breath. Gastrointestinal: No abdominal pain.  No nausea, no vomiting.  No diarrhea.  No constipation. Genitourinary: Negative for dysuria. Musculoskeletal: Negative for neck pain.  Negative for back pain. Integumentary: Negative for rash. Neurological: Negative for headaches, focal weakness or numbness.  Positive for syncope   ____________________________________________   PHYSICAL EXAM:  VITAL SIGNS: ED Triage Vitals  Enc Vitals Group     BP 11/04/19 2332 (!) 158/95     Pulse Rate 11/04/19 2332 99     Resp 11/04/19 2332 18     Temp 11/04/19 2333 99.3 F (37.4 C)     Temp src --      SpO2 11/04/19 2332 98 %     Weight 11/04/19 2328 90.7 kg (200 lb)      Height 11/04/19 2328 1.778 m (5\' 10" )     Head Circumference --      Peak Flow --      Pain Score 11/04/19 2328 0     Pain Loc --      Pain Edu? --      Excl. in [place]? --     Constitutional: Alert and oriented.  Eyes: Conjunctivae are normal.  Head: Atraumatic. Mouth/Throat: Patient is wearing a mask. Neck: No stridor.  No meningeal signs.   Cardiovascular: Normal rate, regular rhythm. Good peripheral circulation. Grossly normal heart sounds. Respiratory: Normal respiratory effort.  No retractions. Gastrointestinal: Soft and nontender. No distention.  Musculoskeletal: Decreased right upper extremity and right lower extremity strength in comparison  to the left (baseline per patient) Neurologic:  Normal speech and language. No gross focal neurologic deficits are appreciated.  Skin:  Skin is warm, dry and intact. Psychiatric: Mood and affect are normal. Speech and behavior are normal.  ____________________________________________   LABS (all labs ordered are listed, but only abnormal results are displayed)  Labs Reviewed  BASIC METABOLIC PANEL - Abnormal; Notable for the following components:      Result Value   Glucose, Bld 118 (*)    BUN 30 (*)    All other components within normal limits  CBC WITH DIFFERENTIAL/PLATELET - Abnormal; Notable for the following components:   Hemoglobin 10.3 (*)    HCT 31.9 (*)    MCV 70.9 (*)    MCH 22.9 (*)    RDW 18.6 (*)    All other components within normal limits  LIPID PANEL - Abnormal; Notable for the following components:   Triglycerides 380 (*)    HDL 32 (*)    VLDL 76 (*)    All other components within normal limits  SARS CORONAVIRUS 2 (TAT 6-24 HRS)  GLUCOSE, CAPILLARY  ETHANOL  HIV ANTIBODY (ROUTINE TESTING W REFLEX)  HEMOGLOBIN A1C  RAPID URINE DRUG SCREEN, HOSP PERFORMED  CBG MONITORING, ED   ____________________________________________  EKG  ED ECG REPORT I, Spencer N Ayda Tancredi, the attending physician, personally  viewed and interpreted this ECG.   Date: 11/04/2019  EKG Time: 11:49 PM  Rate: 89  Rhythm: Normal sinus rhythm  Axis: Normal  Intervals: Normal  ST&T Change: None  ____________________________________________  RADIOLOGY I,  N Quatisha Zylka, personally viewed and evaluated these images (plain radiographs) as part of my medical decision making, as well as reviewing the written report by the radiologist.  ED MD interpretation: CT head cervical spine revealed no acute findings.  Official radiology report(s): Ct Cervical Spine Wo Contrast  Result Date: 11/05/2019 CLINICAL DATA:  68 year old male fell backwards while standing at sink. Weakness since the fall. EXAM: CT CERVICAL SPINE WITHOUT CONTRAST TECHNIQUE: Multidetector CT imaging of the cervical spine was performed without intravenous contrast. Multiplanar CT image reconstructions were also generated. COMPARISON:  Head CT today reported separately. Cervical spine CT 09/14/2018. FINDINGS: Alignment: Stable since October. Levoconvex cervical spine scoliosis with straightening of lordosis. No spondylolisthesis. Skull base and vertebrae: Visualized skull base is intact. No atlanto-occipital dissociation. Stable C1-C2 alignment. A nondisplaced left C2 posterior element fracture appears healed since 09/14/2018, with fracture line no longer evident on sagittal images (sagittal image 40 today). Previously this was largely occult on axial images. No displacement has occurred. Chronic postoperative changes detailed below. No acute osseous abnormality identified. Soft tissues and spinal canal: No prevertebral fluid or swelling. No visible canal hematoma. Disc levels: Previous C3 through C6 level ACDF. Solid arthrodesis at C3-C4, and C5-C6. But absent arthrodesis at C4-C5, stable. No hardware loosening identified. Adjacent segment disease at C2-C3 and C6-C7 with advanced disc, endplate, and facet degeneration. Upper chest: Visible upper thoracic levels  appear stable. Negative lung apices. IMPRESSION: 1. Interval healing of the nondisplaced left C2 posterior element fracture seen in October. 2. No acute traumatic injury identified in the cervical spine. 3. Previous C3 through C6 ACDF, but chronically absent arthrodesis at C4-C5. No hardware loosening identified. Electronically Signed   By: Genevie Ann M.D.   On: 11/05/2019 00:15   Mr Brain Wo Contrast  Result Date: 11/05/2019 CLINICAL DATA:  68 year old male status post fall backwards with subsequent weakness. EXAM: MRI HEAD WITHOUT CONTRAST TECHNIQUE: Multiplanar, multiecho  pulse sequences of the brain and surrounding structures were obtained without intravenous contrast. COMPARISON:  Head CT earlier tonight. Brain MRI 05/12/2009. FINDINGS: Brain: No restricted diffusion or evidence of acute infarction. Chronic enlargement of the left lateral ventricle with associated somewhat congenital appearing volume loss of the left hemisphere (including absent or atrophied left deep gray nuclei). There is bilateral colpocephaly, greater on the left, but the temporal horns remain diminutive. No acute ventriculomegaly. No transependymal edema suspected. Increased periventricular white matter T2 and FLAIR hyperintensity since 2010, although fairly mild for age. No cortical encephalomalacia or chronic cerebral blood products identified. Chronic simple appearing cystic change to the pineal gland is stable with no regional mass effect. No midline shift, evidence of mass lesion, extra-axial collection or acute intracranial hemorrhage. Cervicomedullary junction and pituitary are within normal limits. Vascular: Major intracranial vascular flow voids are stable since 2010. Skull and upper cervical spine: Visualized bone marrow signal is within normal limits. Sinuses/Orbits: Postoperative changes to the left globe since 2010, otherwise stable orbits. Paranasal sinuses and mastoids are stable and well pneumatized. Other: Visible  internal auditory structures appear normal. Scalp and face soft tissues appear negative. IMPRESSION: 1. No acute intracranial abnormality. 2. 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 Electronically Signed   By: Genevie Ann M.D.   On: 11/05/2019 02:18   Mr Lumbar Spine Wo Contrast  Result Date: 11/05/2019 CLINICAL DATA:  68 year old male status post fall backwards with subsequent weakness. EXAM: MRI LUMBAR SPINE WITHOUT CONTRAST TECHNIQUE: Multiplanar, multisequence MR imaging of the lumbar spine was performed. No intravenous contrast was administered. COMPARISON:  Lumbar MRI 05/17/2019. Lumbar spine CT 10/10/2017. FINDINGS: Segmentation: Transitional anatomy. For the purposes of this report hypoplastic or absent ribs are designated at T12 (full size ribs at T11) resulting in the lowest open disc space at L5-S1, and this is the same numbering system used on the June MRI. Correlation with radiographs is recommended prior to any operative intervention. Alignment: Stable since June. Moderate chronic levoconvex lumbar scoliosis with superimposed mild grade 1 anterolisthesis of L4 on L5. Vertebrae: Chronic T12 compression fracture is stable with mild-to-moderate loss of vertebral height. Superimposed mild left L1 superior endplate compression or large Schmorl's node. Additional chronic endplate changes in the lower lumbar spine. No marrow edema or evidence of acute osseous abnormality. Normal background bone marrow signal. Stable and benign appearing chronic cystic appearing bone lesion in the medial right iliac on series 14, image 36. Intact visible sacrum and SI joints. Conus medullaris and cauda equina: Conus extends to the T12-L1 level. No lower spinal cord or conus signal abnormality. Paraspinal and other soft tissues: Stable since June. Disc levels: L1-L2 with no stable visible lower  thoracic spine through spinal stenosis. There is multilevel chronic moderate and severe lower thoracic and upper lumbar neural foraminal stenosis greater on the right. L2-L3: Moderate multifactorial spinal stenosis with severe right lateral recess stenosis is stable related to rightward disc osteophyte complex and posterior element hypertrophy. Mild left and severe right L2 foraminal stenosis is stable. L3-L4: Moderate to severe multifactorial spinal and right lateral recess stenosis is stable related to circumferential disc osteophyte complex and moderate to severe posterior element hypertrophy. Moderate to severe left and moderate right L3 foraminal stenosis is stable. L4-L5: Moderate to severe chronic spinal and left greater than right lateral recess stenosis appears stable in the setting of grade 1 anterolisthesis. A small subligamentous synovial  cyst has developed on the left (series 11, image 27), although does not exacerbate the stenosis at this time. Moderate to severe left and mild right L4 foraminal stenosis is stable. L5-S1: Left side assimilation joint with left far lateral disc osteophyte complex and moderate posterior element hypertrophy again noted. No spinal or lateral recess stenosis. Moderate to severe left and mild right L5 foraminal stenosis is stable. IMPRESSION: 1. Stable MRI appearance of the lumbar spine since June. 2. Transitional anatomy. Correlation with radiographs is recommended prior to any operative intervention. 3. Chronic levoconvex lumbar scoliosis and grade 1 anterolisthesis at L4-L5 with widespread advanced disc, endplate, and posterior element degeneration. 4. Moderate to severe multifactorial spinal and lateral recess stenosis is stable from L2-L3 through L4-L5. 5. Widespread lower thoracic and lumbar moderate to severe neural foraminal stenosis is stable. Electronically Signed   By: Genevie Ann M.D.   On: 11/05/2019 02:28   Ct Head Code Stroke Wo Contrast  Addendum Date:  11/05/2019   ADDENDUM REPORT: 11/05/2019 00:15 ADDENDUM: Study discussed by telephone with Dr. Marjean Donna on 11/05/2019 at 0010 hours. Electronically Signed   By: Genevie Ann M.D.   On: 11/05/2019 00:15   Result Date: 11/05/2019 CLINICAL DATA:  Code stroke. 68 year old male fell backwards while standing at sink. Weakness since the fall. EXAM: CT HEAD WITHOUT CONTRAST TECHNIQUE: Contiguous axial images were obtained from the base of the skull through the vertex without intravenous contrast. COMPARISON:  Brain MRI 05/12/2009. Head CT 09/14/2018. FINDINGS: Brain: Chronic dilatation of the left lateral ventricle with associated volume loss in the left hemisphere, unchanged since 2010. No transependymal edema. Stable ventricle size and configuration. No midline shift, mass effect, or evidence of intracranial mass lesion. No acute intracranial hemorrhage identified. Stable gray-white matter differentiation throughout the brain. No cortically based acute infarct identified. Mild for age bilateral white matter hypodensity. Vascular: Mild calcified atherosclerosis at the skull base. No suspicious intracranial vascular hyperdensity. Skull: Stable.  No acute osseous abnormality identified. Sinuses/Orbits: Visualized paranasal sinuses and mastoids are stable and well pneumatized. Other: No scalp hematoma identified. Stable orbits soft tissues. ASPECTS [facility] Stroke Program Early CT Score) Total score (0-10 with 10 being normal): 10 IMPRESSION: 1. No acute cortically based infarct or intracranial hemorrhage identified. ASPECTS 10. 2. Stable non contrast CT appearance of the brain, with chronic enlargement of the left lateral ventricle and associated left hemisphere volume loss. 3.  No acute traumatic injury identified. Electronically Signed: By: Genevie Ann M.D. On: 11/05/2019 00:07     Procedures   ____________________________________________   INITIAL IMPRESSION / MDM / ASSESSMENT AND PLAN / ED COURSE  As part of  my medical decision making, I reviewed the following data within the [place] NUMBER   68 year old male presented with above-stated history and physical exam secondary to near syncope/syncope with concern for possible CVA versus cardiac etiology.  Laboratory data unremarkable CT head revealed no acute findings.  Stroke protocol was initiated.  Patient with an NIH stroke scale of 3.  Patient evaluated by telemetry neurology who recommended MRI no TPA agreed with NIH stroke scale of 3.  Recommendation for admission and as such patient discussed with hospitalist after hospital admission for further evaluation and management.  ____________________________________________  FINAL CLINICAL IMPRESSION(S) / ED DIAGNOSES  Final diagnoses:  TIA (transient ischemic attack)     MEDICATIONS GIVEN DURING THIS VISIT:  Medications   stroke: mapping our early stages of recovery book (has no administration in time range)  0.9 %  sodium chloride infusion ( Intravenous Transfusing/Transfer 11/05/19 0526)  acetaminophen (TYLENOL) tablet 650 mg (has no administration in time range)    Or  acetaminophen (TYLENOL) 160 MG/5ML solution 650 mg (has no administration in time range)    Or  acetaminophen (TYLENOL) suppository 650 mg (has no administration in time range)  senna-docusate (Senokot-S) tablet 1 tablet (has no administration in time range)  heparin injection 5,000 Units (has no administration in time range)  levothyroxine (SYNTHROID) tablet 175 mcg (has no administration in time range)  pneumococcal 23 valent vaccine (PNEUMOVAX-23) injection 0.5 mL (has no administration in time range)     ED Discharge Orders    None      *Please note:  UILLIAM KAZMER was evaluated in Emergency Department on 11/05/2019 for the symptoms described in the history of present illness. He was evaluated in the context of the global COVID-19 pandemic, which necessitated consideration that the patient might be at  risk for infection with the SARS-CoV-2 virus that causes COVID-19. Institutional protocols and algorithms that pertain to the evaluation of patients at risk for COVID-19 are in a state of rapid change based on information released by regulatory bodies including the CDC and federal and state organizations. These policies and algorithms were followed during the patient's care in the ED.  Some ED evaluations and interventions may be delayed as a result of limited staffing during the pandemic.*  Note:  This document was prepared using Dragon voice recognition software and may include unintentional dictation errors.   Gregor Hams, MD 11/05/19 [id number]

## 2019-11-05 NOTE — Plan of Care (Signed)
  Problem: Education: Goal: Knowledge of General Education information will improve Description: Including pain rating scale, medication(s)/side effects and non-pharmacologic comfort measures Outcome: Progressing   Problem: Education: Goal: Knowledge of secondary prevention will improve Outcome: Progressing Goal: Knowledge of patient specific risk factors addressed and post discharge goals established will improve Outcome: Progressing

## 2019-11-05 NOTE — ED Notes (Signed)
Pt back from MRI. Placed back on monitor at this time.

## 2019-11-05 NOTE — Progress Notes (Signed)
Progress Note    Anthony Evans  [id number] DOB: Apr 16, 1951  DOA: 11/04/2019 PCP: Tracie Harrier, MD      Brief Narrative:    Medical records reviewed and are as summarized below:  Anthony Evans is an 68 y.o. male with remote history of seizures, stroke, GERD, hypertension, degenerative disc disease, thalassemia, BPH, rheumatoid arthritis, Presented to the hospital with a syncopal episode.  He said he was brushing his teeth when he suddenly fell backwards.  He did not lose consciousness.  He was brought to the emergency room for further evaluation.  CT head and MRI brain did not show any evidence of stroke.  EEG was unremarkable.  Carotid duplex only showed 1 to 49% stenosis bilaterally.     Assessment/Plan:   Active Problems:   Syncope   Body mass index is 28.7 kg/m.   Syncope: Check orthostatic vital signs.  MRI brain did not show any evidence of acute stroke.  Carotid duplex showed 1 to 49% stenosis bilaterally.  2D echo is pending.  EEG was unremarkable.  Patient was evaluated by the neurologist today.  Discontinue IV fluids.  Degenerative disc disease lumbar spine/grade 1 anterolisthesis/ lumbar spinal stenosis: Outpatient follow-up with neurosurgeon was recommended.  Dyslipidemia/hypertriglyceridemia: Triglycerides of 380, LDL 55 and HDL 32.  Low-fat diet recommended.  Microcytic anemia: Check iron panel and B12 level  Seizure disorder with remote history of seizures: Continue Tegretol  Rheumatoid arthritis: Continue hydroxychloroquine  BPH with acute urinary retention: We will try to avoid Foley catheter for now.  Continue to monitor.  Flomax on hold    Family Communication/Anticipated D/C date and plan/Code Status   DVT prophylaxis: Heparin Code Status: Full code Family Communication: Plan discussed with the patient Disposition Plan: Possible discharge to home tomorrow      Subjective:   He had trouble passing urine today and in and out  urethral catheterization was performed because of urinary retention.  Greater than 602 cc of urine was removed from the bladder.  He has no other complaints.  No dizziness, shortness of breath, chest pain, unilateral weakness or numbness in the extremities.  She said her grandmother had degenerative disc disease and lumbar stenosis so he is not surprised he has that, and he has also learned that nobody has ever recovered from back surgery    Objective:    Vitals:   11/05/19 0539 11/05/19 0751 11/05/19 1258 11/05/19 1530  BP: 125/73 137/86 136/85   Pulse: 91 82 91   Resp: 17 18 18 18   Temp: 98.7 F (37.1 C) 98.2 F (36.8 C)  98.6 F (37 C)  TempSrc: Oral Oral    SpO2: 96% 98% 97% 98%  Weight:      Height:        Intake/Output Summary (Last 24 hours) at 11/05/2019 1605 Last data filed at 11/05/2019 1300 Gross per 24 hour  Intake 480 ml  Output 1500 ml  Net -1020 ml   Filed Weights   11/04/19 2328  Weight: 90.7 kg    Exam:  GEN: NAD SKIN: No rash EYES: EOMI, no pallor or icterus, PERRLA ENT: MMM CV: RRR PULM: CTA B ABD: soft, ND, NT, +BS CNS: AAO x 3, non focal EXT: No edema or tenderness   Data Reviewed:   I have personally reviewed following labs and imaging studies:  Labs: Labs show the following:   Basic Metabolic Panel: Recent Labs  Lab 11/04/19 0011  NA 139  K 3.5  CL 98  CO2 28  GLUCOSE 118*  BUN 30*  CREATININE 0.72  CALCIUM 9.6   GFR Estimated Creatinine Clearance: 102.9 mL/min (by C-G formula based on SCr of 0.72 mg/dL). Liver Function Tests: No results for input(s): AST, ALT, ALKPHOS, BILITOT, PROT, ALBUMIN in the last 168 hours. No results for input(s): LIPASE, AMYLASE in the last 168 hours. No results for input(s): AMMONIA in the last 168 hours. Coagulation profile No results for input(s): INR, PROTIME in the last 168 hours.  CBC: Recent Labs  Lab 11/04/19 0011  WBC 6.8  NEUTROABS 4.9  HGB 10.3*  HCT 31.9*  MCV 70.9*  PLT  349   Cardiac Enzymes: No results for input(s): CKTOTAL, CKMB, CKMBINDEX, TROPONINI in the last 168 hours. BNP (last 3 results) No results for input(s): PROBNP in the last 8760 hours. CBG: Recent Labs  Lab 11/04/19 2346  GLUCAP 97   D-Dimer: No results for input(s): DDIMER in the last 72 hours. Hgb A1c: No results for input(s): HGBA1C in the last 72 hours. Lipid Profile: Recent Labs    11/05/19 0332  CHOL 163  HDL 32*  LDLCALC 55  TRIG 380*  CHOLHDL 5.1   Thyroid function studies: Recent Labs    11/05/19 0635  TSH 0.909   Anemia work up: No results for input(s): VITAMINB12, FOLATE, FERRITIN, TIBC, IRON, RETICCTPCT in the last 72 hours. Sepsis Labs: Recent Labs  Lab 11/04/19 0011  WBC 6.8    Microbiology Recent Results (from the past 240 hour(s))  SARS CORONAVIRUS 2 (TAT 6-24 HRS) Nasopharyngeal Nasopharyngeal Swab     Status: None   Collection Time: 11/05/19  2:42 AM   Specimen: Nasopharyngeal Swab  Result Value Ref Range Status   SARS Coronavirus 2 NEGATIVE NEGATIVE Final    Comment: (NOTE) SARS-CoV-2 target nucleic acids are NOT DETECTED. The SARS-CoV-2 RNA is generally detectable in upper and lower respiratory specimens during the acute phase of infection. Negative results do not preclude SARS-CoV-2 infection, do not rule out co-infections with other pathogens, and should not be used as the sole basis for treatment or other patient management decisions. Negative results must be combined with clinical observations, patient history, and epidemiological information. The expected result is Negative. Fact Sheet for Patients: SugarRoll.be Fact Sheet for Healthcare Providers: https://www.woods-mathews.com/ This test is not yet approved or cleared by the [place] FDA and  has been authorized for detection and/or diagnosis of SARS-CoV-2 by FDA under an Emergency Use Authorization (EUA). This EUA will remain  in  effect (meaning this test can be used) for the duration of the COVID-19 declaration under Section 56 4(b)(1) of the Act, 21 U.S.C. section 360bbb-3(b)(1), unless the authorization is terminated or revoked sooner. Performed at [facility], [place] [address], [place], [place] [address]     Procedures and diagnostic studies:  Ct Cervical Spine Wo Contrast  Result Date: 11/05/2019 CLINICAL DATA:  68 year old male fell backwards while standing at sink. Weakness since the fall. EXAM: CT CERVICAL SPINE WITHOUT CONTRAST TECHNIQUE: Multidetector CT imaging of the cervical spine was performed without intravenous contrast. Multiplanar CT image reconstructions were also generated. COMPARISON:  Head CT today reported separately. Cervical spine CT 09/14/2018. FINDINGS: Alignment: Stable since October. Levoconvex cervical spine scoliosis with straightening of lordosis. No spondylolisthesis. Skull base and vertebrae: Visualized skull base is intact. No atlanto-occipital dissociation. Stable C1-C2 alignment. A nondisplaced left C2 posterior element fracture appears healed since 09/14/2018, with fracture line no longer evident on sagittal images (sagittal image  40 today). Previously this was largely occult on axial images. No displacement has occurred. Chronic postoperative changes detailed below. No acute osseous abnormality identified. Soft tissues and spinal canal: No prevertebral fluid or swelling. No visible canal hematoma. Disc levels: Previous C3 through C6 level ACDF. Solid arthrodesis at C3-C4, and C5-C6. But absent arthrodesis at C4-C5, stable. No hardware loosening identified. Adjacent segment disease at C2-C3 and C6-C7 with advanced disc, endplate, and facet degeneration. Upper chest: Visible upper thoracic levels appear stable. Negative lung apices. IMPRESSION: 1. Interval healing of the nondisplaced left C2 posterior element fracture seen in October. 2. No acute traumatic injury identified in the  cervical spine. 3. Previous C3 through C6 ACDF, but chronically absent arthrodesis at C4-C5. No hardware loosening identified. Electronically Signed   By: Genevie Ann M.D.   On: 11/05/2019 00:15   Mr Brain Wo Contrast  Result Date: 11/05/2019 CLINICAL DATA:  67 year old male status post fall backwards with subsequent weakness. EXAM: MRI HEAD WITHOUT CONTRAST TECHNIQUE: Multiplanar, multiecho pulse sequences of the brain and surrounding structures were obtained without intravenous contrast. COMPARISON:  Head CT earlier tonight. Brain MRI 05/12/2009. FINDINGS: Brain: No restricted diffusion or evidence of acute infarction. Chronic enlargement of the left lateral ventricle with associated somewhat congenital appearing volume loss of the left hemisphere (including absent or atrophied left deep gray nuclei). There is bilateral colpocephaly, greater on the left, but the temporal horns remain diminutive. No acute ventriculomegaly. No transependymal edema suspected. Increased periventricular white matter T2 and FLAIR hyperintensity since 2010, although fairly mild for age. No cortical encephalomalacia or chronic cerebral blood products identified. Chronic simple appearing cystic change to the pineal gland is stable with no regional mass effect. No midline shift, evidence of mass lesion, extra-axial collection or acute intracranial hemorrhage. Cervicomedullary junction and pituitary are within normal limits. Vascular: Major intracranial vascular flow voids are stable since 2010. Skull and upper cervical spine: Visualized bone marrow signal is within normal limits. Sinuses/Orbits: Postoperative changes to the left globe since 2010, otherwise stable orbits. Paranasal sinuses and mastoids are stable and well pneumatized. Other: Visible internal auditory structures appear normal. Scalp and face soft tissues appear negative. IMPRESSION: 1. No acute intracranial abnormality. 2. 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 Electronically Signed   By: Genevie Ann M.D.   On: 11/05/2019 02:18   Mr Lumbar Spine Wo Contrast  Result Date: 11/05/2019 CLINICAL DATA:  68 year old male status post fall backwards with subsequent weakness. EXAM: MRI LUMBAR SPINE WITHOUT CONTRAST TECHNIQUE: Multiplanar, multisequence MR imaging of the lumbar spine was performed. No intravenous contrast was administered. COMPARISON:  Lumbar MRI 05/17/2019. Lumbar spine CT 10/10/2017. FINDINGS: Segmentation: Transitional anatomy. For the purposes of this report hypoplastic or absent ribs are designated at T12 (full size ribs at T11) resulting in the lowest open disc space at L5-S1, and this is the same numbering system used on the June MRI. Correlation with radiographs is recommended prior to any operative intervention. Alignment: Stable since June. Moderate chronic levoconvex lumbar scoliosis with superimposed mild grade 1 anterolisthesis of L4 on L5. Vertebrae: Chronic T12 compression fracture is stable with mild-to-moderate loss of vertebral height. Superimposed mild left L1 superior endplate compression or large Schmorl's node. Additional chronic endplate changes in the lower lumbar spine. No marrow edema or evidence of acute osseous abnormality. Normal background bone marrow signal. Stable and benign appearing chronic cystic appearing bone lesion in  the medial right iliac on series 14, image 36. Intact visible sacrum and SI joints. Conus medullaris and cauda equina: Conus extends to the T12-L1 level. No lower spinal cord or conus signal abnormality. Paraspinal and other soft tissues: Stable since June. Disc levels: L1-L2 with no stable visible lower thoracic spine through spinal stenosis. There is multilevel chronic moderate and severe lower thoracic and upper lumbar neural foraminal stenosis greater on the right. L2-L3: Moderate  multifactorial spinal stenosis with severe right lateral recess stenosis is stable related to rightward disc osteophyte complex and posterior element hypertrophy. Mild left and severe right L2 foraminal stenosis is stable. L3-L4: Moderate to severe multifactorial spinal and right lateral recess stenosis is stable related to circumferential disc osteophyte complex and moderate to severe posterior element hypertrophy. Moderate to severe left and moderate right L3 foraminal stenosis is stable. L4-L5: Moderate to severe chronic spinal and left greater than right lateral recess stenosis appears stable in the setting of grade 1 anterolisthesis. A small subligamentous synovial cyst has developed on the left (series 11, image 27), although does not exacerbate the stenosis at this time. Moderate to severe left and mild right L4 foraminal stenosis is stable. L5-S1: Left side assimilation joint with left far lateral disc osteophyte complex and moderate posterior element hypertrophy again noted. No spinal or lateral recess stenosis. Moderate to severe left and mild right L5 foraminal stenosis is stable. IMPRESSION: 1. Stable MRI appearance of the lumbar spine since June. 2. Transitional anatomy. Correlation with radiographs is recommended prior to any operative intervention. 3. Chronic levoconvex lumbar scoliosis and grade 1 anterolisthesis at L4-L5 with widespread advanced disc, endplate, and posterior element degeneration. 4. Moderate to severe multifactorial spinal and lateral recess stenosis is stable from L2-L3 through L4-L5. 5. Widespread lower thoracic and lumbar moderate to severe neural foraminal stenosis is stable. Electronically Signed   By: Genevie Ann M.D.   On: 11/05/2019 02:28   US Carotid Bilateral (at [facility] Only)  Result Date: 11/05/2019 CLINICAL DATA:  68 year old male with symptoms of transient ischemic attack EXAM: BILATERAL CAROTID DUPLEX ULTRASOUND TECHNIQUE: Pearline Cables scale imaging, color Doppler and  duplex ultrasound were performed of bilateral carotid and vertebral arteries in the neck. COMPARISON:  Prior carotid duplex ultrasound 12/25/2008 FINDINGS: Criteria: Quantification of carotid stenosis is based on velocity parameters that correlate the residual internal carotid diameter with NASCET-based stenosis levels, using the diameter of the distal internal carotid lumen as the denominator for stenosis measurement. The following velocity measurements were obtained: RIGHT ICA: 91/29 cm/sec CCA: [id number] cm/sec SYSTOLIC ICA/CCA RATIO:  1.3 ECA:  81 cm/sec LEFT ICA: 83/32 cm/sec CCA: [id number] cm/sec SYSTOLIC ICA/CCA RATIO:  1.0 ECA:  47 cm/sec RIGHT CAROTID ARTERY: Mild heterogeneous atherosclerotic plaque in the proximal internal carotid artery. By peak systolic velocity criteria, the estimated stenosis is less than 50%. RIGHT VERTEBRAL ARTERY:  Patent with normal antegrade flow. LEFT CAROTID ARTERY: Very mild heterogeneous atherosclerotic plaque in the proximal internal carotid artery. By peak systolic velocity criteria, the estimated stenosis remains less than 50%. LEFT VERTEBRAL ARTERY:  Patent with normal antegrade flow. IMPRESSION: 1. Mild (1-49%) stenosis proximal right internal carotid artery secondary to trace heterogeneous atherosclerotic plaque. 2. Mild (1-49%) stenosis proximal left internal carotid artery secondary to trace heterogeneous atherosclerotic plaque. 3. The vertebral arteries are patent with normal antegrade flow. Signed, Criselda Peaches, MD, [facility] Electronically Signed   By: Jacqulynn Cadet M.D.   On: 11/05/2019 10:36  Ct Head Code Stroke Wo Contrast  Addendum Date: 11/05/2019   ADDENDUM REPORT: 11/05/2019 00:15 ADDENDUM: Study discussed by telephone with Dr. Marjean Donna on 11/05/2019 at 0010 hours. Electronically Signed   By: Genevie Ann M.D.   On: 11/05/2019 00:15   Result Date: 11/05/2019 CLINICAL DATA:  Code  stroke. 68 year old male fell backwards while standing at sink. Weakness since the fall. EXAM: CT HEAD WITHOUT CONTRAST TECHNIQUE: Contiguous axial images were obtained from the base of the skull through the vertex without intravenous contrast. COMPARISON:  Brain MRI 05/12/2009. Head CT 09/14/2018. FINDINGS: Brain: Chronic dilatation of the left lateral ventricle with associated volume loss in the left hemisphere, unchanged since 2010. No transependymal edema. Stable ventricle size and configuration. No midline shift, mass effect, or evidence of intracranial mass lesion. No acute intracranial hemorrhage identified. Stable gray-white matter differentiation throughout the brain. No cortically based acute infarct identified. Mild for age bilateral white matter hypodensity. Vascular: Mild calcified atherosclerosis at the skull base. No suspicious intracranial vascular hyperdensity. Skull: Stable.  No acute osseous abnormality identified. Sinuses/Orbits: Visualized paranasal sinuses and mastoids are stable and well pneumatized. Other: No scalp hematoma identified. Stable orbits soft tissues. ASPECTS [facility] Stroke Program Early CT Score) Total score (0-10 with 10 being normal): 10 IMPRESSION: 1. No acute cortically based infarct or intracranial hemorrhage identified. ASPECTS 10. 2. Stable non contrast CT appearance of the brain, with chronic enlargement of the left lateral ventricle and associated left hemisphere volume loss. 3.  No acute traumatic injury identified. Electronically Signed: By: Genevie Ann M.D. On: 11/05/2019 00:07    Medications:     stroke: mapping our early stages of recovery book   Does not apply Once   carbamazepine  400 mg Oral TID WC   carbamazepine  600 mg Oral QHS   folic acid  1 mg Oral Daily   heparin  5,000 Units Subcutaneous Q8H   hydroxychloroquine  200 mg Oral Daily   levothyroxine  175 mcg Oral Q0600   meloxicam  7.5 mg Oral BID   [START ON 11/07/2019] methotrexate  10 mg  Oral Q Wed   multivitamin with minerals  1 tablet Oral Daily   [START ON 11/06/2019] pneumococcal 23 valent vaccine  0.5 mL Intramuscular Tomorrow-1000   primidone  250 mg Oral TID   tamsulosin  0.4 mg Oral Daily   vitamin B-12  100 mcg Oral Daily   vitamin C  1,000 mg Oral Daily   Continuous Infusions:  sodium chloride 75 mL/hr at 11/05/19 0451     LOS: 0 days   Jacorion Klem  [facility]   *Please refer to [email or web address], password [email or web address] to get updated schedule on who will round on this patient, as hospitalists switch teams weekly. If 7PM-7AM, please contact night-coverage at [email or web address], password [email or web address] for any overnight needs.  11/05/2019, 4:05 PM

## 2019-11-05 NOTE — Progress Notes (Signed)
PHARMACIST - PHYSICIAN ORDER COMMUNICATION  CONCERNING: P&T Medication Policy on Herbal Medications  DESCRIPTION:  This patient's order for:  Alpha-Lipoic Acid  has been noted.  This product(s) is classified as an "herbal" or natural product. Due to a lack of definitive safety studies or FDA approval, nonstandard manufacturing practices, plus the potential risk of unknown drug-drug interactions while on inpatient medications, the Pharmacy and Therapeutics Committee does not permit the use of "herbal" or natural products of this type within [facility].   ACTION TAKEN: The pharmacy department is unable to verify this order at this time. Please reevaluate patient's clinical condition at discharge and address if the herbal or natural product(s) should be resumed at that time.  Paulina Fusi, PharmD, BCPS 11/05/2019 9:15 AM

## 2019-11-05 NOTE — Progress Notes (Signed)
Chart reviewed. ST visited Pt. Pt is able to communicate needs and wants without difficulty. No apparent receptive deficits through ST screen. Pt reports no swallowing difficulty. NO ST eval indicated at this time. Please reconsult if further concerns.

## 2019-11-05 NOTE — ED Notes (Signed)
Patient transported to CT escorted by Wells Guiles, RN.

## 2019-11-06 LAB — CARBAMAZEPINE, FREE AND TOTAL
Carbamazepine, Free: 1.5 ug/mL (ref 0.6–4.2)
Carbamazepine, Total: 1.5 ug/mL — ABNORMAL LOW (ref 4.0–12.0)

## 2019-11-06 LAB — HEMOGLOBIN A1C
Hgb A1c MFr Bld: 4.8 % (ref 4.8–5.6)
Mean Plasma Glucose: 91 mg/dL

## 2019-11-06 MED ORDER — TIMOLOL MALEATE 0.5 % OP SOLN
1.0000 [drp] | Freq: Two times a day (BID) | OPHTHALMIC | Status: DC
  Administered 2019-11-06: 1 [drp] via OPHTHALMIC
  Filled 2019-11-06: qty 5

## 2019-11-06 NOTE — Progress Notes (Signed)
Discharge instructions provided to patient. No questions at this time. Patient will be picked up by sister to be driven home.

## 2019-11-06 NOTE — Evaluation (Signed)
Occupational Therapy Evaluation Patient Details Name: Anthony Evans MRN: [id number] DOB: 12/25/1950 Today's Date: 11/06/2019    History of Present Illness From MD note: Pt is a 68 y.o. male with remote history of seizures, stroke, GERD, hypertension, degenerative disc disease, thalassemia, BPH, rheumatoid arthritis, presented to the hospital with a syncopal episode.  He said he was brushing his teeth when he suddenly fell backwards.  He did not lose consciousness.  He was brought to the emergency room for further evaluation.  CT head and MRI brain did not show any evidence of stroke.  EEG was unremarkable.  MD assessement includes: Syncope, Degenerative disc disease lumbar spine/grade 1 anterolisthesis/ lumbar spinal stenosis, Microcytic anemia, remote history of seizures, RA, and BPH.   Clinical Impression   Pt seen for OT evaluation this date. Prior to hospital admission, pt was Indep with ADLs.  Pt lives alone in [facility] with ramped entrance.  Currently pt demonstrates very minimal fxl activity tolerance deficits requiring supv for standing ADLs and fxl mobility.  Pt would benefit from skilled OT to address noted impairments and functional limitations (see below for any additional details) in order to maximize safety and independence while minimizing falls risk and caregiver burden. Upon hospital discharge, recommend pt discharge to home with intermittent supervision or assist from family for assist with higher level IADLs.    Follow Up Recommendations  Supervision - Intermittent    Equipment Recommendations       Recommendations for Other Services       Precautions / Restrictions Precautions Precautions: Fall Restrictions Weight Bearing Restrictions: No      Mobility Bed Mobility Overal bed mobility: Modified Independent                Transfers Overall transfer level: Needs assistance Equipment used: Rolling walker (2 wheeled) Transfers: Sit to/from Stand Sit to Stand:  Supervision              Balance Overall balance assessment: Needs assistance Sitting-balance support: No upper extremity supported Sitting balance-Leahy Scale: Good     Standing balance support: No upper extremity supported;During functional activity Standing balance-Leahy Scale: Fair                             ADL either performed or assessed with clinical judgement   ADL Overall ADL's : At baseline                                       General ADL Comments: OT provides supv-SBA with all standing ADLs including LB dressing. Pt demos G dynamic sitting balance to perform all LB ADLs with setup. Pt seated EOB, participates in UB/LB dressing with setup. Sit<>stand for LB bathing and peri care with supv. Pt with G static standing balance, uses 1 UE support on sink intermittently during grooming tasks. Pt with F balance with fxl mobility w/o AD stating he does not use at home. However, upgrades to G balance with fxl mobility with AD.     Vision Patient Visual Report: No change from baseline       Perception     Praxis      Pertinent Vitals/Pain Pain Assessment: No/denies pain     Hand Dominance     Extremity/Trunk Assessment Upper Extremity Assessment Upper Extremity Assessment: Generalized weakness;RUE deficits/detail RUE Deficits / Details: Chronic RUE weakness and decreased  coordination from remote CVA-pt states this is his baseline. Somewhat exacerbated by arthritis as well per pt. RUE Coordination: decreased fine motor;decreased gross motor   Lower Extremity Assessment Lower Extremity Assessment: Defer to PT evaluation;RLE deficits/detail RLE Deficits / Details: baseline RLE weakness per pt 2/2 remote hx of CVA       Communication Communication Communication: No difficulties   Cognition Arousal/Alertness: Awake/alert Behavior During Therapy: WFL for tasks assessed/performed Overall Cognitive Status: Within Functional Limits for  tasks assessed                                     General Comments       Exercises Other Exercises Other Exercises: OT Facilitates general fall prevention education with pt for both home and acute environment. Pt verbalized understanding.   Shoulder Instructions      Home Living Family/patient expects to be discharged to:: Private residence Living Arrangements: Alone Available Help at Discharge: Family;Available PRN/intermittently Type of Home: House Home Access: Ramped entrance     Home Layout: One level         Bathroom Toilet: Handicapped height     Home Equipment: Walker - standard;Grab bars - toilet;Cane - single point;Bedside commode          Prior Functioning/Environment Level of Independence: Independent        Comments: Ind amb limited community distances without AD (has RW, but state he does not use), no other falls in the last year, Ind with ADLs        OT Problem List: Decreased strength;Decreased range of motion;Decreased activity tolerance;Decreased coordination      OT Treatment/Interventions:      OT Goals(Current goals can be found in the care plan section) Acute Rehab OT Goals Patient Stated Goal: To get stronger and return home OT Goal Formulation: All assessment and education complete, DC therapy  OT Frequency:     Barriers to D/C:            Co-evaluation              AM-PAC OT "6 Clicks" Daily Activity     Outcome Measure Help from another person eating meals?: None Help from another person taking care of personal grooming?: None Help from another person toileting, which includes using toliet, bedpan, or urinal?: None Help from another person bathing (including washing, rinsing, drying)?: A Little Help from another person to put on and taking off regular upper body clothing?: None Help from another person to put on and taking off regular lower body clothing?: None 6 Click Score: 23   End of Session  Equipment Utilized During Treatment: Gait belt;Rolling walker Nurse Communication: Mobility status(notified RN that pt is bathed, dressed, has ordered lunch, and is sitting up in chair.)  Activity Tolerance: Patient tolerated treatment well Patient left: in chair;with call bell/phone within reach  OT Visit Diagnosis: Unsteadiness on feet (R26.81)                Time: [id number] OT Time Calculation (min): 57 min Charges:  OT General Charges $OT Visit: 1 Visit OT Evaluation $OT Eval Low Complexity: 1 Low OT Treatments $Self Care/Home Management : 23-37 mins $Therapeutic Activity: 8-22 mins  Gerrianne Scale, MS, OTR/L ascom [phone] 11/06/19, 1:18 PM

## 2019-11-06 NOTE — Discharge Summary (Addendum)
Physician Discharge Summary  Anthony Evans [id number] DOB: November 28, 1951 DOA: 11/04/2019  PCP: Tracie Harrier, MD  Admit date: 11/04/2019 Discharge date: 11/06/2019  Discharge disposition: Home   Recommendations for Outpatient Follow-Up:   Outpatient follow-up with neurosurgeon recommended.   Discharge Diagnosis:   Active Problems:   Syncope    Discharge Condition: Stable.  Diet recommendation: Low-salt diet  Code status: Full code.    Hospital Course:    Anthony Evans is a 68 year old man with history of stroke, hypertension, lumbar degenerative disc disease, BPH, remote history of seizures, who presented to the hospital after syncopal event at home that occurred while he was brushing his teeth.  He said there was no loss of consciousness.  He was admitted to telemetry for monitoring.  CT head and MRI brain did not show any acute stroke.  There was no evidence of orthostatic hypotension.  However, he was found to have lumbar degenerative disc disease, grade 1 anterolisthesis of L4 on L5 and lumbar spinal stenosis on MRI lumbar spine.  G, carotid ultrasound and 2D echo were unremarkable.  He had an episode of acute urinary retention while on admission but this has resolved.  He was evaluated by PT who recommended home health physical therapy and a rolling walker at discharge.  Incidentally, patient had a reactive HIV test.  However, confirmatory test is pending at the time of discharge.  All test results were discussed with patient and he verbalized understanding.  He was advised to follow-up with neurosurgeon for further management of lumbar spinal disease.   ADDENDUM on 11/09/2019 at 10:17 am:  I called Anthony Evans on his home phone on 11/09/2019 at 10:12 am to discuss his HIV test results. HIV 2 was negative but HIV 1 was indeterminate. He was advised to follow up with his PCP to repeat the HIV test because his recent test was indeterminate. He verbalized  understanding.   Medical Consultants:    Neurologist, Dr. Alexis Goodell   Discharge Exam:   Vitals:   11/06/19 0355 11/06/19 0801  BP: (!) 148/98 136/83  Pulse: 88 75  Resp:  19  Temp: (!) 97.5 F (36.4 C) 97.9 F (36.6 C)  SpO2: 97% 100%   Vitals:   11/05/19 1530 11/05/19 2053 11/06/19 0355 11/06/19 0801  BP:  (!) 148/89 (!) 148/98 136/83  Pulse:  95 88 75  Resp: 18   19  Temp: 98.6 F (37 C) 98.4 F (36.9 C) (!) 97.5 F (36.4 C) 97.9 F (36.6 C)  TempSrc:  Oral Oral   SpO2: 98% 99% 97% 100%  Weight:   92.3 kg   Height:         GEN: NAD SKIN: No rash EYES: EOMI ENT: MMM CV: RRR PULM: CTA B ABD: soft, ND, NT, +BS CNS: AAO x 3, non focal EXT: No edema or tenderness   The results of significant diagnostics from this hospitalization (including imaging, microbiology, ancillary and laboratory) are listed below for reference.     Procedures and Diagnostic Studies:   Ct Cervical Spine Wo Contrast  Result Date: 11/05/2019 CLINICAL DATA:  68 year old male fell backwards while standing at sink. Weakness since the fall. EXAM: CT CERVICAL SPINE WITHOUT CONTRAST TECHNIQUE: Multidetector CT imaging of the cervical spine was performed without intravenous contrast. Multiplanar CT image reconstructions were also generated. COMPARISON:  Head CT today reported separately. Cervical spine CT 09/14/2018. FINDINGS: Alignment: Stable since October. Levoconvex cervical spine scoliosis with straightening of lordosis. No spondylolisthesis. Skull base  and vertebrae: Visualized skull base is intact. No atlanto-occipital dissociation. Stable C1-C2 alignment. A nondisplaced left C2 posterior element fracture appears healed since 09/14/2018, with fracture line no longer evident on sagittal images (sagittal image 40 today). Previously this was largely occult on axial images. No displacement has occurred. Chronic postoperative changes detailed below. No acute osseous abnormality identified.  Soft tissues and spinal canal: No prevertebral fluid or swelling. No visible canal hematoma. Disc levels: Previous C3 through C6 level ACDF. Solid arthrodesis at C3-C4, and C5-C6. But absent arthrodesis at C4-C5, stable. No hardware loosening identified. Adjacent segment disease at C2-C3 and C6-C7 with advanced disc, endplate, and facet degeneration. Upper chest: Visible upper thoracic levels appear stable. Negative lung apices. IMPRESSION: 1. Interval healing of the nondisplaced left C2 posterior element fracture seen in October. 2. No acute traumatic injury identified in the cervical spine. 3. Previous C3 through C6 ACDF, but chronically absent arthrodesis at C4-C5. No hardware loosening identified. Electronically Signed   By: Genevie Ann M.D.   On: 11/05/2019 00:15   Mr Brain Wo Contrast  Result Date: 11/05/2019 CLINICAL DATA:  68 year old male status post fall backwards with subsequent weakness. EXAM: MRI HEAD WITHOUT CONTRAST TECHNIQUE: Multiplanar, multiecho pulse sequences of the brain and surrounding structures were obtained without intravenous contrast. COMPARISON:  Head CT earlier tonight. Brain MRI 05/12/2009. FINDINGS: Brain: No restricted diffusion or evidence of acute infarction. Chronic enlargement of the left lateral ventricle with associated somewhat congenital appearing volume loss of the left hemisphere (including absent or atrophied left deep gray nuclei). There is bilateral colpocephaly, greater on the left, but the temporal horns remain diminutive. No acute ventriculomegaly. No transependymal edema suspected. Increased periventricular white matter T2 and FLAIR hyperintensity since 2010, although fairly mild for age. No cortical encephalomalacia or chronic cerebral blood products identified. Chronic simple appearing cystic change to the pineal gland is stable with no regional mass effect. No midline shift, evidence of mass lesion, extra-axial collection or acute intracranial hemorrhage.  Cervicomedullary junction and pituitary are within normal limits. Vascular: Major intracranial vascular flow voids are stable since 2010. Skull and upper cervical spine: Visualized bone marrow signal is within normal limits. Sinuses/Orbits: Postoperative changes to the left globe since 2010, otherwise stable orbits. Paranasal sinuses and mastoids are stable and well pneumatized. Other: Visible internal auditory structures appear normal. Scalp and face soft tissues appear negative. IMPRESSION: 1. No acute intracranial abnormality. 2. 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 Electronically Signed   By: Genevie Ann M.D.   On: 11/05/2019 02:18   Mr Lumbar Spine Wo Contrast  Result Date: 11/05/2019 CLINICAL DATA:  68 year old male status post fall backwards with subsequent weakness. EXAM: MRI LUMBAR SPINE WITHOUT CONTRAST TECHNIQUE: Multiplanar, multisequence MR imaging of the lumbar spine was performed. No intravenous contrast was administered. COMPARISON:  Lumbar MRI 05/17/2019. Lumbar spine CT 10/10/2017. FINDINGS: Segmentation: Transitional anatomy. For the purposes of this report hypoplastic or absent ribs are designated at T12 (full size ribs at T11) resulting in the lowest open disc space at L5-S1, and this is the same numbering system used on the June MRI. Correlation with radiographs is recommended prior to any operative intervention. Alignment: Stable since June. Moderate chronic levoconvex lumbar scoliosis with superimposed mild grade 1 anterolisthesis of L4 on L5. Vertebrae: Chronic T12 compression fracture is stable with mild-to-moderate loss of vertebral height. Superimposed mild left L1 superior endplate compression or large  Schmorl's node. Additional chronic endplate changes in the lower lumbar spine. No marrow edema or evidence of acute osseous abnormality. Normal background  bone marrow signal. Stable and benign appearing chronic cystic appearing bone lesion in the medial right iliac on series 14, image 36. Intact visible sacrum and SI joints. Conus medullaris and cauda equina: Conus extends to the T12-L1 level. No lower spinal cord or conus signal abnormality. Paraspinal and other soft tissues: Stable since June. Disc levels: L1-L2 with no stable visible lower thoracic spine through spinal stenosis. There is multilevel chronic moderate and severe lower thoracic and upper lumbar neural foraminal stenosis greater on the right. L2-L3: Moderate multifactorial spinal stenosis with severe right lateral recess stenosis is stable related to rightward disc osteophyte complex and posterior element hypertrophy. Mild left and severe right L2 foraminal stenosis is stable. L3-L4: Moderate to severe multifactorial spinal and right lateral recess stenosis is stable related to circumferential disc osteophyte complex and moderate to severe posterior element hypertrophy. Moderate to severe left and moderate right L3 foraminal stenosis is stable. L4-L5: Moderate to severe chronic spinal and left greater than right lateral recess stenosis appears stable in the setting of grade 1 anterolisthesis. A small subligamentous synovial cyst has developed on the left (series 11, image 27), although does not exacerbate the stenosis at this time. Moderate to severe left and mild right L4 foraminal stenosis is stable. L5-S1: Left side assimilation joint with left far lateral disc osteophyte complex and moderate posterior element hypertrophy again noted. No spinal or lateral recess stenosis. Moderate to severe left and mild right L5 foraminal stenosis is stable. IMPRESSION: 1. Stable MRI appearance of the lumbar spine since June. 2. Transitional anatomy. Correlation with radiographs is recommended prior to any operative intervention. 3. Chronic levoconvex lumbar scoliosis and grade 1 anterolisthesis at L4-L5 with  widespread advanced disc, endplate, and posterior element degeneration. 4. Moderate to severe multifactorial spinal and lateral recess stenosis is stable from L2-L3 through L4-L5. 5. Widespread lower thoracic and lumbar moderate to severe neural foraminal stenosis is stable. Electronically Signed   By: Genevie Ann M.D.   On: 11/05/2019 02:28   US Carotid Bilateral (at [facility] Only)  Result Date: 11/05/2019 CLINICAL DATA:  68 year old male with symptoms of transient ischemic attack EXAM: BILATERAL CAROTID DUPLEX ULTRASOUND TECHNIQUE: Pearline Cables scale imaging, color Doppler and duplex ultrasound were performed of bilateral carotid and vertebral arteries in the neck. COMPARISON:  Prior carotid duplex ultrasound 12/25/2008 FINDINGS: Criteria: Quantification of carotid stenosis is based on velocity parameters that correlate the residual internal carotid diameter with NASCET-based stenosis levels, using the diameter of the distal internal carotid lumen as the denominator for stenosis measurement. The following velocity measurements were obtained: RIGHT ICA: 91/29 cm/sec CCA: [id number] cm/sec SYSTOLIC ICA/CCA RATIO:  1.3 ECA:  81 cm/sec LEFT ICA: 83/32 cm/sec CCA: [id number] cm/sec SYSTOLIC ICA/CCA RATIO:  1.0 ECA:  47 cm/sec RIGHT CAROTID ARTERY: Mild heterogeneous atherosclerotic plaque in the proximal internal carotid artery. By peak systolic velocity criteria, the estimated stenosis is less than 50%. RIGHT VERTEBRAL ARTERY:  Patent with normal antegrade flow. LEFT CAROTID ARTERY: Very mild heterogeneous atherosclerotic plaque in the proximal internal carotid artery. By peak systolic velocity criteria, the estimated stenosis remains less than 50%. LEFT VERTEBRAL ARTERY:  Patent with normal antegrade flow. IMPRESSION: 1. Mild (1-49%) stenosis proximal right internal carotid artery secondary to trace heterogeneous atherosclerotic plaque. 2. Mild (1-49%) stenosis proximal left internal carotid artery secondary to trace heterogeneous  atherosclerotic plaque. 3. The  vertebral arteries are patent with normal antegrade flow. Signed, Criselda Peaches, MD, [facility] Electronically Signed   By: Jacqulynn Cadet M.D.   On: 11/05/2019 10:36   Ct Head Code Stroke Wo Contrast  Addendum Date: 11/05/2019   ADDENDUM REPORT: 11/05/2019 00:15 ADDENDUM: Study discussed by telephone with Dr. Marjean Donna on 11/05/2019 at 0010 hours. Electronically Signed   By: Genevie Ann M.D.   On: 11/05/2019 00:15   Result Date: 11/05/2019 CLINICAL DATA:  Code stroke. 68 year old male fell backwards while standing at sink. Weakness since the fall. EXAM: CT HEAD WITHOUT CONTRAST TECHNIQUE: Contiguous axial images were obtained from the base of the skull through the vertex without intravenous contrast. COMPARISON:  Brain MRI 05/12/2009. Head CT 09/14/2018. FINDINGS: Brain: Chronic dilatation of the left lateral ventricle with associated volume loss in the left hemisphere, unchanged since 2010. No transependymal edema. Stable ventricle size and configuration. No midline shift, mass effect, or evidence of intracranial mass lesion. No acute intracranial hemorrhage identified. Stable gray-white matter differentiation throughout the brain. No cortically based acute infarct identified. Mild for age bilateral white matter hypodensity. Vascular: Mild calcified atherosclerosis at the skull base. No suspicious intracranial vascular hyperdensity. Skull: Stable.  No acute osseous abnormality identified. Sinuses/Orbits: Visualized paranasal sinuses and mastoids are stable and well pneumatized. Other: No scalp hematoma identified. Stable orbits soft tissues. ASPECTS [facility] Stroke Program Early CT Score) Total score (0-10 with 10 being normal): 10 IMPRESSION: 1. No acute cortically based infarct or intracranial hemorrhage identified. ASPECTS 10. 2. Stable non contrast CT appearance of the brain, with chronic  enlargement of the left lateral ventricle and associated left hemisphere volume loss. 3.  No acute traumatic injury identified. Electronically Signed: By: Genevie Ann M.D. On: 11/05/2019 00:07     Labs:   Basic Metabolic Panel: Recent Labs  Lab 11/04/19 0011  NA 139  K 3.5  CL 98  CO2 28  GLUCOSE 118*  BUN 30*  CREATININE 0.72  CALCIUM 9.6   GFR Estimated Creatinine Clearance: 103.7 mL/min (by C-G formula based on SCr of 0.72 mg/dL). Liver Function Tests: No results for input(s): AST, ALT, ALKPHOS, BILITOT, PROT, ALBUMIN in the last 168 hours. No results for input(s): LIPASE, AMYLASE in the last 168 hours. No results for input(s): AMMONIA in the last 168 hours. Coagulation profile No results for input(s): INR, PROTIME in the last 168 hours.  CBC: Recent Labs  Lab 11/04/19 0011  WBC 6.8  NEUTROABS 4.9  HGB 10.3*  HCT 31.9*  MCV 70.9*  PLT 349   Cardiac Enzymes: No results for input(s): CKTOTAL, CKMB, CKMBINDEX, TROPONINI in the last 168 hours. BNP: Invalid input(s): POCBNP CBG: Recent Labs  Lab 11/04/19 2346 11/05/19 2108  GLUCAP 97 123*   D-Dimer No results for input(s): DDIMER in the last 72 hours. Hgb A1c No results for input(s): HGBA1C in the last 72 hours. Lipid Profile Recent Labs    11/05/19 0332  CHOL 163  HDL 32*  LDLCALC 55  TRIG 380*  CHOLHDL 5.1   Thyroid function studies Recent Labs    11/05/19 0635  TSH 0.909   Anemia work up Recent Labs    11/05/19 1633 11/05/19 1635  VITAMINB12  --  544  FERRITIN 67  --   TIBC 230*  --   IRON 50  --    Microbiology Recent Results (from the past 240 hour(s))  SARS CORONAVIRUS 2 (TAT 6-24 HRS) Nasopharyngeal Nasopharyngeal Swab  Status: None   Collection Time: 11/05/19  2:42 AM   Specimen: Nasopharyngeal Swab  Result Value Ref Range Status   SARS Coronavirus 2 NEGATIVE NEGATIVE Final    Comment: (NOTE) SARS-CoV-2 target nucleic acids are NOT DETECTED. The SARS-CoV-2 RNA is generally  detectable in upper and lower respiratory specimens during the acute phase of infection. Negative results do not preclude SARS-CoV-2 infection, do not rule out co-infections with other pathogens, and should not be used as the sole basis for treatment or other patient management decisions. Negative results must be combined with clinical observations, patient history, and epidemiological information. The expected result is Negative. Fact Sheet for Patients: SugarRoll.be Fact Sheet for Healthcare Providers: https://www.woods-mathews.com/ This test is not yet approved or cleared by the [place] FDA and  has been authorized for detection and/or diagnosis of SARS-CoV-2 by FDA under an Emergency Use Authorization (EUA). This EUA will remain  in effect (meaning this test can be used) for the duration of the COVID-19 declaration under Section 56 4(b)(1) of the Act, 21 U.S.C. section 360bbb-3(b)(1), unless the authorization is terminated or revoked sooner. Performed at [facility], [place] [address] [place], [place] [address]      Discharge Instructions:   Discharge Instructions    Diet - low sodium heart healthy   Complete by: As directed    Face-to-face encounter (required for Medicare/Medicaid patients)   Complete by: As directed    I Neely Cecena certify that this patient is under my care and that I, or a nurse practitioner or physician's assistant working with me, had a face-to-face encounter that meets the physician face-to-face encounter requirements with this patient on 11/06/2019. The encounter with the patient was in whole, or in part for the following medical condition(s) which is the primary reason for home health care (List medical condition): Degenerative disc disease, lumbar spinal stenosis   The encounter with the patient was in whole, or in part, for the following medical condition, which is the primary reason for home health  care: Degenerative disc disease, lumbar spinal stenosis   I certify that, based on my findings, the following services are medically necessary home health services: Physical therapy   Reason for Medically Necessary Home Health Services:  Therapy- Personnel officer, Public librarian Therapy- Instruction on use of Assistive Device for Ambulation on all Surfaces     My clinical findings support the need for the above services: Unable to leave home safely without assistance and/or assistive device   Further, I certify that my clinical findings support that this patient is homebound due to: Unsafe ambulation due to balance issues   Home Health   Complete by: As directed    To provide the following care/treatments: PT   Increase activity slowly   Complete by: As directed      Allergies as of 11/06/2019      Reactions   Etanercept Nausea And Vomiting   Etodolac Nausea And Vomiting   Rubbing Alcohol [isopropyl Alcohol] Rash      Medication List    STOP taking these medications   Alpha-Lipoic Acid 150 MG Caps   guaiFENesin-dextromethorphan 100-10 MG/5ML syrup Commonly known as: ROBITUSSIN DM     TAKE these medications   carbamazepine 200 MG tablet Commonly known as: TEGRETOL Take 400-600 mg by mouth See admin instructions. Take 2 tablets (400MG ) by mouth 3 times daily with meals and 3 tablets (600MG ) by mouth at bedtime   Centrum Silver 50+Women Tabs Take  1 tablet by mouth daily.   cyclobenzaprine 5 MG tablet Commonly known as: FLEXERIL Take 5 mg by mouth 3 (three) times daily as needed for muscle spasms.   folic acid 1 MG tablet Commonly known as: FOLVITE Take 1 mg by mouth daily.   hydroxychloroquine 200 MG tablet Commonly known as: PLAQUENIL Take 200 mg by mouth daily.   levothyroxine 175 MCG tablet Commonly known as: SYNTHROID Take 175 mcg by mouth daily.   meloxicam 7.5 MG tablet Commonly known as: MOBIC Take 7.5 mg by mouth 2 (two) times daily.     methotrexate 2.5 MG tablet Commonly known as: RHEUMATREX Take 10 mg by mouth every Wednesday.   primidone 250 MG tablet Commonly known as: MYSOLINE Take 250 mg by mouth 3 (three) times daily.   tamsulosin 0.4 MG Caps capsule Commonly known as: FLOMAX Take 1 capsule (0.4 mg total) by mouth daily.   timolol 0.5 % ophthalmic solution Commonly known as: TIMOPTIC Place 1 drop into both eyes 2 (two) times daily.   triamcinolone cream 0.1 % Commonly known as: KENALOG Apply 1 application topically See admin instructions.   triamterene-hydrochlorothiazide 75-50 MG tablet Commonly known as: MAXZIDE Take 1 tablet by mouth daily.   Vitamin B 12 100 MCG Lozg Take 100 mcg by mouth daily.   vitamin C 1000 MG tablet Take 1,000 mg by mouth daily.   vitamin E 400 UNIT capsule Take 400 Units by mouth daily.         Time coordinating discharge: 26 minutes  Signed:  Elliona Doddridge  [facility] 11/06/2019, 11:24 AM

## 2019-11-06 NOTE — TOC Transition Note (Signed)
Transition of Care [facility]) - CM/SW Discharge Note   Patient Details  Name: IANMICHAEL ORGEL MRN: [id number] Date of Birth: March 14, 1951  [facility] CM/SW Contact:  Ross Ludwig, LCSW Phone Number: 11/06/2019, 6:53 PM   Clinical Narrative:    Patient to discharge home with home health PT through [facility].  CSW spoke to [facility] at [facility] and they will accept patient.   Final next level of care: [place] Barriers to Discharge: Barriers Resolved   Patient Goals and CMS Choice Patient states their goals for this hospitalization and ongoing recovery are:: To return back home with home health. CMS Medicare.gov Compare Post Acute Care list provided to:: Patient Choice offered to / list presented to : Patient  Discharge Placement  Discharging back home with home health.                     Discharge Plan and [place] no equipment needed.            DME Arranged: N/A         HH Arranged: PT HH Agency: [place] ([facility]) Date HH Agency Contacted: 11/06/19 Time [place]: 1430 Representative spoke with at [place]: [place] ([place]) Interventions     Readmission Risk Interventions No flowsheet data found.

## 2019-11-07 LAB — PRIMIDONE, SERUM
Phenobarbital: 38 ug/mL (ref 15–40)
Primidone, Serum: 5.1 ug/mL — ABNORMAL LOW (ref 5.0–12.0)

## 2019-11-07 LAB — PANEL 083904
HIV 1 AB: UNDETERMINED — AB
HIV 2 AB: NEGATIVE

## 2019-11-08 DIAGNOSIS — R29898 Other symptoms and signs involving the musculoskeletal system: Secondary | ICD-10-CM | POA: Insufficient documentation

## 2019-11-08 DIAGNOSIS — E781 Pure hyperglyceridemia: Secondary | ICD-10-CM | POA: Insufficient documentation

## 2019-11-08 DIAGNOSIS — D649 Anemia, unspecified: Secondary | ICD-10-CM | POA: Insufficient documentation

## 2019-11-08 DIAGNOSIS — Z6830 Body mass index (BMI) 30.0-30.9, adult: Secondary | ICD-10-CM | POA: Insufficient documentation

## 2019-11-24 ENCOUNTER — Encounter: Payer: Self-pay | Admitting: *Deleted

## 2019-11-24 ENCOUNTER — Emergency Department
Admission: EM | Admit: 2019-11-24 | Discharge: 2019-11-24 | Disposition: A | Discharge status: Home / Self Care | Payer: Medicare Other | Attending: Emergency Medicine | Admitting: Emergency Medicine

## 2019-11-24 ENCOUNTER — Other Ambulatory Visit: Payer: Self-pay

## 2019-11-24 DIAGNOSIS — R531 Weakness: Secondary | ICD-10-CM | POA: Insufficient documentation

## 2019-11-24 DIAGNOSIS — Z5321 Procedure and treatment not carried out due to patient leaving prior to being seen by health care provider: Secondary | ICD-10-CM | POA: Insufficient documentation

## 2019-11-24 DIAGNOSIS — R42 Dizziness and giddiness: Secondary | ICD-10-CM | POA: Insufficient documentation

## 2019-11-24 NOTE — ED Notes (Signed)
Patient has not returned to waiting room

## 2019-11-24 NOTE — ED Notes (Signed)
Patient observed walking out of waiting room.

## 2019-11-24 NOTE — ED Notes (Signed)
Called for room, not in [place]. Assume LWBS

## 2019-11-24 NOTE — ED Triage Notes (Signed)
Pt presents via EMS w/ c/o feeling weak and off balance. Pt states he had to use a walker to ambulate from bedroom to kitchen. Pt states he fell a few weeks ago and has had the walker to help him with his balance since. Pt c/o feeling weak, too weak to get out of a chair and use the walker to get back to bed. Pt is very oriented to his usual routine and this routine was upset tonight.

## 2019-11-24 NOTE — ED Notes (Signed)
Called for room, not in [place].

## 2019-11-24 NOTE — ED Notes (Signed)
Called for room, not in [place].

## 2019-12-04 ENCOUNTER — Ambulatory Visit: Payer: Medicare Other | Attending: Internal Medicine

## 2019-12-04 DIAGNOSIS — Z20822 Contact with and (suspected) exposure to covid-19: Secondary | ICD-10-CM

## 2019-12-06 ENCOUNTER — Telehealth: Payer: Self-pay

## 2019-12-06 LAB — NOVEL CORONAVIRUS, NAA: SARS-CoV-2, NAA: NOT DETECTED

## 2019-12-06 NOTE — Telephone Encounter (Signed)
Caller given negative result and verbalized understanding  

## 2019-12-28 ENCOUNTER — Other Ambulatory Visit: Payer: Self-pay

## 2019-12-28 ENCOUNTER — Emergency Department: Payer: Medicare Other

## 2019-12-28 ENCOUNTER — Emergency Department
Admission: EM | Admit: 2019-12-28 | Discharge: 2019-12-28 | Disposition: A | Discharge status: Home / Self Care | Payer: Medicare Other | Attending: Emergency Medicine | Admitting: Emergency Medicine

## 2019-12-28 ENCOUNTER — Encounter: Payer: Self-pay | Admitting: Emergency Medicine

## 2019-12-28 DIAGNOSIS — Z20822 Contact with and (suspected) exposure to covid-19: Secondary | ICD-10-CM | POA: Diagnosis not present

## 2019-12-28 DIAGNOSIS — Z8673 Personal history of transient ischemic attack (TIA), and cerebral infarction without residual deficits: Secondary | ICD-10-CM | POA: Insufficient documentation

## 2019-12-28 DIAGNOSIS — R0602 Shortness of breath: Secondary | ICD-10-CM | POA: Diagnosis present

## 2019-12-28 DIAGNOSIS — Z96652 Presence of left artificial knee joint: Secondary | ICD-10-CM | POA: Diagnosis not present

## 2019-12-28 DIAGNOSIS — I10 Essential (primary) hypertension: Secondary | ICD-10-CM | POA: Insufficient documentation

## 2019-12-28 DIAGNOSIS — Z79899 Other long term (current) drug therapy: Secondary | ICD-10-CM | POA: Diagnosis not present

## 2019-12-28 LAB — CBC WITH DIFFERENTIAL/PLATELET
Abs Immature Granulocytes: 0.02 10*3/uL (ref 0.00–0.07)
Basophils Absolute: 0 10*3/uL (ref 0.0–0.1)
Basophils Relative: 1 %
Eosinophils Absolute: 0.1 10*3/uL (ref 0.0–0.5)
Eosinophils Relative: 1 %
HCT: 32.6 % — ABNORMAL LOW (ref 39.0–52.0)
Hemoglobin: 10.4 g/dL — ABNORMAL LOW (ref 13.0–17.0)
Immature Granulocytes: 0 %
Lymphocytes Relative: 14 %
Lymphs Abs: 0.9 10*3/uL (ref 0.7–4.0)
MCH: 23.2 pg — ABNORMAL LOW (ref 26.0–34.0)
MCHC: 31.9 g/dL (ref 30.0–36.0)
MCV: 72.6 fL — ABNORMAL LOW (ref 80.0–100.0)
Monocytes Absolute: 0.8 10*3/uL (ref 0.1–1.0)
Monocytes Relative: 13 %
Neutro Abs: 4.5 10*3/uL (ref 1.7–7.7)
Neutrophils Relative %: 71 %
Platelets: 316 10*3/uL (ref 150–400)
RBC: 4.49 MIL/uL (ref 4.22–5.81)
RDW: 18.9 % — ABNORMAL HIGH (ref 11.5–15.5)
WBC: 6.3 10*3/uL (ref 4.0–10.5)
nRBC: 0.3 % — ABNORMAL HIGH (ref 0.0–0.2)

## 2019-12-28 LAB — BASIC METABOLIC PANEL
Anion gap: 11 (ref 5–15)
BUN: 23 mg/dL (ref 8–23)
CO2: 29 mmol/L (ref 22–32)
Calcium: 9.6 mg/dL (ref 8.9–10.3)
Chloride: 91 mmol/L — ABNORMAL LOW (ref 98–111)
Creatinine, Ser: 0.54 mg/dL — ABNORMAL LOW (ref 0.61–1.24)
GFR calc Af Amer: >60 mL/min (ref >60)
GFR calc non Af Amer: >60 mL/min (ref >60)
Glucose, Bld: 101 mg/dL — ABNORMAL HIGH (ref 70–99)
Potassium: 3.8 mmol/L (ref 3.5–5.1)
Sodium: 131 mmol/L — ABNORMAL LOW (ref 135–145)

## 2019-12-28 LAB — TROPONIN I (HIGH SENSITIVITY)
Troponin I (High Sensitivity): 6 ng/L (ref <18)
Troponin I (High Sensitivity): 7 ng/L (ref <18)

## 2019-12-28 LAB — FIBRIN DERIVATIVES D-DIMER (ARMC ONLY): Fibrin derivatives D-dimer (ARMC): 232.59 ng/mL (FEU) (ref 0.00–499.00)

## 2019-12-28 NOTE — Discharge Instructions (Addendum)
Please seek medical attention for any high fevers, chest pain, worsening shortness of breath, change in behavior, persistent vomiting, bloody stool or any other new or concerning symptoms.

## 2019-12-28 NOTE — ED Notes (Signed)
E-signature pad unavailable at time of discharge, pt given discharge paperwork and teaching provided. Pt voices understanding of d/c teaching

## 2019-12-28 NOTE — ED Triage Notes (Signed)
Pt to ED via POV c/o shortness of breath x 2 weeks. Pt states that his PCP told him that he needed to come to the ED to get a Chest X-ray. Pt SpO2 97% on room air. Pt is in NAD, able to speak in complete sentences at this time.

## 2019-12-28 NOTE — ED Notes (Signed)
Pt speaking with this RN in NAD, no SHOB noted. Resps even and nonlabored. A&Ox4.

## 2019-12-28 NOTE — ED Triage Notes (Signed)
FIRST NURSE NOTE- here for xray from PCP.  sats 97% RA at check in.

## 2019-12-28 NOTE — ED Provider Notes (Signed)
[facility] Emergency Department Provider Note ____________________________________________   I have reviewed the triage vital signs and the nursing notes.   HISTORY  Chief Complaint Shortness of Breath   History limited by: Not Limited   HPI Anthony Evans is a 69 y.o. male who presents to the emergency department today because of concerns for shortness of breath.  The patient states he has had shortness of breath over the past 2 weeks.  It is worse with exertion.  Patient denies any chest pain.  He denies any significant cough.  Denies any fevers.  Was seen by his primary care physician who obtained a chest x-ray.  Then was concerned that the patient might have a blood clot. Patient denies any leg swelling.    Records reviewed. Per medical record review patient has a history of GERD, psoriatic arthritis.   Past Medical History:  Diagnosis Date  . Allergy   . Arthritis    psoriatic  . Back pain   . Cataract   . Degenerative disc disease   . GERD (gastroesophageal reflux disease)   . Hypertension   . Reflux   . Seizures ([place])    last seizure about 10 years ago  . 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 Thalassemia   . Thyroid disease     Patient Active Problem List   Diagnosis Date Noted  . Syncope 11/05/2019  . C2 cervical fracture ([place]) 09/14/2018  . Hypertension 03/08/2018  . GERD (gastroesophageal reflux disease) 03/08/2018  . Thyroid disease 03/08/2018  . Degenerative disc disease 03/08/2018  . Psoriatic arthritis ([place]) 03/08/2018  . Back pain 03/08/2018  . Thalassemia minor 03/08/2018  . Seizures ([place]) 03/08/2018  . H/O cataract 03/08/2018  . Seasonal allergies 03/08/2018    Past Surgical History:  Procedure Laterality Date  . BACK SURGERY     screws and plate in c4 and c5  . CATARACT EXTRACTION W/PHACO Left 05/15/2015   Procedure: CATARACT EXTRACTION PHACO AND INTRAOCULAR LENS PLACEMENT (IOC);  Surgeon: Lyla Glassing, MD;   Location: [facility] ORS;  Service: Ophthalmology;  Laterality: Left;  [place]: 2:40.9 AP: 17.3 CDE: 27.88  . CERVICAL FUSION    . COLONOSCOPY WITH PROPOFOL N/A 06/14/2016   Procedure: COLONOSCOPY WITH PROPOFOL;  Surgeon: Manya Silvas, MD;  Location: [facility] ENDOSCOPY;  Service: Endoscopy;  Laterality: N/A;  . CORRECTION HAMMER TOE    . EYE SURGERY Right    cataract  . FRACTURE SURGERY    . HIP FRACTURE SURGERY Right    pins in hip  . JOINT REPLACEMENT    . KNEE ARTHROPLASTY Left    total knee replacement    Prior to Admission medications   Medication Sig Start Date End Date Taking? Authorizing Provider  Ascorbic Acid (VITAMIN C) 1000 MG tablet Take 1,000 mg by mouth daily.    [provider]  carbamazepine (TEGRETOL) 200 MG tablet Take 400-600 mg by mouth See admin instructions. Take 2 tablets (400MG ) by mouth 3 times daily with meals and 3 tablets (600MG ) by mouth at bedtime    [provider]  Cyanocobalamin (VITAMIN B 12) 100 MCG LOZG Take 100 mcg by mouth daily.    [provider]  cyclobenzaprine (FLEXERIL) 5 MG tablet Take 5 mg by mouth 3 (three) times daily as needed for muscle spasms.     [provider]  folic acid (FOLVITE) 1 MG tablet Take 1 mg by mouth daily.    [provider]  hydroxychloroquine (PLAQUENIL) 200 MG tablet Take 200 mg by mouth daily.  02/23/18   [provider]  levothyroxine (SYNTHROID, LEVOTHROID) 175 MCG tablet Take 175 mcg by mouth daily.  04/24/14   [provider]  meloxicam (MOBIC) 7.5 MG tablet Take 7.5 mg by mouth 2 (two) times daily.     [provider]  methotrexate (RHEUMATREX) 2.5 MG tablet Take 10 mg by mouth every Wednesday.  12/26/17   [provider]  Multiple Vitamins-Minerals (CENTRUM SILVER 50+WOMEN) TABS Take 1 tablet by mouth daily.    [provider]  primidone (MYSOLINE) 250 MG tablet Take 250 mg by mouth 3 (three) times daily.     [provider]   tamsulosin (FLOMAX) 0.4 MG CAPS capsule Take 1 capsule (0.4 mg total) by mouth daily. 09/15/18   Dustin Flock, MD  timolol (TIMOPTIC) 0.5 % ophthalmic solution Place 1 drop into both eyes 2 (two) times daily.  12/29/17   [provider]  triamcinolone cream (KENALOG) 0.1 % Apply 1 application topically See admin instructions. 01/21/15   [provider]  triamterene-hydrochlorothiazide (MAXZIDE) 75-50 MG per tablet Take 1 tablet by mouth daily.     [provider]  vitamin E 400 UNIT capsule Take 400 Units by mouth daily.    [provider]    Allergies Etanercept, Etodolac, and Rubbing alcohol [isopropyl alcohol]  Family History  Problem Relation Age of Onset  . Cancer Other   . Diabetes Other   . Arthritis Other     Social History Social History   Tobacco Use  . Smoking status: Never Smoker  . Smokeless tobacco: Never Used  Substance Use Topics  . Alcohol use: No  . Drug use: Not Currently    Review of Systems Constitutional: No fever/chills Eyes: No visual changes. ENT: No sore throat. Cardiovascular: Denies chest pain. Respiratory: Positive for shortness of breath. Gastrointestinal: No abdominal pain.  No nausea, no vomiting.  No diarrhea.   Genitourinary: Negative for dysuria. Musculoskeletal: Negative for back pain. Skin: Negative for rash. Neurological: Negative for headaches, focal weakness or numbness.  ____________________________________________   PHYSICAL EXAM:  VITAL SIGNS: ED Triage Vitals [12/28/19 1614]  Enc Vitals Group     BP (!) 147/75     Pulse Rate 98     Resp 16     Temp 99 F (37.2 C)     Temp src      SpO2 97 %     Weight 217 lb (98.4 kg)     Height 5\' 9"  (1.753 m)     Head Circumference      Peak Flow      Pain Score 0   Constitutional: Alert and oriented.  Eyes: Conjunctivae are normal.  ENT      Head: Normocephalic and atraumatic.      Nose: No congestion/rhinnorhea.      Mouth/Throat:  Mucous membranes are moist.      Neck: No stridor. Hematological/Lymphatic/Immunilogical: No cervical lymphadenopathy. Cardiovascular: Normal rate, regular rhythm.  No murmurs, rubs, or gallops.  Respiratory: Normal respiratory effort without tachypnea nor retractions. Breath sounds are clear and equal bilaterally. No wheezes/rales/rhonchi. Gastrointestinal: Soft and non tender. No rebound. No guarding.  Genitourinary: Deferred Musculoskeletal: Normal range of motion in all extremities. No lower extremity edema. Neurologic:  Normal speech and language. No gross focal neurologic deficits are appreciated.  Skin:  Skin is warm, dry and intact. No rash noted. Psychiatric: Mood and affect are normal. Speech and behavior  are normal. Patient exhibits appropriate insight and judgment.  ____________________________________________    LABS (pertinent positives/negatives)  Trop hs 7 BMP na 131, k 3.8, glu 101, cr 0.54 CBC wbc 6.3, hgb 10.4, plt 316 D-dimer 232.59 ____________________________________________   EKG  I, Nance Pear, attending physician, personally viewed and interpreted this EKG  EKG Time: 1616 Rate: 99 Rhythm: normal sinus rhythm Axis: left axis deviation Intervals: qtc 449 QRS: narrow ST changes: no st elevation Impression: abnormal ekg ____________________________________________    RADIOLOGY  CXR No acute cardiopulmonary disease  ____________________________________________   PROCEDURES  Procedures  ____________________________________________   INITIAL IMPRESSION / ASSESSMENT AND PLAN / ED COURSE  Pertinent labs & imaging results that were available during my care of the patient were reviewed by me and considered in my medical decision making (see chart for details).   Patient presented to the emergency department today because of concerns for shortness of breath that has been ongoing for the past 2 weeks.  On exam patient is in no respiratory  distress.  Lungs are clear.  Chest x-ray here without concerning findings.  Blood work does show an anemia however this appears to be patient's baseline.  Did get serial troponins both of which were negative.  D-dimer was also negative.  Will send off Covid test.  Patient is not hypoxic.  Discussed with patient continue follow-up with primary care. ____________________________________________   FINAL CLINICAL IMPRESSION(S) / ED DIAGNOSES  Final diagnoses:  Shortness of breath     Note: This dictation was prepared with Dragon dictation. Any transcriptional errors that result from this process are unintentional     Nance Pear, MD 12/28/19 2111

## 2019-12-30 LAB — NOVEL CORONAVIRUS, NAA (HOSP ORDER, SEND-OUT TO REF LAB; TAT 18-24 HRS): SARS-CoV-2, NAA: NOT DETECTED

## 2020-08-22 ENCOUNTER — Ambulatory Visit: Admit: 2020-08-22 | Payer: Medicare Other | Admitting: General Surgery

## 2020-08-22 SURGERY — COLONOSCOPY
Anesthesia: General

## 2021-01-14 DIAGNOSIS — R296 Repeated falls: Secondary | ICD-10-CM | POA: Insufficient documentation

## 2021-01-14 DIAGNOSIS — R262 Difficulty in walking, not elsewhere classified: Secondary | ICD-10-CM | POA: Insufficient documentation

## 2021-01-14 DIAGNOSIS — R400 Somnolence: Secondary | ICD-10-CM | POA: Insufficient documentation

## 2021-02-09 ENCOUNTER — Ambulatory Visit: Payer: Medicare Other | Admitting: Dermatology

## 2021-03-19 DIAGNOSIS — I69351 Hemiplegia and hemiparesis following cerebral infarction affecting right dominant side: Secondary | ICD-10-CM | POA: Insufficient documentation

## 2021-04-27 ENCOUNTER — Other Ambulatory Visit: Payer: Self-pay

## 2021-04-27 ENCOUNTER — Ambulatory Visit: Payer: Medicare Other | Admitting: Dermatology

## 2021-04-27 DIAGNOSIS — Z1283 Encounter for screening for malignant neoplasm of skin: Secondary | ICD-10-CM | POA: Diagnosis not present

## 2021-04-27 DIAGNOSIS — L821 Other seborrheic keratosis: Secondary | ICD-10-CM

## 2021-04-27 DIAGNOSIS — L814 Other melanin hyperpigmentation: Secondary | ICD-10-CM | POA: Diagnosis not present

## 2021-04-27 DIAGNOSIS — L578 Other skin changes due to chronic exposure to nonionizing radiation: Secondary | ICD-10-CM

## 2021-04-27 DIAGNOSIS — D1800 Hemangioma unspecified site: Secondary | ICD-10-CM

## 2021-04-27 DIAGNOSIS — L409 Psoriasis, unspecified: Secondary | ICD-10-CM | POA: Diagnosis not present

## 2021-04-27 DIAGNOSIS — L820 Inflamed seborrheic keratosis: Secondary | ICD-10-CM | POA: Diagnosis not present

## 2021-04-27 DIAGNOSIS — D229 Melanocytic nevi, unspecified: Secondary | ICD-10-CM

## 2021-04-27 MED ORDER — WYNZORA 0.005-0.064 % EX CREA
TOPICAL_CREAM | CUTANEOUS | 1 refills | Status: AC
Start: 2021-04-27 — End: ?

## 2021-04-27 NOTE — Progress Notes (Signed)
New Patient Visit  Subjective  Anthony Evans is a 70 y.o. male who presents for the following: Annual Exam.  Pt c/o irritated spots on his back,legs and face that will not go away. The patient presents for Total-Body Skin Exam (TBSE) for skin cancer screening and mole check.   The following portions of the chart were reviewed this encounter and updated as appropriate:   Tobacco  Allergies  Meds  Problems  Med Hx  Surg Hx  Fam Hx     Review of Systems:  No other skin or systemic complaints except as noted in HPI or Assessment and Plan.  Objective  Well appearing patient in no apparent distress; mood and affect are within normal limits.  A full examination was performed including scalp, head, eyes, ears, nose, lips, neck, chest, axillae, abdomen, back, buttocks, bilateral upper extremities, bilateral lower extremities, hands, feet, fingers, toes, fingernails, and toenails. All findings within normal limits unless otherwise noted below.  Objective  left upper eyelid margin x 1, trunk, legs,scalp.face x 20 (20): Erythematous keratotic or waxy stuck-on papule or plaque.   Objective  hands and feet: Dry peeling skin    Assessment & Plan  Inflamed seborrheic keratosis (21) left upper eyelid margin x 1; trunk, legs,scalp.face x 20 (20)  Destruction of lesion - trunk, legs,scalp.face x 20 Complexity: simple   Destruction method: cryotherapy   Informed consent: discussed and consent obtained   Timeout:  patient name, date of birth, surgical site, and procedure verified Lesion destroyed using liquid nitrogen: Yes   Region frozen until ice ball extended beyond lesion: Yes   Outcome: patient tolerated procedure well with no complications   Post-procedure details: wound care instructions given    Psoriasis hands and feet  Psoriasis is a chronic non-curable, but treatable genetic/hereditary disease that may have other systemic features affecting other organ systems such as  joints (Psoriatic Arthritis). It is associated with an increased risk of inflammatory bowel disease, heart disease, non-alcoholic fatty liver disease, and depression.     Start Wynzora cream apply to hands at bedtime 5 nights a week  Start otc Amlactin relief lotion to feet at bedtime   Ordered Medications: Calcipotriene-Betameth Diprop (WYNZORA) 0.005-0.064 % CREA  Skin cancer screening   Lentigines - Scattered tan macules - Due to sun exposure - Benign-appering, observe - Recommend daily broad spectrum sunscreen SPF 30+ to sun-exposed areas, reapply every 2 hours as needed. - Call for any changes  Seborrheic Keratoses - Stuck-on, waxy, tan-brown papules and/or plaques  - Benign-appearing - Discussed benign etiology and prognosis. - Observe - Call for any changes  Melanocytic Nevi - Tan-brown and/or pink-flesh-colored symmetric macules and papules - Benign appearing on exam today - Observation - Call clinic for new or changing moles - Recommend daily use of broad spectrum spf 30+ sunscreen to sun-exposed areas.   Hemangiomas - Red papules - Discussed benign nature - Observe - Call for any changes  Actinic Damage - Chronic condition, secondary to cumulative UV/sun exposure - diffuse scaly erythematous macules with underlying dyspigmentation - Recommend daily broad spectrum sunscreen SPF 30+ to sun-exposed areas, reapply every 2 hours as needed.  - Staying in the shade or wearing long sleeves, sun glasses (UVA+UVB protection) and wide brim hats (4-inch brim around the entire circumference of the hat) are also recommended for sun protection.  - Call for new or changing lesions.  Skin cancer screening performed today.  Return in about 3 months (around 07/28/2021) for Psoriasis .  IMarye Round, CMA, am acting as scribe for Sarina Ser, MD .  Documentation: I have reviewed the above documentation for accuracy and completeness, and I agree with the above.  Sarina Ser, MD

## 2021-04-27 NOTE — Patient Instructions (Addendum)

## 2021-04-28 ENCOUNTER — Telehealth: Payer: Self-pay

## 2021-04-28 NOTE — Telephone Encounter (Signed)
Advise pt to use Wynzora samples. May send: 1- Calcipotriene cream QHS to hands and aa for psoriasis prn. 2- Betamethasone cream  Qhs 4 days per week (under Calcipotriene cr) aa psoriasis hands and aa prn.  Advise pt that these 2 creams make up what is in [place].

## 2021-04-28 NOTE — Telephone Encounter (Signed)
Pt called Del[facility] Haring is not covered by his insurance.

## 2021-04-29 ENCOUNTER — Telehealth: Payer: Self-pay

## 2021-04-29 NOTE — Telephone Encounter (Signed)
LM on VM please call here to discuss will will call  In   1- Calcipotriene cream QHS to hands and aa for psoriasis prn. 2- Betamethasone cream  Qhs 4 days per week (under Calcipotriene cr) aa psoriasis hands and aa prn.

## 2021-05-02 ENCOUNTER — Encounter: Payer: Self-pay | Admitting: Dermatology

## 2021-07-30 ENCOUNTER — Ambulatory Visit: Payer: Medicare Other | Admitting: Dermatology

## 2021-08-19 DIAGNOSIS — Z85828 Personal history of other malignant neoplasm of skin: Secondary | ICD-10-CM | POA: Insufficient documentation

## 2021-12-22 ENCOUNTER — Emergency Department
Admission: EM | Admit: 2021-12-22 | Discharge: 2021-12-22 | Disposition: A | Discharge status: Home / Self Care | Payer: Medicare Other | Attending: Emergency Medicine | Admitting: Emergency Medicine

## 2021-12-22 ENCOUNTER — Emergency Department: Payer: Medicare Other

## 2021-12-22 ENCOUNTER — Other Ambulatory Visit: Payer: Self-pay

## 2021-12-22 ENCOUNTER — Encounter: Payer: Self-pay | Admitting: Emergency Medicine

## 2021-12-22 DIAGNOSIS — I10 Essential (primary) hypertension: Secondary | ICD-10-CM | POA: Diagnosis not present

## 2021-12-22 DIAGNOSIS — H81399 Other peripheral vertigo, unspecified ear: Secondary | ICD-10-CM

## 2021-12-22 DIAGNOSIS — R42 Dizziness and giddiness: Secondary | ICD-10-CM | POA: Diagnosis present

## 2021-12-22 LAB — CBC WITH DIFFERENTIAL/PLATELET
Abs Immature Granulocytes: 0.05 10*3/uL (ref 0.00–0.07)
Basophils Absolute: 0 10*3/uL (ref 0.0–0.1)
Basophils Relative: 1 %
Eosinophils Absolute: 0.1 10*3/uL (ref 0.0–0.5)
Eosinophils Relative: 1 %
HCT: 32.8 % — ABNORMAL LOW (ref 39.0–52.0)
Hemoglobin: 10.3 g/dL — ABNORMAL LOW (ref 13.0–17.0)
Immature Granulocytes: 1 %
Lymphocytes Relative: 17 %
Lymphs Abs: 1.4 10*3/uL (ref 0.7–4.0)
MCH: 22.9 pg — ABNORMAL LOW (ref 26.0–34.0)
MCHC: 31.4 g/dL (ref 30.0–36.0)
MCV: 73.1 fL — ABNORMAL LOW (ref 80.0–100.0)
Monocytes Absolute: 0.9 10*3/uL (ref 0.1–1.0)
Monocytes Relative: 12 %
Neutro Abs: 5.5 10*3/uL (ref 1.7–7.7)
Neutrophils Relative %: 68 %
Platelets: 335 10*3/uL (ref 150–400)
RBC: 4.49 MIL/uL (ref 4.22–5.81)
RDW: 19.2 % — ABNORMAL HIGH (ref 11.5–15.5)
WBC: 7.9 10*3/uL (ref 4.0–10.5)
nRBC: 0.8 % — ABNORMAL HIGH (ref 0.0–0.2)

## 2021-12-22 LAB — BASIC METABOLIC PANEL
Anion gap: 8 (ref 5–15)
BUN: 32 mg/dL — ABNORMAL HIGH (ref 8–23)
CO2: 32 mmol/L (ref 22–32)
Calcium: 10 mg/dL (ref 8.9–10.3)
Chloride: 99 mmol/L (ref 98–111)
Creatinine, Ser: 0.85 mg/dL (ref 0.61–1.24)
GFR, Estimated: >60 mL/min (ref >60)
Glucose, Bld: 135 mg/dL — ABNORMAL HIGH (ref 70–99)
Potassium: 3.8 mmol/L (ref 3.5–5.1)
Sodium: 139 mmol/L (ref 135–145)

## 2021-12-22 MED ORDER — MECLIZINE HCL 25 MG PO TABS
25.0000 mg | ORAL_TABLET | Freq: Once | ORAL | Status: AC
  Administered 2021-12-22: 25 mg via ORAL
  Filled 2021-12-22: qty 1

## 2021-12-22 MED ORDER — MECLIZINE HCL 25 MG PO TABS: 25.0000 mg | ORAL_TABLET | Freq: Three times a day (TID) | ORAL | 0 refills | Status: AC | PRN

## 2021-12-22 NOTE — ED Triage Notes (Signed)
Pt to ED via [facility] from home c/o Dizziness that started around thanksgiving but has gotten worse over the past week.  Pt denies any numbness or vision changes.  Denies CP, and SOB  Pt is A&Ox4.

## 2021-12-22 NOTE — ED Provider Notes (Signed)
[facility] Provider Note    Event Date/Time   First MD Initiated Contact with Patient 12/22/21 0234     (approximate)   History   Chief Complaint Dizziness   HPI  Anthony Evans is a 71 y.o. 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  Patient reports that for about the past month he has been dealing with an intermittent feeling of dizziness and feeling "swimmy headed."  He states that it typically occurs when he goes to stand up or walk, when he will feel like the room is spinning around him.  He denies any lightheadedness or feeling like he is going to pass out, has not had any chest pain or shortness of breath.  Symptoms have been increasing in frequency and severity over the past month and he has not been seen by medical provider for them until today.  He denies any fevers, cough, nausea, vomiting, diarrhea, or dysuria.  He has not had any vision changes, speech changes, numbness, or weakness.  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     Physical Exam   Triage Vital Signs: ED Triage Vitals [12/22/21 0232]  Enc Vitals Group     BP      Pulse      Resp      Temp      Temp src      SpO2      Weight      Height      Head Circumference      Peak Flow      Pain Score 0     Pain Loc      Pain Edu?      Excl. in [place]?     Most recent vital signs: Vitals:   12/22/21 0315 12/22/21 0330  BP: (!) 133/114 136/77  Pulse: (!) 102 98  Resp: 16 15  Temp:    SpO2: 96% 96%    Constitutional: Alert and oriented. Eyes: Conjunctivae are normal. Head: Atraumatic. Nose: No congestion/rhinnorhea. Mouth/Throat: Mucous membranes are moist.  Cardiovascular: Normal rate, regular rhythm. Grossly normal heart sounds.  2+ radial pulses bilaterally. Respiratory: Normal respiratory effort.   No retractions. Lungs CTAB. Gastrointestinal: Soft and nontender. No distention. Musculoskeletal: No lower extremity tenderness nor edema.  Neurologic:  Normal speech and language.  4+ out of 5 strength in right upper and lower extremities, 5 out of 5 strength in left upper and lower extremities.  Cranial nerves II through XII grossly intact.    ED Results / Procedures / Treatments   Labs (all labs ordered are listed, but only abnormal results are displayed) Labs Reviewed  CBC WITH DIFFERENTIAL/PLATELET - Abnormal; Notable for the following components:      Result Value   Hemoglobin 10.3 (*)    HCT 32.8 (*)    MCV 73.1 (*)    MCH 22.9 (*)    RDW 19.2 (*)    nRBC 0.8 (*)    All other components within normal limits  BASIC METABOLIC PANEL - Abnormal; Notable for the following components:   Glucose, Bld 135 (*)    BUN 32 (*)    All other components within normal limits     EKG  ED ECG REPORT I, Blake Divine, the attending physician, personally viewed and interpreted this ECG.  Date: 12/22/2021  EKG Time: 2:36  Rate: 107  Rhythm: sinus tachycardia, occasional PVC's  Axis: LAD  Intervals:none  ST&T Change: None  RADIOLOGY CT head reviewed by me with no obvious hemorrhage or midline shift.  PROCEDURES:  Critical Care performed: No  .1-3 Lead EKG Interpretation Performed by: Blake Divine, MD Authorized by: Blake Divine, MD     Interpretation: normal     ECG rate:  85-100   ECG rate assessment: normal     Rhythm: sinus rhythm     Ectopy: none     Conduction: normal     MEDICATIONS ORDERED IN ED: Medications  meclizine (ANTIVERT) tablet 25 mg (25 mg Oral Given 12/22/21 0253)     IMPRESSION / MDM / ASSESSMENT AND PLAN / ED COURSE  I reviewed the triage vital signs and the nursing notes.                              71 y.o. 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  Differential diagnosis includes, but is not limited to, benign peripheral vertigo, stroke, electrolyte abnormality, anemia, arrhythmia.  Patient is nontoxic-appearing and in no acute distress, vital signs remarkable for mild hypertension but otherwise reassuring.  EKG shows sinus tachycardia with occasional PVCs, overall low suspicion for cardiac etiology for his symptoms given description is more consistent with vertigo.  Low suspicion for central cause of vertigo given no focal neurologic deficits on exam and symptoms are positional.  We will screen CT head, check basic labs, and observe on cardiac monitor.  We will treat symptomatically with p.o. meclizine and reassess.  CT head reviewed by me with no obvious hemorrhage or midline shift, negative for acute process per radiology.  CBC and BMP show no anemia or electrolyte abnormality, no events noted on cardiac monitor.  Patient reports feeling better following dose of meclizine, now able to ambulate with minimal dizziness.  Admission was considered for further work-up of dizziness, but not indicated given low suspicion for stroke as symptoms most consistent with peripheral vertigo.  He is appropriate for outpatient PCP and ENT follow-up, will be prescribed meclizine.  He was counseled to return to the ED for new worsening symptoms, patient agrees with plan.  The patient is on the cardiac monitor to evaluate for evidence of arrhythmia and/or significant heart rate changes.       FINAL CLINICAL IMPRESSION(S) / ED DIAGNOSES   Final diagnoses:  Peripheral vertigo, unspecified laterality     Rx / DC Orders   ED Discharge Orders          Ordered    meclizine (ANTIVERT) 25 MG tablet  3 times daily PRN        12/22/21 0341             Note:  This document was prepared using  Dragon voice recognition software and may include unintentional dictation errors.   Blake Divine, MD 12/22/21 [phone]

## 2022-03-12 ENCOUNTER — Encounter: Payer: Self-pay | Admitting: Gastroenterology

## 2022-03-14 ENCOUNTER — Encounter: Payer: Self-pay | Admitting: Gastroenterology

## 2022-03-14 NOTE — H&P (Signed)
? ?Pre-Procedure H&P ?  ?Patient ID: Anthony Evans is a 71 y.o. male. ? ?Gastroenterology Provider: [name], DO ? ?Referring Provider: Octavia Bruckner, PA ?PCP: Tracie Harrier, MD ? ?Date: 03/15/2022 ? ?HPI ?[name] is a 71 y.o. male who presents today for Esophagogastroduodenoscopy and Colonoscopy for reflux; surveillance- phx  colon polyps. ? ?Pt c h/o cva with r hemiplegia, anemia, psoriatic arthritis on mtx, and b-thalassemia. ? ?Phx colon polyps; currently 1-2 bm per day- no melena, hematochezia, diarrhea or constipation. ?Notes increasing reflux sx 3-4x/day treated with otc medications such as rolaids. Attributes to overeating, especially trigger foods. Belching produces sour taste. No dysphagia/odynophagia or unexpected weight loss. Appetite stable ? ?06/2016 csy- 1 ta sig, 1 ta hf, 5 tas in the cecum plus 2 cm TA in cecum requiring lift, piecemeal resection with hot snare and 4 clip placement. Recommended repeat was for 2020, however, this was not performed due to [place] pandemic. IH also noted ? ?Egd/Csy 05/2014- HH otherwise normal. 4 TA sigmoid, HF and cecum lcoation ?05/2008- 5x49m TA- sigmoid, transverse, ascending colon, and cecum ? ?Hgb 10.5, mcv 74, plt 266 ast 19 ast 20 ?05/2013 ? ?Past Medical History:  ?Diagnosis Date  ? Allergy   ? Arthritis   ? psoriatic  ? Back pain   ? Cataract   ? Degenerative disc disease   ? Diverticulitis   ? GERD (gastroesophageal reflux disease)   ? Hypertension   ? Hypothyroidism   ? Psoriasis   ? Reflux   ? [place]   ? last seizure about 10 years ago  ? Stroke ([facility]   ? 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  ? Thalassemia   ? Thyroid disease   ? ? ?Past Surgical History:  ?Procedure Laterality Date  ? BACK SURGERY    ? screws and plate in c4 and c5  ? CATARACT EXTRACTION W/PHACO Left 05/15/2015  ? Procedure: CATARACT EXTRACTION PHACO AND INTRAOCULAR LENS PLACEMENT (IOC);  Surgeon: NLyla Glassing MD;  Location: [facility] ORS;  Service: Ophthalmology;  Laterality:  Left;  [place] 2:40.9 ?AP: 17.3 ?CDE: 27.88  ? CERVICAL FUSION    ? COLONOSCOPY WITH PROPOFOL N/A 06/14/2016  ? Procedure: COLONOSCOPY WITH PROPOFOL;  Surgeon: RManya Silvas MD;  [facility];  Service: Endoscopy;  Laterality: N/A;  ? CORRECTION HAMMER TOE    ? EYE SURGERY Right   ? cataract  ? FRACTURE SURGERY    ? HIP FRACTURE SURGERY Right   ? pins in hip  ? JOINT REPLACEMENT    ? KNEE ARTHROPLASTY Left   ? total knee replacement  ? ? ?Family History ?Father- colon polyps; gallbladder disease, ulcers ?No other h/o GI disease or malignancy ? ?Review of Systems  ?Constitutional:  Negative for activity change, appetite change, chills, diaphoresis, fatigue, fever and unexpected weight change.  ?HENT:  Negative for trouble swallowing and voice change.   ?Respiratory:  Negative for shortness of breath and wheezing.   ?Cardiovascular:  Negative for chest pain, palpitations and leg swelling.  ?Gastrointestinal:  Negative for abdominal distention, abdominal pain, anal bleeding, blood in stool, constipation, diarrhea, nausea and vomiting.  ?Musculoskeletal:  Negative for arthralgias and myalgias.  ?Skin:  Negative for color change and pallor.  ?Neurological:  Negative for dizziness, syncope and weakness.  ?Psychiatric/Behavioral:  Negative for confusion. The patient is not nervous/anxious.   ?All other systems reviewed and are negative.  ? ?Medications ?No current facility-administered medications on file prior to encounter.  ? ?Current Outpatient  Medications on File Prior to Encounter  ?Medication Sig Dispense Refill  ? amLODipine (NORVASC) 5 MG tablet Take 5 mg by mouth daily.    ? amoxicillin (AMOXIL) 500 MG capsule Take 500 mg by mouth 4 (four) times daily as needed (dental).    ? Ascorbic Acid (VITAMIN C) 1000 MG tablet Take 1,000 mg by mouth daily.    ? calcium carbonate (OSCAL) 1500 (600 Ca) MG TABS tablet Take by mouth 2 (two) times daily with a meal.    ? carbamazepine (TEGRETOL) 200 MG tablet Take 400  mg by mouth 3 (three) times daily.    ? cetirizine (ZYRTEC) 10 MG tablet Take 10 mg by mouth daily.    ? clobetasol cream (TEMOVATE) 3.56 % Apply 1 application. topically 2 (two) times daily.    ? HYDROcodone-acetaminophen (NORCO/VICODIN) 5-325 MG tablet Take 1 tablet by mouth as needed for moderate pain.    ? hydrocortisone 2.5 % ointment Apply topically as needed.    ? levothyroxine (SYNTHROID, LEVOTHROID) 175 MCG tablet Take 175 mcg by mouth daily.     ? meclizine (ANTIVERT) 25 MG tablet Take 1 tablet (25 mg total) by mouth 3 (three) times daily as needed for dizziness. 30 tablet 0  ? meloxicam (MOBIC) 7.5 MG tablet Take 7.5 mg by mouth 2 (two) times daily.     ? methotrexate (RHEUMATREX) 2.5 MG tablet Take 10 mg by mouth every Wednesday.     ? Multiple Vitamins-Minerals (CENTRUM SILVER 50+WOMEN) TABS Take 1 tablet by mouth daily.    ? primidone (MYSOLINE) 250 MG tablet Take 250 mg by mouth 3 (three) times daily.     ? tamsulosin (FLOMAX) 0.4 MG CAPS capsule Take 1 capsule (0.4 mg total) by mouth daily. 30 capsule 0  ? triamterene-hydrochlorothiazide (MAXZIDE) 75-50 MG per tablet Take 1 tablet by mouth daily.     ? vitamin E 400 UNIT capsule Take 400 Units by mouth daily.    ? Calcipotriene-Betameth Diprop (WYNZORA) 0.005-0.064 % CREA Apply to hands 5 nights a week at bedtime 60 g 1  ? Cyanocobalamin (VITAMIN B 12) 100 MCG LOZG Take 100 mcg by mouth daily.    ? cyclobenzaprine (FLEXERIL) 5 MG tablet Take 5 mg by mouth 3 (three) times daily as needed for muscle spasms.     ? fenofibrate (TRICOR) 48 MG tablet Take 48 mg by mouth daily.    ? folic acid (FOLVITE) 1 MG tablet Take 1 mg by mouth daily.    ? gabapentin (NEURONTIN) 300 MG capsule Take 300 mg by mouth 2 (two) times daily.    ? hydroxychloroquine (PLAQUENIL) 200 MG tablet Take 200 mg by mouth daily.  (Patient not taking: Reported on 03/15/2022)    ? timolol (TIMOPTIC) 0.5 % ophthalmic solution Place 1 drop into both eyes daily.  2  ? triamcinolone cream  (KENALOG) 0.1 % Apply 1 application topically See admin instructions.    ? ? ?Pertinent medications related to GI and procedure were reviewed by me with the patient prior to the procedure ? ? ?Current Facility-Administered Medications:  ?  0.9 %  sodium chloride infusion, , Intravenous, Continuous, [name], DO ?  ?  ? ?Allergies  ?Allergen Reactions  ? Etanercept Nausea And Vomiting  ? Etodolac Nausea And Vomiting  ? Rubbing Alcohol [Isopropyl Alcohol] Rash  ? ?Allergies were reviewed by me prior to the procedure ? ?Objective  ? ?Body mass index is 31.31 kg/m?. ?Vitals:  ? 03/15/22 0707  ?BP: Marland Kitchen)  156/92  ?Pulse: (!) 118  ?Resp: 16  ?Temp: 97.6 ?F (36.4 ?C)  ?TempSrc: Temporal  ?SpO2: 96%  ?Weight: 96.2 kg  ?Height: '5\' 9"'$  (1.753 m)  ? ? ? ?Physical Exam ?Vitals and nursing note reviewed.  ?Constitutional:   ?   General: He is not in acute distress. ?   Appearance: Normal appearance. He is not ill-appearing, toxic-appearing or diaphoretic.  ?HENT:  ?   Head: Normocephalic and atraumatic.  ?   Nose: Nose normal.  ?   Mouth/Throat:  ?   Mouth: Mucous membranes are moist.  ?   Pharynx: Oropharynx is clear.  ?Eyes:  ?   General: No scleral icterus. ?   Extraocular Movements: Extraocular movements intact.  ?Cardiovascular:  ?   Rate and Rhythm: Regular rhythm. Tachycardia present.  ?   Heart sounds: Normal heart sounds. No murmur heard. ?  No friction rub. No gallop.  ?Pulmonary:  ?   Effort: Pulmonary effort is normal. No respiratory distress.  ?   Breath sounds: Normal breath sounds. No wheezing, rhonchi or rales.  ?Abdominal:  ?   General: Bowel sounds are normal. There is no distension.  ?   Palpations: Abdomen is soft.  ?   Tenderness: There is no abdominal tenderness. There is no guarding or rebound.  ?Musculoskeletal:  ?   Cervical back: Neck supple.  ?   Right lower leg: No edema.  ?   Left lower leg: No edema.  ?Skin: ?   General: Skin is warm and dry.  ?   Coloration: Skin is not jaundiced or pale.   ?Neurological:  ?   Mental Status: He is alert and oriented to person, place, and time. Mental status is at baseline.  ?   Comments: right sided UE and LE weakness/deficits from previous cva  ?Psychiatr

## 2022-03-15 ENCOUNTER — Ambulatory Visit: Payer: Medicare Other | Admitting: Certified Registered Nurse Anesthetist

## 2022-03-15 ENCOUNTER — Other Ambulatory Visit: Payer: Self-pay

## 2022-03-15 ENCOUNTER — Encounter
Admission: RE | Disposition: A | Discharge status: Home / Self Care | Payer: Self-pay | Source: Home / Self Care | Attending: Gastroenterology

## 2022-03-15 ENCOUNTER — Encounter: Payer: Self-pay | Admitting: Gastroenterology

## 2022-03-15 ENCOUNTER — Ambulatory Visit
Admission: RE | Admit: 2022-03-15 | Discharge: 2022-03-15 | Disposition: A | Discharge status: Home / Self Care | Payer: Medicare Other | Attending: Gastroenterology | Admitting: Gastroenterology

## 2022-03-15 DIAGNOSIS — D120 Benign neoplasm of cecum: Secondary | ICD-10-CM | POA: Diagnosis not present

## 2022-03-15 DIAGNOSIS — K259 Gastric ulcer, unspecified as acute or chronic, without hemorrhage or perforation: Secondary | ICD-10-CM | POA: Diagnosis not present

## 2022-03-15 DIAGNOSIS — Z79899 Other long term (current) drug therapy: Secondary | ICD-10-CM | POA: Insufficient documentation

## 2022-03-15 DIAGNOSIS — D125 Benign neoplasm of sigmoid colon: Secondary | ICD-10-CM | POA: Insufficient documentation

## 2022-03-15 DIAGNOSIS — K644 Residual hemorrhoidal skin tags: Secondary | ICD-10-CM | POA: Diagnosis not present

## 2022-03-15 DIAGNOSIS — K317 Polyp of stomach and duodenum: Secondary | ICD-10-CM | POA: Insufficient documentation

## 2022-03-15 DIAGNOSIS — D124 Benign neoplasm of descending colon: Secondary | ICD-10-CM | POA: Diagnosis not present

## 2022-03-15 DIAGNOSIS — I10 Essential (primary) hypertension: Secondary | ICD-10-CM | POA: Insufficient documentation

## 2022-03-15 DIAGNOSIS — I69351 Hemiplegia and hemiparesis following cerebral infarction affecting right dominant side: Secondary | ICD-10-CM | POA: Insufficient documentation

## 2022-03-15 DIAGNOSIS — Z791 Long term (current) use of non-steroidal anti-inflammatories (NSAID): Secondary | ICD-10-CM | POA: Diagnosis not present

## 2022-03-15 DIAGNOSIS — Z1211 Encounter for screening for malignant neoplasm of colon: Secondary | ICD-10-CM | POA: Insufficient documentation

## 2022-03-15 DIAGNOSIS — D122 Benign neoplasm of ascending colon: Secondary | ICD-10-CM | POA: Insufficient documentation

## 2022-03-15 DIAGNOSIS — D123 Benign neoplasm of transverse colon: Secondary | ICD-10-CM | POA: Diagnosis not present

## 2022-03-15 DIAGNOSIS — Z86010 Personal history of colonic polyps: Secondary | ICD-10-CM | POA: Diagnosis not present

## 2022-03-15 DIAGNOSIS — K449 Diaphragmatic hernia without obstruction or gangrene: Secondary | ICD-10-CM | POA: Diagnosis not present

## 2022-03-15 DIAGNOSIS — K648 Other hemorrhoids: Secondary | ICD-10-CM | POA: Diagnosis not present

## 2022-03-15 DIAGNOSIS — Z79890 Hormone replacement therapy (postmenopausal): Secondary | ICD-10-CM | POA: Diagnosis not present

## 2022-03-15 DIAGNOSIS — E039 Hypothyroidism, unspecified: Secondary | ICD-10-CM | POA: Diagnosis not present

## 2022-03-15 DIAGNOSIS — M1990 Unspecified osteoarthritis, unspecified site: Secondary | ICD-10-CM | POA: Diagnosis not present

## 2022-03-15 DIAGNOSIS — K2281 Esophageal polyp: Secondary | ICD-10-CM | POA: Insufficient documentation

## 2022-03-15 DIAGNOSIS — K2100 Gastro-esophageal reflux disease with esophagitis, without bleeding: Secondary | ICD-10-CM | POA: Diagnosis not present

## 2022-03-15 DIAGNOSIS — K2289 Other specified disease of esophagus: Secondary | ICD-10-CM | POA: Diagnosis not present

## 2022-03-15 HISTORY — PX: COLONOSCOPY WITH PROPOFOL: SHX5780

## 2022-03-15 HISTORY — DX: Psoriasis, unspecified: L40.9

## 2022-03-15 HISTORY — PX: ESOPHAGOGASTRODUODENOSCOPY (EGD) WITH PROPOFOL: SHX5813

## 2022-03-15 HISTORY — DX: Hypothyroidism, unspecified: E03.9

## 2022-03-15 HISTORY — DX: Diverticulitis of intestine, part unspecified, without perforation or abscess without bleeding: K57.92

## 2022-03-15 SURGERY — COLONOSCOPY WITH PROPOFOL
Anesthesia: General

## 2022-03-15 MED ORDER — GLYCOPYRROLATE 0.2 MG/ML IJ SOLN
INTRAMUSCULAR | Status: AC
  Filled 2022-03-15: qty 1

## 2022-03-15 MED ORDER — PROPOFOL 500 MG/50ML IV EMUL
INTRAVENOUS | Status: AC
  Filled 2022-03-15: qty 50

## 2022-03-15 MED ORDER — LIDOCAINE HCL (CARDIAC) PF 100 MG/5ML IV SOSY
PREFILLED_SYRINGE | INTRAVENOUS | Status: DC | PRN
  Administered 2022-03-15: 50 mg via INTRAVENOUS

## 2022-03-15 MED ORDER — GLYCOPYRROLATE 0.2 MG/ML IJ SOLN
INTRAMUSCULAR | Status: DC | PRN
  Administered 2022-03-15: .1 mg via INTRAVENOUS

## 2022-03-15 MED ORDER — SODIUM CHLORIDE 0.9 % IV SOLN: INTRAVENOUS | Status: DC

## 2022-03-15 MED ORDER — PROPOFOL 10 MG/ML IV BOLUS
INTRAVENOUS | Status: DC | PRN
  Administered 2022-03-15: 60 mg via INTRAVENOUS

## 2022-03-15 MED ORDER — PHENYLEPHRINE 40 MCG/ML (10ML) SYRINGE FOR IV PUSH (FOR BLOOD PRESSURE SUPPORT)
PREFILLED_SYRINGE | INTRAVENOUS | Status: DC | PRN
  Administered 2022-03-15: 80 ug via INTRAVENOUS

## 2022-03-15 MED ORDER — PROPOFOL 500 MG/50ML IV EMUL
INTRAVENOUS | Status: DC | PRN
  Administered 2022-03-15: 150 ug/kg/min via INTRAVENOUS

## 2022-03-15 MED ORDER — LIDOCAINE HCL (PF) 2 % IJ SOLN
INTRAMUSCULAR | Status: AC
  Filled 2022-03-15: qty 5

## 2022-03-15 MED ORDER — PHENYLEPHRINE 40 MCG/ML (10ML) SYRINGE FOR IV PUSH (FOR BLOOD PRESSURE SUPPORT)
PREFILLED_SYRINGE | INTRAVENOUS | Status: AC
  Filled 2022-03-15: qty 10

## 2022-03-15 MED ORDER — SPOT INK MARKER SYRINGE KIT
PACK | SUBMUCOSAL | Status: DC | PRN
  Administered 2022-03-15: 1.5 mL via SUBMUCOSAL

## 2022-03-15 NOTE — Anesthesia Preprocedure Evaluation (Addendum)
Anesthesia Evaluation  ?Patient identified by MRN, date of birth, ID band ?Patient awake ? ? ? ?Reviewed: ?Allergy & Precautions, NPO status , Patient's Chart, lab work & pertinent test results ? ?History of Anesthesia Complications ?Negative for: history of anesthetic complications ? ?Airway ?Mallampati: IV ? ? ?Neck ROM: Full ? ? ? Dental ?no notable dental hx. ? ?  ?Pulmonary ? ?  ?Pulmonary exam normal ?breath sounds clear to auscultation ? ? ? ? ? ? Cardiovascular ?hypertension, Normal cardiovascular exam ?Rhythm:Regular Rate:Normal ? ?ECG 12/22/21:  ?Sinus tachycardia ?Multiple ventricular premature complexes ?Probable left atrial enlargement ?LAD, consider left anterior fascicular block ?Baseline wander in lead(s) V3 ?  ?Neuro/Psych ?Seizures - (last in 2000),  CVA (residual right arm numbness and weakness)   ? GI/Hepatic ?GERD  ,  ?Endo/Other  ?Hypothyroidism  ? Renal/GU ?negative Renal ROS  ? ?  ?Musculoskeletal ? ?(+) Arthritis ,  ? Abdominal ?  ?Peds ? Hematology ? ?(+) Blood dyscrasia, anemia ,   ?Anesthesia Other Findings ? ? Reproductive/Obstetrics ? ?  ? ? ? ? ? ? ? ? ? ? ? ? ? ?  ?  ? ? ? ? ? ? ? ?Anesthesia Physical ?Anesthesia Plan ? ?ASA: 3 ? ?Anesthesia Plan: General  ? ?Post-op Pain Management:   ? ?Induction: Intravenous ? ?PONV Risk Score and Plan: 2 and Propofol infusion, TIVA and Treatment may vary due to age or medical condition ? ?Airway Management Planned: Natural Airway ? ?Additional Equipment:  ? ?Intra-op Plan:  ? ?Post-operative Plan:  ? ?Informed Consent: I have reviewed the patients History and Physical, chart, labs and discussed the procedure including the risks, benefits and alternatives for the proposed anesthesia with the patient or authorized representative who has indicated his/her understanding and acceptance.  ? ? ? ? ? ?Plan Discussed with: CRNA ? ?Anesthesia Plan Comments: (LMA/GETA backup discussed.  Patient consented for risks of  anesthesia including but not limited to:  ?- adverse reactions to medications ?- damage to eyes, teeth, lips or other oral mucosa ?- nerve damage due to positioning  ?- sore throat or hoarseness ?- damage to heart, brain, nerves, lungs, other parts of body or loss of life ? ?Informed patient about role of CRNA in peri- and intra-operative care.  Patient voiced understanding.)  ? ? ? ? ? ? ?Anesthesia Quick Evaluation ? ?

## 2022-03-15 NOTE — Op Note (Signed)
[facility] ?Gastroenterology ?Patient Name: Anthony Evans ?Procedure Date: 03/15/2022 7:38 AM ?MRN: [id number] ?Account #: [id number] ?Date of Birth: 12-09-1950 ?Admit Type: Outpatient ?Age: 71 ?Room: [facility] ENDO ROOM 2 ?Gender: Male ?Note Status: Finalized ?Instrument Name: Upper-Endoscope [id number] ?Procedure:             Upper GI endoscopy ?Indications:           Heartburn ?Providers:             [name] DO, DO ?Referring MD:          Tracie Harrier, MD (Referring MD) ?Medicines:             Monitored Anesthesia Care ?Complications:         No immediate complications. Estimated blood loss:  ?                       Minimal. ?Procedure:             Pre-Anesthesia Assessment: ?                       - Prior to the procedure, a History and Physical was  ?                       performed, and patient medications and allergies were  ?                       reviewed. The patient is competent. The risks and  ?                       benefits of the procedure and the sedation options and  ?                       risks were discussed with the patient. All questions  ?                       were answered and informed consent was obtained.  ?                       Patient identification and proposed procedure were  ?                       verified by the physician, the nurse, the anesthetist  ?                       and the technician in the endoscopy suite. Mental  ?                       Status Examination: alert and oriented. Airway  ?                       Examination: normal oropharyngeal airway and neck  ?                       mobility. Respiratory Examination: clear to  ?                       auscultation. CV Examination: tachycardia noted.  ?  Prophylactic Antibiotics: The patient does not require  ?                       prophylactic antibiotics. Prior Anticoagulants: The  ?                       patient has taken no previous anticoagulant or  ?                        antiplatelet agents. ASA Grade Assessment: III - A  ?                       patient with severe systemic disease. After reviewing  ?                       the risks and benefits, the patient was deemed in  ?                       satisfactory condition to undergo the procedure. The  ?                       anesthesia plan was to use monitored anesthesia care  ?                       (MAC). Immediately prior to administration of  ?                       medications, the patient was re-assessed for adequacy  ?                       to receive sedatives. The heart rate, respiratory  ?                       rate, oxygen saturations, blood pressure, adequacy of  ?                       pulmonary ventilation, and response to care were  ?                       monitored throughout the procedure. The physical  ?                       status of the patient was re-assessed after the  ?                       procedure. ?                       After obtaining informed consent, the endoscope was  ?                       passed under direct vision. Throughout the procedure,  ?                       the patient's blood pressure, pulse, and oxygen  ?                       saturations were monitored continuously. The Endoscope  ?  was introduced through the mouth, and advanced to the  ?                       second part of duodenum. The upper GI endoscopy was  ?                       accomplished without difficulty. The patient tolerated  ?                       the procedure well. ?Findings: ?     Food (residue) was found in the duodenal bulb. Estimated blood loss:  ?     none. ?     The duodenal bulb, first portion of the duodenum and second portion of  ?     the duodenum were normal. Biopsies for histology were taken with a cold  ?     forceps for evaluation of celiac disease. Estimated blood loss was  ?     minimal. ?     A small hiatal hernia was present. Estimated blood loss: none. ?     Multiple 1 to 3 mm  sessile polyps with no bleeding and no stigmata of  ?     recent bleeding were found in the gastric fundus. Estimated blood loss:  ?     none. ?     One non-bleeding superficial gastric ulcer with no stigmata of bleeding  ?     was found in the gastric antrum. The lesion was 5 mm in largest  ?     dimension. Biopsies were taken with a cold forceps for histology.  ?     Estimated blood loss was minimal. ?     Normal mucosa was found in the entire examined stomach. Biopsies were  ?     taken with a cold forceps for Helicobacter pylori testing. Estimated  ?     blood loss was minimal. ?     LA Grade C (one or more mucosal breaks continuous between tops of 2 or  ?     more mucosal folds, less than 75% circumference) esophagitis with no  ?     bleeding was found. Biopsies were taken with a cold forceps for  ?     histology. Estimated blood loss was minimal. ?     Esophagogastric landmarks were identified: the gastroesophageal junction  ?     was found at 40 cm from the incisors. ?     A single 3 mm polyp with no bleeding was found 40 cm from the incisors.  ?     Biopsies were taken with a cold forceps for histology. Estimated blood  ?     loss was minimal. ?     The exam of the esophagus was otherwise normal. ?Impression:            - Retained food in the duodenum. ?                       - Normal duodenal bulb, first portion of the duodenum  ?                       and second portion of the duodenum. Biopsied. ?                       - Small hiatal hernia. ?                       -  Multiple gastric polyps. ?                       - Non-bleeding gastric ulcer with no stigmata of  ?                       bleeding. Biopsied. ?                       - Normal mucosa was found in the entire stomach.  ?                       Biopsied. ?                       - LA Grade C reflux esophagitis with no bleeding.  ?                       Biopsied. ?                       - Esophagogastric landmarks identified. ?                        - Esophageal polyp(s) were found. Biopsied. ?Recommendation:        - Discharge patient to home. ?                       - Resume previous diet. ?                       - Use Protonix (pantoprazole) 40 mg PO BID for 8 weeks. ?                       - No aspirin, ibuprofen, naproxen, or other  ?                       non-steroidal anti-inflammatory drugs for 5 days after  ?                       biopsy. ?                       - Continue present medications. ?                       - Await pathology results. ?                       - Repeat upper endoscopy in 8 weeks to check healing. ?                       - Return to GI office as previously scheduled. ?                       - The findings and recommendations were discussed with  ?                       the patient. ?Procedure Code(s):     --- Professional --- ?                       [phone], Esophagogastroduodenoscopy,  flexible,  ?                       transoral; with biopsy, single or multiple ?Diagnosis Code(s):     --- Professional --- ?                       K44.9, Diaphragmatic hernia without obstruction or  ?                       gangrene ?                       K31.7, Polyp of stomach and duodenum ?                       K25.9, Gastric ulcer, unspecified as acute or chronic,  ?                       without hemorrhage or perforation ?                       K21.00, Gastro-esophageal reflux disease with  ?                       esophagitis, without bleeding ?                       K22.8, Other specified diseases of esophagus ?                       R12, Heartburn ?CPT copyright 2019 [employer]. All rights reserved. ?The codes documented in this report are preliminary and upon coder review may  ?be revised to meet current compliance requirements. ?Attending Participation: ?     I personally performed the entire procedure. ?Volney American, DO ?[name] DO, DO ?03/15/2022 8:08:53 AM ?This report has been signed  electronically. ?Number of Addenda: 0 ?Note Initiated On: 03/15/2022 7:38 AM ?Estimated Blood Loss:  Estimated blood loss was minimal. ?     [facility] ?

## 2022-03-15 NOTE — Anesthesia Postprocedure Evaluation (Signed)
Anesthesia Post Note ? ?Patient: Anthony Evans ? ?Procedure(s) Performed: COLONOSCOPY WITH PROPOFOL ?ESOPHAGOGASTRODUODENOSCOPY (EGD) WITH PROPOFOL ? ?Patient location during evaluation: PACU ?Anesthesia Type: General ?Level of consciousness: awake and alert, oriented and patient cooperative ?Pain management: pain level controlled ?Vital Signs Assessment: post-procedure vital signs reviewed and stable ?Respiratory status: spontaneous breathing, nonlabored ventilation and respiratory function stable ?Cardiovascular status: blood pressure returned to baseline and stable ?Postop Assessment: adequate PO intake ?Anesthetic complications: no ? ? ?No notable events documented. ? ? ?Last Vitals:  ?Vitals:  ? 03/15/22 0923 03/15/22 0933  ?BP: (!) 152/92 125/90  ?Pulse: (!) 104 99  ?Resp: 20 17  ?Temp:    ?SpO2: 97% 99%  ?  ?Last Pain:  ?Vitals:  ? 03/15/22 0933  ?TempSrc:   ?PainSc: 0-No pain  ? ? ?  ?  ?  ?  ?  ?  ? ?Darrin Nipper ? ? ? ? ?

## 2022-03-15 NOTE — Op Note (Signed)
[facility] ?Gastroenterology ?Patient Name: Anthony Evans ?Procedure Date: 03/15/2022 7:38 AM ?MRN: [id number] ?Account #: [id number] ?Date of Birth: 1951-06-09 ?Admit Type: Outpatient ?Age: 71 ?Room: [facility] ENDO ROOM 2 ?Gender: Male ?Note Status: Finalized ?Instrument Name: Colonoscope [id number] ?Procedure:             Colonoscopy ?Indications:           High risk colon cancer surveillance: Personal history  ?                       of colonic polyps ?Providers:             [name] DO, DO ?Referring MD:          Tracie Harrier, MD (Referring MD) ?Medicines:             Monitored Anesthesia Care ?Complications:         No immediate complications. Estimated blood loss:  ?                       Minimal. ?Procedure:             Pre-Anesthesia Assessment: ?                       - Prior to the procedure, a History and Physical was  ?                       performed, and patient medications and allergies were  ?                       reviewed. The patient is competent. The risks and  ?                       benefits of the procedure and the sedation options and  ?                       risks were discussed with the patient. All questions  ?                       were answered and informed consent was obtained.  ?                       Patient identification and proposed procedure were  ?                       verified by the physician, the nurse, the anesthetist  ?                       and the technician in the endoscopy suite. Mental  ?                       Status Examination: alert and oriented. Airway  ?                       Examination: normal oropharyngeal airway and neck  ?                       mobility. Respiratory Examination: clear to  ?  auscultation. CV Examination: tachycardia noted.  ?                       Prophylactic Antibiotics: The patient does not require  ?                       prophylactic antibiotics. Prior Anticoagulants: The  ?                        patient has taken no previous anticoagulant or  ?                       antiplatelet agents. ASA Grade Assessment: III - A  ?                       patient with severe systemic disease. After reviewing  ?                       the risks and benefits, the patient was deemed in  ?                       satisfactory condition to undergo the procedure. The  ?                       anesthesia plan was to use monitored anesthesia care  ?                       (MAC). Immediately prior to administration of  ?                       medications, the patient was re-assessed for adequacy  ?                       to receive sedatives. The heart rate, respiratory  ?                       rate, oxygen saturations, blood pressure, adequacy of  ?                       pulmonary ventilation, and response to care were  ?                       monitored throughout the procedure. The physical  ?                       status of the patient was re-assessed after the  ?                       procedure. ?                       After obtaining informed consent, the colonoscope was  ?                       passed under direct vision. Throughout the procedure,  ?                       the patient's blood pressure, pulse, and oxygen  ?  saturations were monitored continuously. The  ?                       Colonoscope was introduced through the anus and  ?                       advanced to the the cecum, identified by appendiceal  ?                       orifice and ileocecal valve. The colonoscopy was  ?                       somewhat difficult due to a redundant colon.  ?                       Successful completion of the procedure was aided by  ?                       changing the patient to a prone position, using manual  ?                       pressure, straightening and shortening the scope to  ?                       obtain bowel loop reduction, using scope torsion,  ?                       applying abdominal  pressure and lavage. The patient  ?                       tolerated the procedure well. The quality of the bowel  ?                       preparation was evaluated using the BBPS [facility]el  ?                       Preparation Scale) with scores of: Right Colon = 2  ?                       (minor amount of residual staining, small fragments of  ?                       stool and/or opaque liquid, but mucosa seen well),  ?                       Transverse Colon = 2 (minor amount of residual  ?                       staining, small fragments of stool and/or opaque  ?                       liquid, but mucosa seen well) and Left Colon = 2  ?                       (minor amount of residual staining, small fragments of  ?  stool and/or opaque liquid, but mucosa seen well). The  ?                       total BBPS score equals 6. The quality of the bowel  ?                       preparation was fair. ?Findings: ?     Hemorrhoids were found on perianal exam. ?     The digital rectal exam was normal. Pertinent negatives include normal  ?     sphincter tone. ?     Non-bleeding external and internal hemorrhoids were found during  ?     retroflexion. Estimated blood loss: none. ?     Five sessile polyps were found in the ascending colon. The polyps were 3  ?     to 6 mm in size. These polyps were removed with a cold snare. Resection  ?     and retrieval were complete. Estimated blood loss was minimal. ?     Three sessile polyps were found in the cecum. The polyps were 3 to 4 mm  ?     in size. Estimated blood loss was minimal. ?     A 1 to 2 mm polyp was found in the transverse colon. The polyp was  ?     sessile. The polyp was removed with a cold biopsy forceps. Resection and  ?     retrieval were complete. Estimated blood loss was minimal. ?     A 3 to 4 mm polyp was found in the transverse colon. The polyp was  ?     sessile. The polyp was removed with a cold snare. Resection and  ?     retrieval were  complete. Estimated blood loss was minimal. ?     A 3 to 4 mm polyp was found in the descending colon. The polyp was  ?     sessile. The polyp was removed with a cold snare. Resection and  ?     retrieval were complete. Estimated blood loss was minimal. ?     Two sessile polyps were found in the rectum and sigmoid colon. The  ?     polyps were 1 to 2 mm in size. These polyps were removed with a cold  ?     biopsy forceps. Resection and retrieval were complete. Estimated blood  ?     loss was minimal. ?     A 6 to 8 mm polyp was found in the sigmoid colon. The polyp was sessile.  ?     The polyp was removed with a cold snare. Resection and retrieval were  ?     complete. Estimated blood loss was minimal. Area was tattooed with an  ?     injection of 1 mL of [place] ink. Estimated blood loss was minimal. ?     The exam was otherwise without abnormality on direct and retroflexion  ?     views. ?Impression:            - Preparation of the colon was fair. ?                       - Hemorrhoids found on perianal exam. ?                       -  Non-bleeding external and internal hemorrhoids. ?                       - Five 3 to 6 mm polyps in the ascending colon,  ?                       removed with a cold snare. Resected and retrieved. ?                       - Three 3 to 4 mm polyps in the cecum. ?                       - One 1 to 2 mm polyp in the transverse colon, removed  ?                       with a cold biopsy forceps. Resected and retrieved. ?                       - One 3 to 4 mm polyp in the transverse colon, removed  ?                       with a cold snare. Resected and retrieved. ?                       - One 3 to 4 mm polyp in the descending colon, removed  ?                       with a cold snare. Resected and retrieved. ?                       - Two 1 to 2 mm polyps in the rectum and in the  ?                       sigmoid colon, removed with a cold biopsy forceps.  ?                       Resected and  retrieved. ?                       - One 6 to 8 mm polyp in the sigmoid colon, removed  ?                       with a cold snare. Resected and retrieved. Tattooed. ?                       - The examination

## 2022-03-15 NOTE — Interval H&P Note (Signed)
History and Physical Interval Note: Preprocedure H&P from 03/15/22 ? was reviewed and there was no interval change after seeing and examining the patient.  Written consent was obtained from the patient after discussion of risks, benefits, and alternatives. Patient has consented to proceed with Esophagogastroduodenoscopy and Colonoscopy with possible intervention ? ? ?03/15/2022 ?7:45 AM ? 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  The various methods of treatment have been discussed with the patient and family. After consideration of risks, benefits and other options for treatment, the patient has consented to  Procedure(s): ?COLONOSCOPY WITH PROPOFOL (N/A) ?ESOPHAGOGASTRODUODENOSCOPY (EGD) WITH PROPOFOL (N/A) as a surgical intervention.  The patient's history has been reviewed, patient examined, no change in status, stable for surgery.  I have reviewed the patient's chart and labs.  Questions were answered to the patient's satisfaction.   ? ? ?[name] ? ? ?

## 2022-03-15 NOTE — Anesthesia Procedure Notes (Signed)
Procedure Name: [place] ?Date/Time: 03/15/2022 7:44 AM ?Performed by: Tollie Eth, CRNA ?Pre-anesthesia Checklist: Patient identified, Emergency Drugs available, Suction available and Patient being monitored ?Patient Re-evaluated:Patient Re-evaluated prior to induction ?Oxygen Delivery Method: Simple face mask ?Induction Type: IV induction ?Placement Confirmation: positive ETCO2 ? ? ? ? ?

## 2022-03-15 NOTE — Transfer of Care (Signed)
Immediate Anesthesia Transfer of Care Note ? ?Patient: DMARCUS DECICCO ? ?Procedure(s) Performed: COLONOSCOPY WITH PROPOFOL ?ESOPHAGOGASTRODUODENOSCOPY (EGD) WITH PROPOFOL ? ?Patient Location: Endoscopy Unit ? ?Anesthesia Type:General ? ?Level of Consciousness: drowsy ? ?Airway & Oxygen Therapy: Patient Spontanous Breathing ? ?Post-op Assessment: Report given to RN and Post -op Vital signs reviewed and stable ? ?Post vital signs: Reviewed and stable ? ?Last Vitals:  ?Vitals Value Taken Time  ?BP 137/64 03/15/22 0914  ?Temp    ?Pulse 96 03/15/22 0915  ?Resp 14 03/15/22 0915  ?SpO2 100 % 03/15/22 0915  ?Vitals shown include unvalidated device data. ? ?Last Pain:  ?Vitals:  ? 03/15/22 0707  ?TempSrc: Temporal  ?PainSc: 0-No pain  ?   ? ?  ? ?Complications: No notable events documented. ?

## 2022-03-16 ENCOUNTER — Encounter: Payer: Self-pay | Admitting: Gastroenterology

## 2022-03-17 LAB — SURGICAL PATHOLOGY

## 2022-04-22 ENCOUNTER — Ambulatory Visit: Payer: Medicare Other | Attending: Family Medicine | Admitting: Physical Therapy

## 2022-04-22 ENCOUNTER — Other Ambulatory Visit: Payer: Self-pay

## 2022-04-22 ENCOUNTER — Telehealth: Payer: Self-pay | Admitting: Physical Therapy

## 2022-04-22 ENCOUNTER — Encounter: Payer: Self-pay | Admitting: Physical Therapy

## 2022-04-22 DIAGNOSIS — I6930 Unspecified sequelae of cerebral infarction: Secondary | ICD-10-CM | POA: Insufficient documentation

## 2022-04-22 DIAGNOSIS — R262 Difficulty in walking, not elsewhere classified: Secondary | ICD-10-CM | POA: Insufficient documentation

## 2022-04-22 NOTE — Telephone Encounter (Signed)
Called pt because of increased openings to see if he wants to come in earlier for initial eval. Pt does want to come in and his slot will be moved up from Monday to today.

## 2022-04-22 NOTE — Therapy (Signed)
OUTPATIENT PHYSICAL THERAPY LOWER EXTREMITY EVALUATION   Patient Name: Anthony Evans MRN: [id number] DOB:[birth year], 71 y.o., male Today's Date: 04/23/2022   PT End of Session - 04/22/22 1100     Visit Number 1    Number of Visits 16    Date for PT Re-Evaluation 06/17/22    Authorization Type [employer] Medicare    PT Start Time 1100    PT Stop Time 1145    PT Time Calculation (min) 45 min    Equipment Utilized During Treatment Gait belt    Activity Tolerance Patient tolerated treatment well    Behavior During Therapy WFL for tasks assessed/performed             Past Medical History:  Diagnosis Date   Allergy    Arthritis    psoriatic   Back pain    Cataract    Degenerative disc disease    Diverticulitis    GERD (gastroesophageal reflux disease)    Hypertension    Hypothyroidism    Psoriasis    Reflux    Seizures ([place])    last seizure about 10 years ago   Stroke [facility])    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   Thalassemia    Thyroid disease    Past Surgical History:  Procedure Laterality Date   BACK SURGERY     screws and plate in c4 and c5   CATARACT EXTRACTION W/PHACO Left 05/15/2015   Procedure: CATARACT EXTRACTION PHACO AND INTRAOCULAR LENS PLACEMENT ([place]);  Surgeon: Lyla Glassing, MD;  Location: [facility] ORS;  Service: Ophthalmology;  Laterality: Left;  [place]: 2:40.9 AP: 17.3 CDE: 27.88   CERVICAL FUSION     COLONOSCOPY WITH PROPOFOL N/A 06/14/2016   Procedure: COLONOSCOPY WITH PROPOFOL;  Surgeon: Manya Silvas, MD;  Location: [facility] ENDOSCOPY;  Service: Endoscopy;  Laterality: N/A;   COLONOSCOPY WITH PROPOFOL N/A 03/15/2022   Procedure: COLONOSCOPY WITH PROPOFOL;  Surgeon: [name], DO;  Location: [facility] ENDOSCOPY;  Service: Gastroenterology;  Laterality: N/A;   CORRECTION HAMMER TOE     ESOPHAGOGASTRODUODENOSCOPY (EGD) WITH PROPOFOL N/A 03/15/2022   Procedure: ESOPHAGOGASTRODUODENOSCOPY (EGD) WITH PROPOFOL;  Surgeon: [name], DO;  Location: [facility] ENDOSCOPY;   Service: Gastroenterology;  Laterality: N/A;   EYE SURGERY Right    cataract   FRACTURE SURGERY     HIP FRACTURE SURGERY Right    pins in hip   JOINT REPLACEMENT     KNEE ARTHROPLASTY Left    total knee replacement   Patient Active Problem List   Diagnosis Date Noted   Syncope 11/05/2019   C2 cervical fracture ([place]) 09/14/2018   Hypertension 03/08/2018   GERD (gastroesophageal reflux disease) 03/08/2018   Thyroid disease 03/08/2018   Degenerative disc disease 03/08/2018   Psoriatic arthritis ([place]) 03/08/2018   Back pain 03/08/2018   Thalassemia minor 03/08/2018   Seizures ([place]) 03/08/2018   H/O cataract 03/08/2018   Seasonal allergies 03/08/2018    PCP: Tracie Harrier, MD   REFERRING PROVIDER: Allene Dillon, FNP   REFERRING DIAG: Imbalance   THERAPY DIAG:  No diagnosis found.  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 He has not fallen, but has had several near falls which sound mechanical in nature.   PERTINENT HISTORY: 04/19/22 Dr. Charlsie Merles  [place]   HPI The patient is a 71 year old gentleman followed by Dr. Joya Salm at [facility] and presents today for follow-up of low back pain with radiation into the left anterior thigh consistent with an L3 radiculitis. His symptoms began around the fall 2016 without preceding injury or trauma. His symptoms are worsened with heavy lifting, bending, twisting, and occasionally with standing. He has some relief with sitting and rest. Chiropractic manipulation with Dr. Laveda Abbe often provides moderate to good relief. Medications have included Mobic 7.5 mg twice daily as needed, Flexeril 5 mg as needed, and an occasional hydrocodone. He prefers to avoid narcotics if possible.  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 He is now on permanent disability.   He was last evaluated on 06/27/2020 at which time he was experiencing left low back pain rating to the left anterior thigh consistent with an L3 radiculitis for which he was scheduled for another epidural steroid injection. He was to continue with meloxicam and referred for balance therapy at [facility].    At today's visit ports that he has experienced a flare of pain radiating from the left low back into the left anterior thigh stopping at the knee over the last several months and is hopeful to schedule for another epidural steroid injection. He continues meloxicam. He also continues to experience some imbalance and states that he never heard from [facility] for balance therapy I will refer him today. On exam today he is without any noted weakness in the left lower extremity.   PAIN:  Are you having pain? Yes: NPRS scale: 6/10 Pain location: Middle of spine in lower back, h/o of DDD  Pain description: Achy  Aggravating factors: Colder and when rain is coming  Relieving factors: Raining  PRECAUTIONS: None  WEIGHT BEARING RESTRICTIONS No  FALLS:  Has patient fallen in last 6 months? No  LIVING ENVIRONMENT: Lives with: lives alone Lives in: House/apartment Stairs:  Ramped entrance  Has following equipment at home: None  OCCUPATION: Retired   PLOF: Independent  PATIENT GOALS To feel more steady on his feet and less pain while walking across room.     OBJECTIVE:   VITALS: BP 134/88 SpO2 97 HR 106    DIAGNOSTIC FINDINGS: CLINICAL DATA:  71 year old male status post fall backwards with subsequent weakness.   EXAM: MRI LUMBAR SPINE WITHOUT CONTRAST   TECHNIQUE: Multiplanar, multisequence MR imaging of the lumbar spine was performed. No intravenous contrast was administered.   COMPARISON:  Lumbar MRI 05/17/2019. Lumbar spine CT 10/10/2017.   FINDINGS: Segmentation: Transitional anatomy. For the  purposes of this report hypoplastic or absent ribs are designated at T12 (full size ribs at T11) resulting in the lowest open disc space at L5-S1, and this is the same numbering system used on the June MRI. Correlation with radiographs is recommended prior to any operative intervention.   Alignment: Stable since June. Moderate chronic levoconvex lumbar scoliosis with superimposed mild grade 1 anterolisthesis of L4 on L5.   Vertebrae: Chronic T12 compression fracture is stable with mild-to-moderate loss of vertebral height. Superimposed mild left L1 superior endplate compression or large Schmorl's node. Additional chronic endplate changes in the lower lumbar spine. No marrow edema or evidence of acute osseous abnormality. Normal background bone marrow signal. Stable and benign appearing chronic cystic appearing bone lesion in the medial right iliac on series 14, image 36. Intact visible sacrum and SI joints.   Conus medullaris and cauda equina: Conus extends to the  T12-L1 level. No lower spinal cord or conus signal abnormality.   Paraspinal and other soft tissues: Stable since June.   Disc levels:   L1-L2 with no stable visible lower thoracic spine through spinal stenosis. There is multilevel chronic moderate and severe lower thoracic and upper lumbar neural foraminal stenosis greater on the right.   L2-L3: Moderate multifactorial spinal stenosis with severe right lateral recess stenosis is stable related to rightward disc osteophyte complex and posterior element hypertrophy. Mild left and severe right L2 foraminal stenosis is stable.   L3-L4: Moderate to severe multifactorial spinal and right lateral recess stenosis is stable related to circumferential disc osteophyte complex and moderate to severe posterior element hypertrophy. Moderate to severe left and moderate right L3 foraminal stenosis is stable.   L4-L5: Moderate to severe chronic spinal and left greater than  right lateral recess stenosis appears stable in the setting of grade 1 anterolisthesis. A small subligamentous synovial cyst has developed on the left (series 11, image 27), although does not exacerbate the stenosis at this time. Moderate to severe left and mild right L4 foraminal stenosis is stable.   L5-S1: Left side assimilation joint with left far lateral disc osteophyte complex and moderate posterior element hypertrophy again noted. No spinal or lateral recess stenosis. Moderate to severe left and mild right L5 foraminal stenosis is stable.   IMPRESSION: 1. Stable MRI appearance of the lumbar spine since June. 2. Transitional anatomy. Correlation with radiographs is recommended prior to any operative intervention. 3. Chronic levoconvex lumbar scoliosis and grade 1 anterolisthesis at L4-L5 with widespread advanced disc, endplate, and posterior element degeneration. 4. Moderate to severe multifactorial spinal and lateral recess stenosis is stable from L2-L3 through L4-L5. 5. Widespread lower thoracic and lumbar moderate to severe neural foraminal stenosis is stable.     Electronically Signed   By: Genevie Ann M.D.   On: 11/05/2019 02:28  PATIENT SURVEYS:  FOTO 47/51  COGNITION:  Overall cognitive status: Within functional limits for tasks assessed     SENSATION: Light touch: Impaired on RLE and RUE   MUSCLE LENGTH: Deferred to next visit  Hamstrings: Right NT deg; Left NT deg Marcello Moores test: Right NT deg; Left NT deg  POSTURE:  Left sided trunk  lean   PALPATION: N/a  LE ROM:   Active  Right 03/29/2022 Left 03/29/2022  Hip flexion 120 120  Hip extension 30 30  Hip abduction 45 45  Hip adduction 30 30  Hip internal rotation    Hip external rotation    Knee flexion 135 135  Knee extension 0 0  Ankle dorsiflexion 5 20  Ankle plantarflexion 50 50  Ankle inversion 35 35  Ankle eversion 15 15   (Blank rows = not tested)    LE MMT:  MMT Right 04/23/2022  Left 04/23/2022  Hip flexion 5 5  Hip extension    Hip abduction    Hip adduction    Hip internal rotation    Hip external rotation    Knee flexion 5 5  Knee extension 5 5  Ankle dorsiflexion 2+ 5  Ankle plantarflexion 5 5  Ankle inversion    Ankle eversion     (Blank rows = not tested)        LOWER EXTREMITY SPECIAL TESTS: NT   FUNCTIONAL TESTS:   5 Times Sit to Stand: 0 reps  (Individuals with times that exceed the listed time have worse than average performance - predictive of recurrent falls in healthy community-living subjects)         -  43-69 y.o. 11.4 sec         - 70-79 y.o. 12.6 sec         - 80-89 y.o. 14.8 sec                30 sec chair stands: 0 reps   <12 reps for Males 70-74  *A below average score indicates a risk for falls  STEADI 4-Stage Balance Test: (inability to maintain tandem stance for 10 sec = increased risk for falls) - feet together, eyes open, noncompliant surface: 10/10 sec - semi-tandem stance, eyes open, noncompliant surface: 10/10 sec - tandem stances, eyes open, noncompliant surface: 3/ 0 sec - single leg stance: NT     GAIT: Distance walked: Right sided hemiplegia, decreased stance time on RLE and decreased step length on LLE  Assistive device utilized: None Level of assistance: Complete Independence Comments: H/o of left sided CVA as a child     TODAY'S TREATMENT: Sit to Stand with 1 UE support 1 x 5 Romberg 1 x 5 for 10 sec hold  -min VC to maintain upright posture    PATIENT EDUCATION:  Education details: Form and technique for appropriate exercise  Person educated: Patient Education method: Explanation Education comprehension: verbalized understanding, returned demonstration, and verbal cues required   HOME EXERCISE PROGRAM: Access Code: [email or web address] URL: [email or web address] Date: 04/22/2022 Prepared by: Bradly Chris  Exercises - Sit to Stand with Counter Support  - 1 x daily - 3 x weekly  - 3 sets - 5 reps - Romberg Stance  - 1 x daily - 7 x weekly - 1 sets - 5 reps - 10 hold  ASSESSMENT:  CLINICAL IMPRESSION: Patient is a 71 y.o. white male who was seen today for physical therapy evaluation and treatment for imbalance in the setting of right sided hemiparesis form past stroke. Pt is at an increased risk for falls with decreased LE strength and endurance and static balance. Dynamic balance testing or FGA deferred until next visit due to lack of time. Despite lack of subjective report of tripping because of toe drag, pt's right sided foot drop could be causing increased unsteadiness when walking.  As a result of aforementioned deficits, pt is experiencing increased difficulty with shopping and negotiating community level distances because of unsteadiness and increased low back pain which requires him to take several rest breaks while in grocery store. He will benefit from skilled PT to increase LE strength and endurance, static and dynamic balance, and improve toe clearance with an orthotic prescription to avoid increased right toe drag and increased risk for falling.    OBJECTIVE IMPAIRMENTS Abnormal gait, decreased balance, difficulty walking, decreased ROM, decreased strength, impaired flexibility, impaired sensation, and pain.   ACTIVITY LIMITATIONS shopping and community activity.   PERSONAL FACTORS Fitness, Past/current experiences, Time since onset of injury/illness/exacerbation, and 3+ comorbidities: h/o stroke, psoriatic arthritis, degenerative disc disease  are also affecting patient's functional outcome.    REHAB POTENTIAL: Fair chronicity of right hemiparesis and reduced familial and social support   CLINICAL DECISION MAKING: Stable/uncomplicated  EVALUATION COMPLEXITY: Low   GOALS: Goals reviewed with patient? No  SHORT TERM GOALS: Target date: 05/07/2022  (Remove Blue Hyperlink)  Pt will be independent with HEP in order to improve strength and balance in order  to decrease fall risk and improve function at home and work. Baseline: N/a Goal status: INITIAL  2.  Patient will perform sit to stand without use of UE support to demonstrate improve LE strength.  Baseline: Unable to perform without use of hands  Goal status: INITIAL    LONG TERM GOALS: Target date: 06/18/2022  (Remove Blue Hyperlink)  Patient will have improved function and activity level as evidenced by an increase in FOTO score to predicted score. Baseline: 47/51 Goal status: INITIAL  2.  Patient will improve 5 x STS to <11.4 sec to demonstrate LE strength that places him at a decreased risk for falls. Baseline: Unable to perform because needs to use UE support  Goal status: INITIAL  3. Patient will hold tandem stance for >=10 sec to exhibit improved static balance to decrease his risk of falling.   Baseline: Tandem stance R/L 3 sec/0 sec   Goal status: INITIAL  4.  Patient will improve his FGA score my 5 points to demonstrate a minimal significant improvement in his dynamic balance thus decreasing his risk for falling.  Baseline: NT Goal status: INITIAL    PLAN: PT FREQUENCY: 1-2x/week  PT DURATION: 8 weeks  PLANNED INTERVENTIONS: Therapeutic exercises, Therapeutic activity, Neuromuscular re-education, Balance training, Gait training, Patient/Family education, Joint manipulation, Joint mobilization, Stair training, Orthotic/Fit training, DME instructions, Aquatic Therapy, Dry Needling, Cryotherapy, Moist heat, Manual therapy, and Re-evaluation  PLAN FOR NEXT SESSION: FGA, progress LE strengthening and static balance exercises, finish measuring hip strength, ROM, and flexibility measurements, spasticity assessment   Bradly Chris PT, DPT  04/23/2022, 10:08 AM

## 2022-04-26 ENCOUNTER — Ambulatory Visit: Payer: Medicare Other | Admitting: Physical Therapy

## 2022-04-26 ENCOUNTER — Encounter: Payer: Self-pay | Admitting: Physical Therapy

## 2022-04-26 DIAGNOSIS — I6930 Unspecified sequelae of cerebral infarction: Secondary | ICD-10-CM

## 2022-04-26 DIAGNOSIS — R262 Difficulty in walking, not elsewhere classified: Secondary | ICD-10-CM | POA: Diagnosis not present

## 2022-04-26 NOTE — Therapy (Signed)
OUTPATIENT PHYSICAL THERAPY TREATMENT NOTE   Patient Name: Anthony Evans MRN: [id number] DOB:[birth year], 71 y.o., male Today's Date: 04/26/2022  PCP: Dr. Tracie Harrier  REFERRING PROVIDER: Dr. Allene Dillon   END OF SESSION:   PT End of Session - 04/26/22 1151     Visit Number 2    Number of Visits 16    Date for PT Re-Evaluation 06/17/22    Authorization Type [employer] Medicare    PT Start Time 1150    PT Stop Time 1230    PT Time Calculation (min) 40 min    Equipment Utilized During Treatment Gait belt    Activity Tolerance Patient tolerated treatment well    Behavior During Therapy WFL for tasks assessed/performed             Past Medical History:  Diagnosis Date   Allergy    Arthritis    psoriatic   Back pain    Cataract    Degenerative disc disease    Diverticulitis    GERD (gastroesophageal reflux disease)    Hypertension    Hypothyroidism    Psoriasis    Reflux    Seizures ([place])    last seizure about 10 years ago   Stroke [facility])    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   Thalassemia    Thyroid disease    Past Surgical History:  Procedure Laterality Date   BACK SURGERY     screws and plate in c4 and c5   CATARACT EXTRACTION W/PHACO Left 05/15/2015   Procedure: CATARACT EXTRACTION PHACO AND INTRAOCULAR LENS PLACEMENT ([place]);  Surgeon: Lyla Glassing, MD;  Location: [facility] ORS;  Service: Ophthalmology;  Laterality: Left;  [place]: 2:40.9 AP: 17.3 CDE: 27.88   CERVICAL FUSION     COLONOSCOPY WITH PROPOFOL N/A 06/14/2016   Procedure: COLONOSCOPY WITH PROPOFOL;  Surgeon: Manya Silvas, MD;  Location: [facility] ENDOSCOPY;  Service: Endoscopy;  Laterality: N/A;   COLONOSCOPY WITH PROPOFOL N/A 03/15/2022   Procedure: COLONOSCOPY WITH PROPOFOL;  Surgeon: [name], DO;  Location: [facility] ENDOSCOPY;  Service: Gastroenterology;  Laterality: N/A;   CORRECTION HAMMER TOE     ESOPHAGOGASTRODUODENOSCOPY (EGD) WITH PROPOFOL N/A 03/15/2022   Procedure: ESOPHAGOGASTRODUODENOSCOPY (EGD)  WITH PROPOFOL;  Surgeon: [name], DO;  Location[facility] ENDOSCOPY;  Service: Gastroenterology;  Laterality: N/A;   EYE SURGERY Right    cataract   FRACTURE SURGERY     HIP FRACTURE SURGERY Right    pins in hip   JOINT REPLACEMENT     KNEE ARTHROPLASTY Left    total knee replacement   Patient Active Problem List   Diagnosis Date Noted   Syncope 11/05/2019   C2 cervical fracture ([place]) 09/14/2018   Hypertension 03/08/2018   GERD (gastroesophageal reflux disease) 03/08/2018   Thyroid disease 03/08/2018   Degenerative disc disease 03/08/2018   Psoriatic arthritis ([place]) 03/08/2018   Back pain 03/08/2018   Thalassemia minor 03/08/2018   Seizures ([place]) 03/08/2018   H/O cataract 03/08/2018   Seasonal allergies 03/08/2018    REFERRING DIAG: Imbalance   THERAPY DIAG:  Difficulty in walking, not elsewhere classified  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 Ladona Ridgel    HPI The patient is a 71 year old gentleman followed by Dr. Joya Salm at [facility] and presents today for follow-up of low back pain with radiation into the left anterior thigh consistent with an L3 radiculitis. His symptoms began around the fall  2016 without preceding injury or trauma. His symptoms are worsened with heavy lifting, bending, twisting, and occasionally with standing. He has some relief with sitting and rest. Chiropractic manipulation with Dr. Laveda Abbe often provides moderate to good relief. Medications have included Mobic 7.5 mg twice daily as needed, Flexeril 5 mg as needed, and an occasional hydrocodone. He prefers to avoid narcotics if possible.   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 He is now on permanent disability.    He was last evaluated on 06/27/2020 at which time he was experiencing left low back pain rating to the left anterior thigh consistent with  an L3 radiculitis for which he was scheduled for another epidural steroid injection. He was to continue with meloxicam and referred for balance therapy at [facility].       At today's visit ports that he has experienced a flare of pain radiating from the left low back into the left anterior thigh stopping at the knee over the last several months and is hopeful to schedule for another epidural steroid injection. He continues meloxicam. He also continues to experience some imbalance and states that he never heard from [facility] for balance therapy I will refer him today. On exam today he is without any noted weakness in the left lower extremity.   PRECAUTIONS: FALLS  SUBJECTIVE: Patient reports increased low back pain that starts in left side of low back and radiates down the anterior surface of his left leg. Pt notes ongoing shortness of breath with activity and that this started shortly after retiring. He needs to take increased seated rest breaks because of his low back pain.    PAIN:  Are you having pain? Yes: NPRS scale: 3/10 Pain location: low back pain that starts in left side of low back and radiates down the anterior surface of his left leg Pain description: Achy  Aggravating factors: Walking and lifting objects  Relieving factors: Sitting or resting    OBJECTIVE:   VITALS: BP 134/88 SpO2 97 HR 106      DIAGNOSTIC FINDINGS: CLINICAL DATA:  72 year old male status post fall backwards with subsequent weakness.   EXAM: MRI LUMBAR SPINE WITHOUT CONTRAST   TECHNIQUE: Multiplanar, multisequence MR imaging of the lumbar spine was performed. No intravenous contrast was administered.   COMPARISON:  Lumbar MRI 05/17/2019. Lumbar spine CT 10/10/2017.   FINDINGS: Segmentation: Transitional anatomy. For the purposes of this report hypoplastic or absent ribs are designated at T12 (full size ribs at T11) resulting in the lowest open disc space at L5-S1, and this is the same numbering system used  on the June MRI. Correlation with radiographs is recommended prior to any operative intervention.   Alignment: Stable since June. Moderate chronic levoconvex lumbar scoliosis with superimposed mild grade 1 anterolisthesis of L4 on L5.   Vertebrae: Chronic T12 compression fracture is stable with mild-to-moderate loss of vertebral height. Superimposed mild left L1 superior endplate compression or large Schmorl's node. Additional chronic endplate changes in the lower lumbar spine. No marrow edema or evidence of acute osseous abnormality. Normal background bone marrow signal. Stable and benign appearing chronic cystic appearing bone lesion in the medial right iliac on series 14, image 36. Intact visible sacrum and SI joints.   Conus medullaris and cauda equina: Conus extends to the T12-L1 level. No lower spinal cord or conus signal abnormality.   Paraspinal and other soft tissues: Stable since June.   Disc levels:   L1-L2 with no stable visible  lower thoracic spine through spinal stenosis. There is multilevel chronic moderate and severe lower thoracic and upper lumbar neural foraminal stenosis greater on the right.   L2-L3: Moderate multifactorial spinal stenosis with severe right lateral recess stenosis is stable related to rightward disc osteophyte complex and posterior element hypertrophy. Mild left and severe right L2 foraminal stenosis is stable.   L3-L4: Moderate to severe multifactorial spinal and right lateral recess stenosis is stable related to circumferential disc osteophyte complex and moderate to severe posterior element hypertrophy. Moderate to severe left and moderate right L3 foraminal stenosis is stable.   L4-L5: Moderate to severe chronic spinal and left greater than right lateral recess stenosis appears stable in the setting of grade 1 anterolisthesis. A small subligamentous synovial cyst has developed on the left (series 11, image 27), although does not  exacerbate the stenosis at this time. Moderate to severe left and mild right L4 foraminal stenosis is stable.   L5-S1: Left side assimilation joint with left far lateral disc osteophyte complex and moderate posterior element hypertrophy again noted. No spinal or lateral recess stenosis. Moderate to severe left and mild right L5 foraminal stenosis is stable.   IMPRESSION: 1. Stable MRI appearance of the lumbar spine since June. 2. Transitional anatomy. Correlation with radiographs is recommended prior to any operative intervention. 3. Chronic levoconvex lumbar scoliosis and grade 1 anterolisthesis at L4-L5 with widespread advanced disc, endplate, and posterior element degeneration. 4. Moderate to severe multifactorial spinal and lateral recess stenosis is stable from L2-L3 through L4-L5. 5. Widespread lower thoracic and lumbar moderate to severe neural foraminal stenosis is stable.     Electronically Signed   By: Genevie Ann M.D.   On: 11/05/2019 02:28   PATIENT SURVEYS:  FOTO 47/51   COGNITION:           Overall cognitive status: Within functional limits for tasks assessed                          SENSATION: Light touch: Impaired on RLE and RUE    MUSCLE LENGTH: Deferred to next visit  Hamstrings: Right NT deg; Left NT deg Marcello Moores test: Right NT deg; Left NT deg   POSTURE:  Left sided trunk  lean    PALPATION: N/a   LE ROM:     Active  Right 03/29/2022 Left 03/29/2022  Hip flexion 120 120  Hip extension 30 30  Hip abduction 45 45  Hip adduction 30 30  Hip internal rotation      Hip external rotation      Knee flexion 135 135  Knee extension 0 0  Ankle dorsiflexion 5 20  Ankle plantarflexion 50 50  Ankle inversion 35 35  Ankle eversion 15 15   (Blank rows = not tested)      LE MMT:   MMT Right 04/23/2022 Left 04/23/2022  Hip flexion 5 5  Hip extension      Hip abduction      Hip adduction      Hip internal rotation      Hip external rotation       Knee flexion 5 5  Knee extension 5 5  Ankle dorsiflexion 2+ 5  Ankle plantarflexion 5 5  Ankle inversion      Ankle eversion       (Blank rows = not tested)      LOWER EXTREMITY SPECIAL TESTS: NT     FUNCTIONAL TESTS:    5  Times Sit to Stand: 0 reps  (Individuals with times that exceed the listed time have worse than average performance - predictive of recurrent falls in healthy community-living subjects)         - 11-69 y.o. 11.4 sec         - 60-79 y.o. 12.6 sec         - 80-89 y.o. 14.8 sec                 30 sec chair stands: 0 reps   <12 reps for Males 70-74   *A below average score indicates a risk for falls   STEADI 4-Stage Balance Test: (inability to maintain tandem stance for 10 sec = increased risk for falls) - feet together, eyes open, noncompliant surface: 10/10 sec - semi-tandem stance, eyes open, noncompliant surface: 10/10 sec - tandem stances, eyes open, noncompliant surface: 3/ 0 sec - single leg stance: NT        GAIT: Distance walked: Right sided hemiplegia, decreased stance time on RLE and decreased step length on LLE  Assistive device utilized: None Level of assistance: Complete Independence Comments: H/o of left sided CVA as a child        TODAY'S TREATMENT:  04/26/22:  Nu-Step Level 2 with seat at 10      FGA  -Item 1 Gait Level Surface: Mild Impairment 2 -Item 2 Change in Gait Speed: Mild Impairment 2 -Item 3 Gait with Horizontal Head Turns: Mild Impairment 2 -Item 4 Gait with Vertical Head Turns: Mild Impairment 2 -Item 5 Gait with Pivot Turn: Mild Impairment 2 -Item 6 Step Over Obstacle: Severe Impairment  0 -Item 7 Gait with Narrow Base of Support: Severe Impairment 0 -Item 8 Gait with Eyes Closed: Severe Impairment 0            -Item 9 Ambulating Backwards: Severe Impairment 0 -Item 10 Steps: Moderate Impairment 1 Total: 13/30           * Score of <=22/30 indicates that patient is at increased risk for falls.               Sit to Stand                Initial Eval:  Sit to Stand with 1 UE support 1 x 5 Romberg 1 x 5 for 10 sec hold  -min VC to maintain upright posture      PATIENT EDUCATION:  Education details: Form and technique for appropriate exercise  Person educated: Patient Education method: Explanation Education comprehension: verbalized understanding, returned demonstration, and verbal cues required     HOME EXERCISE PROGRAM: Access Code: [email or web address] URL: [email or web address] Date: 04/26/2022 Prepared by: Bradly Chris  Exercises - Sit to Stand with Counter Support  - 1 x daily - 3 x weekly - 3 sets - 5 reps - Romberg Stance  - 1 x daily - 7 x weekly - 1 sets - 5 reps - 10 hold - Seated Sciatic Tensioner  - 1 x daily - 7 x weekly - 1 sets - 10 reps   ASSESSMENT:   CLINICAL IMPRESSION:  Pt presents at increased falls risk based on FGA indicating decreased dynamic balance deficits with severe deficits with reduced visual input, stepping over obstacles, and walking backwards. He continues to exhibit decreased LE strength where he continues to use UE support to perform sit to stand. He will continue to benefit from skilled PT to increase LE strength and endurance, static and dynamic balance,  and improve toe clearance with an orthotic prescription to avoid increased right toe drag and increased risk for falling.    OBJECTIVE IMPAIRMENTS Abnormal gait, decreased balance, difficulty walking, decreased ROM, decreased strength, impaired flexibility, impaired sensation, and pain.    ACTIVITY LIMITATIONS shopping and community activity.    PERSONAL FACTORS Fitness, Past/current experiences, Time since onset of injury/illness/exacerbation, and 3+ comorbidities: h/o stroke, psoriatic arthritis, degenerative disc disease  are also affecting patient's functional outcome.      REHAB POTENTIAL: Fair chronicity of right hemiparesis and reduced familial and social support    CLINICAL  DECISION MAKING: Stable/uncomplicated   EVALUATION COMPLEXITY: Low     GOALS: Goals reviewed with patient? No   SHORT TERM GOALS: Target date: 05/07/2022  (Remove Blue Hyperlink)   Pt will be independent with HEP in order to improve strength and balance in order to decrease fall risk and improve function at home and work. Baseline: N/a Goal status: ONGOING    2.  Patient will perform sit to stand without use of UE support to demonstrate improve LE strength.  Baseline: Unable to perform without use of hands  Goal status: ONGOING        LONG TERM GOALS: Target date: 06/18/2022  (Remove Blue Hyperlink)   Patient will have improved function and activity level as evidenced by an increase in FOTO score to predicted score. Baseline: 47/51 Goal status: ONGOING    2.  Patient will improve 5 x STS to <11.4 sec to demonstrate LE strength that places him at a decreased risk for falls. Baseline: Unable to perform because needs to use UE support  Goal status: ONGOING    3. Patient will hold tandem stance for >=10 sec to exhibit improved static balance to decrease his risk of falling.   Baseline: Tandem stance R/L 3 sec/0 sec   Goal status: ONGOING    4.  Patient will improve his FGA score my 5 points to demonstrate a minimal significant improvement in his dynamic balance thus decreasing his risk for falling.  Baseline: 13/30 Goal status: ONGOING        PLAN: PT FREQUENCY: 1-2x/week   PT DURATION: 8 weeks   PLANNED INTERVENTIONS: Therapeutic exercises, Therapeutic activity, Neuromuscular re-education, Balance training, Gait training, Patient/Family education, Joint manipulation, Joint mobilization, Stair training, Orthotic/Fit training, DME instructions, Aquatic Therapy, Dry Needling, Cryotherapy, Moist heat, Manual therapy, and Re-evaluation   PLAN FOR NEXT SESSION: Progress LE strengthening and static balance exercises, finish measuring hip strength, ROM, and flexibility measurements,  Modified [place], Prothesis Prescription    Bradly Chris PT, DPT 04/26/2022, 1:20 PM

## 2022-04-28 ENCOUNTER — Encounter: Payer: Medicare Other | Admitting: Physical Therapy

## 2022-05-04 ENCOUNTER — Encounter: Payer: Self-pay | Admitting: Physical Therapy

## 2022-05-04 ENCOUNTER — Ambulatory Visit: Payer: Medicare Other | Admitting: Physical Therapy

## 2022-05-04 DIAGNOSIS — R262 Difficulty in walking, not elsewhere classified: Secondary | ICD-10-CM | POA: Diagnosis not present

## 2022-05-04 DIAGNOSIS — I6930 Unspecified sequelae of cerebral infarction: Secondary | ICD-10-CM

## 2022-05-04 NOTE — Therapy (Signed)
OUTPATIENT PHYSICAL THERAPY TREATMENT NOTE   Patient Name: Anthony Evans MRN: [id number] DOB:[birth year], 71 y.o., male Today's Date: 05/04/2022  PCP: Dr. Tracie Harrier  REFERRING PROVIDER: Dr. Allene Dillon   END OF SESSION:   PT End of Session - 05/04/22 1156     Visit Number 3    Number of Visits 16    Date for PT Re-Evaluation 06/17/22    Authorization Type [employer] Medicare    PT Start Time 1150    PT Stop Time 1230    PT Time Calculation (min) 40 min    Equipment Utilized During Treatment Gait belt    Activity Tolerance Patient tolerated treatment well    Behavior During Therapy WFL for tasks assessed/performed             Past Medical History:  Diagnosis Date   Allergy    Arthritis    psoriatic   Back pain    Cataract    Degenerative disc disease    Diverticulitis    GERD (gastroesophageal reflux disease)    Hypertension    Hypothyroidism    Psoriasis    Reflux    Seizures ([place])    last seizure about 10 years ago   Stroke [facility])    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   Thalassemia    Thyroid disease    Past Surgical History:  Procedure Laterality Date   BACK SURGERY     screws and plate in c4 and c5   CATARACT EXTRACTION W/PHACO Left 05/15/2015   Procedure: CATARACT EXTRACTION PHACO AND INTRAOCULAR LENS PLACEMENT ([place]);  Surgeon: Lyla Glassing, MD;  Location: [facility] ORS;  Service: Ophthalmology;  Laterality: Left;  [place]: 2:40.9 AP: 17.3 CDE: 27.88   CERVICAL FUSION     COLONOSCOPY WITH PROPOFOL N/A 06/14/2016   Procedure: COLONOSCOPY WITH PROPOFOL;  Surgeon: Manya Silvas, MD;  Location: [facility] ENDOSCOPY;  Service: Endoscopy;  Laterality: N/A;   COLONOSCOPY WITH PROPOFOL N/A 03/15/2022   Procedure: COLONOSCOPY WITH PROPOFOL;  Surgeon: [name], DO;  Location: [facility] ENDOSCOPY;  Service: Gastroenterology;  Laterality: N/A;   CORRECTION HAMMER TOE     ESOPHAGOGASTRODUODENOSCOPY (EGD) WITH PROPOFOL N/A 03/15/2022   Procedure: ESOPHAGOGASTRODUODENOSCOPY (EGD)  WITH PROPOFOL;  Surgeon: [name], DO;  Location: [facility] ENDOSCOPY;  Service: Gastroenterology;  Laterality: N/A;   EYE SURGERY Right    cataract   FRACTURE SURGERY     HIP FRACTURE SURGERY Right    pins in hip   JOINT REPLACEMENT     KNEE ARTHROPLASTY Left    total knee replacement   Patient Active Problem List   Diagnosis Date Noted   Syncope 11/05/2019   C2 cervical fracture ([place]) 09/14/2018   Hypertension 03/08/2018   GERD (gastroesophageal reflux disease) 03/08/2018   Thyroid disease 03/08/2018   Degenerative disc disease 03/08/2018   Psoriatic arthritis ([place]) 03/08/2018   Back pain 03/08/2018   Thalassemia minor 03/08/2018   Seizures ([place]) 03/08/2018   H/O cataract 03/08/2018   Seasonal allergies 03/08/2018    REFERRING DIAG: Imbalance   THERAPY DIAG:  Difficulty in walking, not elsewhere classified  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 Ladona Ridgel    HPI The patient is a 71 year old gentleman followed by Dr. Joya Salm at [facility] and presents today for follow-up of low back pain with radiation into the left anterior thigh consistent with an L3 radiculitis. His symptoms began around the fall  2016 without preceding injury or trauma. His symptoms are worsened with heavy lifting, bending, twisting, and occasionally with standing. He has some relief with sitting and rest. Chiropractic manipulation with Dr. Laveda Abbe often provides moderate to good relief. Medications have included Mobic 7.5 mg twice daily as needed, Flexeril 5 mg as needed, and an occasional hydrocodone. He prefers to avoid narcotics if possible.   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 He is now on permanent disability.    He was last evaluated on 06/27/2020 at which time he was experiencing left low back pain rating to the left anterior thigh consistent with  an L3 radiculitis for which he was scheduled for another epidural steroid injection. He was to continue with meloxicam and referred for balance therapy at [facility].       At today's visit ports that he has experienced a flare of pain radiating from the left low back into the left anterior thigh stopping at the knee over the last several months and is hopeful to schedule for another epidural steroid injection. He continues meloxicam. He also continues to experience some imbalance and states that he never heard from [facility] for balance therapy I will refer him today. On exam today he is without any noted weakness in the left lower extremity.   PRECAUTIONS: FALLS  SUBJECTIVE: Patient reports increased stiffness in his right knee at the beginning of the session.   PAIN:  Are you having pain? Yes: NPRS scale: 1-2/10 Pain location: low back pain that starts in left side of low back and radiates down the anterior surface of his left leg Pain description: Achy  Aggravating factors: Walking and lifting objects  Relieving factors: Sitting or resting    OBJECTIVE:   VITALS: BP 134/88 SpO2 97 HR 106      DIAGNOSTIC FINDINGS: CLINICAL DATA:  71 year old male status post fall backwards with subsequent weakness.   EXAM: MRI LUMBAR SPINE WITHOUT CONTRAST   TECHNIQUE: Multiplanar, multisequence MR imaging of the lumbar spine was performed. No intravenous contrast was administered.   COMPARISON:  Lumbar MRI 05/17/2019. Lumbar spine CT 10/10/2017.   FINDINGS: Segmentation: Transitional anatomy. For the purposes of this report hypoplastic or absent ribs are designated at T12 (full size ribs at T11) resulting in the lowest open disc space at L5-S1, and this is the same numbering system used on the June MRI. Correlation with radiographs is recommended prior to any operative intervention.   Alignment: Stable since June. Moderate chronic levoconvex lumbar scoliosis with superimposed mild grade 1  anterolisthesis of L4 on L5.   Vertebrae: Chronic T12 compression fracture is stable with mild-to-moderate loss of vertebral height. Superimposed mild left L1 superior endplate compression or large Schmorl's node. Additional chronic endplate changes in the lower lumbar spine. No marrow edema or evidence of acute osseous abnormality. Normal background bone marrow signal. Stable and benign appearing chronic cystic appearing bone lesion in the medial right iliac on series 14, image 36. Intact visible sacrum and SI joints.   Conus medullaris and cauda equina: Conus extends to the T12-L1 level. No lower spinal cord or conus signal abnormality.   Paraspinal and other soft tissues: Stable since June.   Disc levels:   L1-L2 with no stable visible lower thoracic spine through spinal stenosis. There is multilevel chronic moderate and severe lower thoracic and upper lumbar neural foraminal stenosis greater on the right.   L2-L3: Moderate multifactorial spinal stenosis with severe right lateral recess stenosis is stable  related to rightward disc osteophyte complex and posterior element hypertrophy. Mild left and severe right L2 foraminal stenosis is stable.   L3-L4: Moderate to severe multifactorial spinal and right lateral recess stenosis is stable related to circumferential disc osteophyte complex and moderate to severe posterior element hypertrophy. Moderate to severe left and moderate right L3 foraminal stenosis is stable.   L4-L5: Moderate to severe chronic spinal and left greater than right lateral recess stenosis appears stable in the setting of grade 1 anterolisthesis. A small subligamentous synovial cyst has developed on the left (series 11, image 27), although does not exacerbate the stenosis at this time. Moderate to severe left and mild right L4 foraminal stenosis is stable.   L5-S1: Left side assimilation joint with left far lateral disc osteophyte complex and moderate  posterior element hypertrophy again noted. No spinal or lateral recess stenosis. Moderate to severe left and mild right L5 foraminal stenosis is stable.   IMPRESSION: 1. Stable MRI appearance of the lumbar spine since June. 2. Transitional anatomy. Correlation with radiographs is recommended prior to any operative intervention. 3. Chronic levoconvex lumbar scoliosis and grade 1 anterolisthesis at L4-L5 with widespread advanced disc, endplate, and posterior element degeneration. 4. Moderate to severe multifactorial spinal and lateral recess stenosis is stable from L2-L3 through L4-L5. 5. Widespread lower thoracic and lumbar moderate to severe neural foraminal stenosis is stable.     Electronically Signed   By: Genevie Ann M.D.   On: 11/05/2019 02:28   PATIENT SURVEYS:  FOTO 47/51   COGNITION:           Overall cognitive status: Within functional limits for tasks assessed                          SENSATION: Light touch: Impaired on RLE and RUE    MUSCLE LENGTH: Deferred to next visit  Hamstrings: Right NT deg; Left NT deg Marcello Moores test: Right NT deg; Left NT deg   POSTURE:  Left sided trunk  lean    PALPATION: N/a   LE ROM:     Active  Right 03/29/2022 Left 03/29/2022  Hip flexion 120 120  Hip extension 30 30  Hip abduction 45 45  Hip adduction 30 30  Hip internal rotation      Hip external rotation      Knee flexion 135 135  Knee extension 0 0  Ankle dorsiflexion 5 20  Ankle plantarflexion 50 50  Ankle inversion 35 35  Ankle eversion 15 15   (Blank rows = not tested)      LE MMT:   MMT Right 04/23/2022 Left 04/23/2022  Hip flexion 5 5  Hip extension      Hip abduction      Hip adduction      Hip internal rotation      Hip external rotation      Knee flexion 5 5  Knee extension 5 5  Ankle dorsiflexion 2+ 5  Ankle plantarflexion 5 5  Ankle inversion      Ankle eversion       (Blank rows = not tested)      LOWER EXTREMITY SPECIAL TESTS: NT      FUNCTIONAL TESTS:    5 Times Sit to Stand: 0 reps  (Individuals with times that exceed the listed time have worse than average performance - predictive of recurrent falls in healthy community-living subjects)         - 60-69 y.o.  11.4 sec         - 70-79 y.o. 12.6 sec         - 80-89 y.o. 14.8 sec                 30 sec chair stands: 0 reps   <12 reps for Males 70-74   *A below average score indicates a risk for falls   STEADI 4-Stage Balance Test: (inability to maintain tandem stance for 10 sec = increased risk for falls) - feet together, eyes open, noncompliant surface: 10/10 sec - semi-tandem stance, eyes open, noncompliant surface: 10/10 sec - tandem stances, eyes open, noncompliant surface: 3/ 0 sec - single leg stance: NT        GAIT: Distance walked: Right sided hemiplegia, decreased stance time on RLE and decreased step length on LLE  Assistive device utilized: None Level of assistance: Complete Independence Comments: H/o of left sided CVA as a child        TODAY'S TREATMENT: 05/04/22 Nu-Step seat level 10 for 6 min   Prone Quad Stretch 3 x 30 sec  -Provided pt with a gray TB   Sit to Stand 3 x 10 with 1 UE support -Pt shows increased shortness of breath   Discussed using AFO for right sided foot drop. Also, to increase aerobic exercise with Nu-Step at [employer].     04/26/22:  Nu-Step Level 2 with seat at 10      FGA  -Item 1 Gait Level Surface: Mild Impairment 2 -Item 2 Change in Gait Speed: Mild Impairment 2 -Item 3 Gait with Horizontal Head Turns: Mild Impairment 2 -Item 4 Gait with Vertical Head Turns: Mild Impairment 2 -Item 5 Gait with Pivot Turn: Mild Impairment 2 -Item 6 Step Over Obstacle: Severe Impairment  0 -Item 7 Gait with Narrow Base of Support: Severe Impairment 0 -Item 8 Gait with Eyes Closed: Severe Impairment 0            -Item 9 Ambulating Backwards: Severe Impairment 0 -Item 10 Steps: Moderate Impairment 1 Total: 13/30            * Score of <=22/30 indicates that patient is at increased risk for falls.              Sit to Stand                Initial Eval:  Sit to Stand with 1 UE support 1 x 5 Romberg 1 x 5 for 10 sec hold  -min VC to maintain upright posture      PATIENT EDUCATION:  Education details: Form and technique for appropriate exercise  Person educated: Patient Education method: Explanation Education comprehension: verbalized understanding, returned demonstration, and verbal cues required     HOME EXERCISE PROGRAM: Access Code: [email or web address] URL: [email or web address] Date: 05/04/2022 Prepared by: Bradly Chris  Exercises - Sit to Stand with Counter Support  - 1 x daily - 3 x weekly - 3 sets - 5 reps - Romberg Stance  - 1 x daily - 7 x weekly - 1 sets - 5 reps - 10 hold - Seated Sciatic Tensioner  - 1 x daily - 7 x weekly - 1 sets - 10 reps - Prone Quadriceps Stretch with Strap  - 1 x daily - 7 x weekly - 1 sets - 3 reps - 30 hold  ASSESSMENT:   CLINICAL IMPRESSION:  Pt continues to exhibit decreased aerobic endurance as evidenced by shortness of breath with increased activity.  Discussed use of right AFO to decrease foot drop and eventual use of rollator to increase steadiness with gait and prolong walking bouts. Pt continues to exhibit difficulty with walking requiring intermittent use of objects and UE support for steadiness. He will continue to benefit from skilled PT to increase LE strength and endurance, static and dynamic balance, and improve toe clearance with an orthotic prescription to avoid increased right toe drag and increased risk for falling.    OBJECTIVE IMPAIRMENTS Abnormal gait, decreased balance, difficulty walking, decreased ROM, decreased strength, impaired flexibility, impaired sensation, and pain.    ACTIVITY LIMITATIONS shopping and community activity.    PERSONAL FACTORS Fitness, Past/current experiences, Time since onset of  injury/illness/exacerbation, and 3+ comorbidities: h/o stroke, psoriatic arthritis, degenerative disc disease  are also affecting patient's functional outcome.      REHAB POTENTIAL: Fair chronicity of right hemiparesis and reduced familial and social support    CLINICAL DECISION MAKING: Stable/uncomplicated   EVALUATION COMPLEXITY: Low     GOALS: Goals reviewed with patient? No   SHORT TERM GOALS: Target date: 05/07/2022  (Remove Blue Hyperlink)   Pt will be independent with HEP in order to improve strength and balance in order to decrease fall risk and improve function at home and work. Baseline: N/a Goal status: ONGOING    2.  Patient will perform sit to stand without use of UE support to demonstrate improve LE strength.  Baseline: Unable to perform without use of hands  Goal status: ONGOING        LONG TERM GOALS: Target date: 06/18/2022  (Remove Blue Hyperlink)   Patient will have improved function and activity level as evidenced by an increase in FOTO score to predicted score. Baseline: 47/51 Goal status: ONGOING    2.  Patient will improve 5 x STS to <11.4 sec to demonstrate LE strength that places him at a decreased risk for falls. Baseline: Unable to perform because needs to use UE support  Goal status: ONGOING    3. Patient will hold tandem stance for >=10 sec to exhibit improved static balance to decrease his risk of falling.   Baseline: Tandem stance R/L 3 sec/0 sec   Goal status: ONGOING    4.  Patient will improve his FGA score my 5 points to demonstrate a minimal significant improvement in his dynamic balance thus decreasing his risk for falling.  Baseline: 13/30 Goal status: ONGOING        PLAN: PT FREQUENCY: 1-2x/week   PT DURATION: 8 weeks   PLANNED INTERVENTIONS: Therapeutic exercises, Therapeutic activity, Neuromuscular re-education, Balance training, Gait training, Patient/Family education, Joint manipulation, Joint mobilization, Stair training,  Orthotic/Fit training, DME instructions, Aquatic Therapy, Dry Needling, Cryotherapy, Moist heat, Manual therapy, and Re-evaluation   PLAN FOR NEXT SESSION: Dynamic balance and walking sessions. Progress LE strength and static balance exercises.    Bradly Chris PT, DPT 05/04/2022, 11:57 AM

## 2022-05-06 ENCOUNTER — Ambulatory Visit: Payer: Medicare Other | Attending: Family Medicine | Admitting: Physical Therapy

## 2022-05-06 ENCOUNTER — Encounter: Payer: Self-pay | Admitting: Physical Therapy

## 2022-05-06 DIAGNOSIS — I6930 Unspecified sequelae of cerebral infarction: Secondary | ICD-10-CM | POA: Insufficient documentation

## 2022-05-06 DIAGNOSIS — R262 Difficulty in walking, not elsewhere classified: Secondary | ICD-10-CM | POA: Insufficient documentation

## 2022-05-06 NOTE — Therapy (Signed)
OUTPATIENT PHYSICAL THERAPY TREATMENT NOTE   Patient Name: Anthony Evans MRN: [id number] DOB:02/10/1951, 71 y.o., male Today's Date: 05/06/2022  PCP: Dr. Tracie Harrier  REFERRING PROVIDER: Dr. Allene Dillon   END OF SESSION:   PT End of Session - 05/06/22 1108     Visit Number 4    Number of Visits 16    Date for PT Re-Evaluation 06/17/22    Authorization Type [employer] Medicare    PT Start Time 1105    PT Stop Time 1145    PT Time Calculation (min) 40 min    Equipment Utilized During Treatment Gait belt    Activity Tolerance Patient tolerated treatment well    Behavior During Therapy WFL for tasks assessed/performed             Past Medical History:  Diagnosis Date   Allergy    Arthritis    psoriatic   Back pain    Cataract    Degenerative disc disease    Diverticulitis    GERD (gastroesophageal reflux disease)    Hypertension    Hypothyroidism    Psoriasis    Reflux    Seizures ([place])    last seizure about 10 years ago   Stroke [facility])    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   Thalassemia    Thyroid disease    Past Surgical History:  Procedure Laterality Date   BACK SURGERY     screws and plate in c4 and c5   CATARACT EXTRACTION W/PHACO Left 05/15/2015   Procedure: CATARACT EXTRACTION PHACO AND INTRAOCULAR LENS PLACEMENT ([place]);  Surgeon: Lyla Glassing, MD;  Location: [facility] ORS;  Service: Ophthalmology;  Laterality: Left;  [place]: 2:40.9 AP: 17.3 CDE: 27.88   CERVICAL FUSION     COLONOSCOPY WITH PROPOFOL N/A 06/14/2016   Procedure: COLONOSCOPY WITH PROPOFOL;  Surgeon: Manya Silvas, MD;  Location: [facility] ENDOSCOPY;  Service: Endoscopy;  Laterality: N/A;   COLONOSCOPY WITH PROPOFOL N/A 03/15/2022   Procedure: COLONOSCOPY WITH PROPOFOL;  Surgeon: [name], DO;  Location: [facility] ENDOSCOPY;  Service: Gastroenterology;  Laterality: N/A;   CORRECTION HAMMER TOE     ESOPHAGOGASTRODUODENOSCOPY (EGD) WITH PROPOFOL N/A 03/15/2022   Procedure: ESOPHAGOGASTRODUODENOSCOPY (EGD)  WITH PROPOFOL;  Surgeon: [name], DO;  Location: [facility] ENDOSCOPY;  Service: Gastroenterology;  Laterality: N/A;   EYE SURGERY Right    cataract   FRACTURE SURGERY     HIP FRACTURE SURGERY Right    pins in hip   JOINT REPLACEMENT     KNEE ARTHROPLASTY Left    total knee replacement   Patient Active Problem List   Diagnosis Date Noted   Syncope 11/05/2019   C2 cervical fracture ([place]) 09/14/2018   Hypertension 03/08/2018   GERD (gastroesophageal reflux disease) 03/08/2018   Thyroid disease 03/08/2018   Degenerative disc disease 03/08/2018   Psoriatic arthritis ([place]) 03/08/2018   Back pain 03/08/2018   Thalassemia minor 03/08/2018   Seizures ([place]) 03/08/2018   H/O cataract 03/08/2018   Seasonal allergies 03/08/2018    REFERRING DIAG: Imbalance   THERAPY DIAG:  Difficulty in walking, not elsewhere classified  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 Ladona Ridgel    HPI The patient is a 71 year old gentleman followed by Dr. Joya Salm at [facility] and presents today for follow-up of low back pain with radiation into the left anterior thigh consistent with an L3 radiculitis. His symptoms began around the fall  2016 without preceding injury or trauma. His symptoms are worsened with heavy lifting, bending, twisting, and occasionally with standing. He has some relief with sitting and rest. Chiropractic manipulation with Dr. Laveda Abbe often provides moderate to good relief. Medications have included Mobic 7.5 mg twice daily as needed, Flexeril 5 mg as needed, and an occasional hydrocodone. He prefers to avoid narcotics if possible.   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 He is now on permanent disability.    He was last evaluated on 06/27/2020 at which time he was experiencing left low back pain rating to the left anterior thigh consistent with  an L3 radiculitis for which he was scheduled for another epidural steroid injection. He was to continue with meloxicam and referred for balance therapy at [facility].       At today's visit ports that he has experienced a flare of pain radiating from the left low back into the left anterior thigh stopping at the knee over the last several months and is hopeful to schedule for another epidural steroid injection. He continues meloxicam. He also continues to experience some imbalance and states that he never heard from [facility] for balance therapy I will refer him today. On exam today he is without any noted weakness in the left lower extremity.   PRECAUTIONS: FALLS  SUBJECTIVE: Patient does not feel increased pain since last session. He is still struggling with prone quad stretch.   PAIN:  Are you having pain? Yes: NPRS scale: 1-2/10 Pain location: low back pain that starts in left side of low back and radiates down the anterior surface of his left leg Pain description: Achy  Aggravating factors: Walking and lifting objects  Relieving factors: Sitting or resting    OBJECTIVE:   VITALS: BP 134/88 SpO2 97 HR 106      DIAGNOSTIC FINDINGS: CLINICAL DATA:  71 year old male status post fall backwards with subsequent weakness.   EXAM: MRI LUMBAR SPINE WITHOUT CONTRAST   TECHNIQUE: Multiplanar, multisequence MR imaging of the lumbar spine was performed. No intravenous contrast was administered.   COMPARISON:  Lumbar MRI 05/17/2019. Lumbar spine CT 10/10/2017.   FINDINGS: Segmentation: Transitional anatomy. For the purposes of this report hypoplastic or absent ribs are designated at T12 (full size ribs at T11) resulting in the lowest open disc space at L5-S1, and this is the same numbering system used on the June MRI. Correlation with radiographs is recommended prior to any operative intervention.   Alignment: Stable since June. Moderate chronic levoconvex lumbar scoliosis with superimposed  mild grade 1 anterolisthesis of L4 on L5.   Vertebrae: Chronic T12 compression fracture is stable with mild-to-moderate loss of vertebral height. Superimposed mild left L1 superior endplate compression or large Schmorl's node. Additional chronic endplate changes in the lower lumbar spine. No marrow edema or evidence of acute osseous abnormality. Normal background bone marrow signal. Stable and benign appearing chronic cystic appearing bone lesion in the medial right iliac on series 14, image 36. Intact visible sacrum and SI joints.   Conus medullaris and cauda equina: Conus extends to the T12-L1 level. No lower spinal cord or conus signal abnormality.   Paraspinal and other soft tissues: Stable since June.   Disc levels:   L1-L2 with no stable visible lower thoracic spine through spinal stenosis. There is multilevel chronic moderate and severe lower thoracic and upper lumbar neural foraminal stenosis greater on the right.   L2-L3: Moderate multifactorial spinal stenosis with severe right lateral recess  stenosis is stable related to rightward disc osteophyte complex and posterior element hypertrophy. Mild left and severe right L2 foraminal stenosis is stable.   L3-L4: Moderate to severe multifactorial spinal and right lateral recess stenosis is stable related to circumferential disc osteophyte complex and moderate to severe posterior element hypertrophy. Moderate to severe left and moderate right L3 foraminal stenosis is stable.   L4-L5: Moderate to severe chronic spinal and left greater than right lateral recess stenosis appears stable in the setting of grade 1 anterolisthesis. A small subligamentous synovial cyst has developed on the left (series 11, image 27), although does not exacerbate the stenosis at this time. Moderate to severe left and mild right L4 foraminal stenosis is stable.   L5-S1: Left side assimilation joint with left far lateral disc osteophyte complex and  moderate posterior element hypertrophy again noted. No spinal or lateral recess stenosis. Moderate to severe left and mild right L5 foraminal stenosis is stable.   IMPRESSION: 1. Stable MRI appearance of the lumbar spine since June. 2. Transitional anatomy. Correlation with radiographs is recommended prior to any operative intervention. 3. Chronic levoconvex lumbar scoliosis and grade 1 anterolisthesis at L4-L5 with widespread advanced disc, endplate, and posterior element degeneration. 4. Moderate to severe multifactorial spinal and lateral recess stenosis is stable from L2-L3 through L4-L5. 5. Widespread lower thoracic and lumbar moderate to severe neural foraminal stenosis is stable.     Electronically Signed   By: Genevie Ann M.D.   On: 11/05/2019 02:28   PATIENT SURVEYS:  FOTO 47/51   COGNITION:           Overall cognitive status: Within functional limits for tasks assessed                          SENSATION: Light touch: Impaired on RLE and RUE    MUSCLE LENGTH: Deferred to next visit  Hamstrings: Right NT deg; Left NT deg Marcello Moores test: Right NT deg; Left NT deg   POSTURE:  Left sided trunk  lean    PALPATION: N/a   LE ROM:     Active  Right 03/29/2022 Left 03/29/2022  Hip flexion 120 120  Hip extension 30 30  Hip abduction 45 45  Hip adduction 30 30  Hip internal rotation      Hip external rotation      Knee flexion 135 135  Knee extension 0 0  Ankle dorsiflexion 5 20  Ankle plantarflexion 50 50  Ankle inversion 35 35  Ankle eversion 15 15   (Blank rows = not tested)      LE MMT:   MMT Right 04/23/2022 Left 04/23/2022  Hip flexion 5 5  Hip extension      Hip abduction      Hip adduction      Hip internal rotation      Hip external rotation      Knee flexion 5 5  Knee extension 5 5  Ankle dorsiflexion 2+ 5  Ankle plantarflexion 5 5  Ankle inversion      Ankle eversion       (Blank rows = not tested)      LOWER EXTREMITY SPECIAL TESTS:  NT     FUNCTIONAL TESTS:    5 Times Sit to Stand: 0 reps  (Individuals with times that exceed the listed time have worse than average performance - predictive of recurrent falls in healthy community-living subjects)         -  20-69 y.o. 11.4 sec         - 70-79 y.o. 12.6 sec         - 80-89 y.o. 14.8 sec                 30 sec chair stands: 0 reps   <12 reps for Males 70-74   *A below average score indicates a risk for falls   STEADI 4-Stage Balance Test: (inability to maintain tandem stance for 10 sec = increased risk for falls) - feet together, eyes open, noncompliant surface: 10/10 sec - semi-tandem stance, eyes open, noncompliant surface: 10/10 sec - tandem stances, eyes open, noncompliant surface: 3/ 0 sec - single leg stance: NT        GAIT: Distance walked: Right sided hemiplegia, decreased stance time on RLE and decreased step length on LLE  Assistive device utilized: None Level of assistance: Complete Independence Comments: H/o of left sided CVA as a child        TODAY'S TREATMENT: 05/06/22  THEREX  Nu-Step 6 min with seat level 10   Trial rollator 10 m x 5  -How to lock and unlock brakes  -How to sit safely on a rollator by backing into immovable object   NMR   10 m HOR head turns x 10 10 m VERT head turns x 10  10 m VERT and HOR head turns combined x 10  10 m Fast and Slow  x 10  10 m Fast+Slow + Vert + Horz x 10   05/04/22 Nu-Step seat level 10 for 6 min   Prone Quad Stretch 3 x 30 sec  -Provided pt with a gray TB   Sit to Stand 3 x 10 with 1 UE support -Pt shows increased shortness of breath   Discussed using AFO for right sided foot drop. Also, to increase aerobic exercise with Nu-Step at [employer].     04/26/22:  Nu-Step Level 2 with seat at 10      FGA  -Item 1 Gait Level Surface: Mild Impairment 2 -Item 2 Change in Gait Speed: Mild Impairment 2 -Item 3 Gait with Horizontal Head Turns: Mild Impairment 2 -Item 4 Gait with  Vertical Head Turns: Mild Impairment 2 -Item 5 Gait with Pivot Turn: Mild Impairment 2 -Item 6 Step Over Obstacle: Severe Impairment  0 -Item 7 Gait with Narrow Base of Support: Severe Impairment 0 -Item 8 Gait with Eyes Closed: Severe Impairment 0            -Item 9 Ambulating Backwards: Severe Impairment 0 -Item 10 Steps: Moderate Impairment 1 Total: 13/30           * Score of <=22/30 indicates that patient is at increased risk for falls.              Sit to Stand                Initial Eval:  Sit to Stand with 1 UE support 1 x 5 Romberg 1 x 5 for 10 sec hold  -min VC to maintain upright posture      PATIENT EDUCATION:  Education details: Form and technique for appropriate exercise  Person educated: Patient Education method: Explanation Education comprehension: verbalized understanding, returned demonstration, and verbal cues required     HOME EXERCISE PROGRAM: Access Code: [email or web address] URL: [email or web address] Date: 05/04/2022 Prepared by: Bradly Chris  Exercises - Sit to Stand with Counter Support  - 1 x daily - 3 x weekly -  3 sets - 5 reps - Romberg Stance  - 1 x daily - 7 x weekly - 1 sets - 5 reps - 10 hold - Seated Sciatic Tensioner  - 1 x daily - 7 x weekly - 1 sets - 10 reps - Prone Quadriceps Stretch with Strap  - 1 x daily - 7 x weekly - 1 sets - 3 reps - 30 hold  ASSESSMENT:   CLINICAL IMPRESSION: Pt exhibits decreased aerobic endurance with need for increased rest break. He prefers using SPC instead of rollator to maintain increased mobility. He was able to perform all dynamic balance exercises with SPC with minor lateral sway. PT instructed pt to continue to use SPC while ambulating for increased safety. He will continue to benefit from skilled PT to increase LE strength and endurance, static and dynamic balance, and improve toe clearance with an orthotic prescription to avoid increased right toe drag and increased risk for falling.      OBJECTIVE IMPAIRMENTS Abnormal gait, decreased balance, difficulty walking, decreased ROM, decreased strength, impaired flexibility, impaired sensation, and pain.    ACTIVITY LIMITATIONS shopping and community activity.    PERSONAL FACTORS Fitness, Past/current experiences, Time since onset of injury/illness/exacerbation, and 3+ comorbidities: h/o stroke, psoriatic arthritis, degenerative disc disease  are also affecting patient's functional outcome.      REHAB POTENTIAL: Fair chronicity of right hemiparesis and reduced familial and social support    CLINICAL DECISION MAKING: Stable/uncomplicated   EVALUATION COMPLEXITY: Low     GOALS: Goals reviewed with patient? No   SHORT TERM GOALS: Target date: 05/07/2022  (Remove Blue Hyperlink)   Pt will be independent with HEP in order to improve strength and balance in order to decrease fall risk and improve function at home and work. Baseline: N/a Goal status: ONGOING    2.  Patient will perform sit to stand without use of UE support to demonstrate improve LE strength.  Baseline: Unable to perform without use of hands  Goal status: ONGOING        LONG TERM GOALS: Target date: 06/18/2022  (Remove Blue Hyperlink)   Patient will have improved function and activity level as evidenced by an increase in FOTO score to predicted score. Baseline: 47/51 Goal status: ONGOING    2.  Patient will improve 5 x STS to <11.4 sec to demonstrate LE strength that places him at a decreased risk for falls. Baseline: Unable to perform because needs to use UE support  Goal status: ONGOING    3. Patient will hold tandem stance for >=10 sec to exhibit improved static balance to decrease his risk of falling.   Baseline: Tandem stance R/L 3 sec/0 sec   Goal status: ONGOING    4.  Patient will improve his FGA score my 5 points to demonstrate a minimal significant improvement in his dynamic balance thus decreasing his risk for falling.  Baseline: 13/30 Goal  status: ONGOING        PLAN: PT FREQUENCY: 1-2x/week   PT DURATION: 8 weeks   PLANNED INTERVENTIONS: Therapeutic exercises, Therapeutic activity, Neuromuscular re-education, Balance training, Gait training, Patient/Family education, Joint manipulation, Joint mobilization, Stair training, Orthotic/Fit training, DME instructions, Aquatic Therapy, Dry Needling, Cryotherapy, Moist heat, Manual therapy, and Re-evaluation   PLAN FOR NEXT SESSION: Dynamic balance and walking sessions. Progress LE strength and static balance exercises.    Bradly Chris PT, DPT 05/06/2022, 11:09 AM

## 2022-05-12 ENCOUNTER — Ambulatory Visit: Payer: Medicare Other | Admitting: Physical Therapy

## 2022-05-17 ENCOUNTER — Ambulatory Visit: Payer: Medicare Other | Admitting: Physical Therapy

## 2022-05-17 ENCOUNTER — Encounter: Payer: Self-pay | Admitting: Physical Therapy

## 2022-05-17 DIAGNOSIS — R262 Difficulty in walking, not elsewhere classified: Secondary | ICD-10-CM | POA: Diagnosis not present

## 2022-05-17 DIAGNOSIS — I6930 Unspecified sequelae of cerebral infarction: Secondary | ICD-10-CM

## 2022-05-17 NOTE — Therapy (Signed)
OUTPATIENT PHYSICAL THERAPY TREATMENT NOTE   Patient Name: Anthony Evans MRN: [id number] DOB:1951-01-11, 71 y.o., male Today's Date: 05/17/2022  PCP: Dr. Tracie Harrier  REFERRING PROVIDER: Dr. Allene Dillon   END OF SESSION:   PT End of Session - 05/17/22 1637     Visit Number 5    Number of Visits 16    Date for PT Re-Evaluation 06/17/22    Authorization Type [employer] Medicare    PT Start Time 1635    PT Stop Time 1715    PT Time Calculation (min) 40 min    Equipment Utilized During Treatment Gait belt    Activity Tolerance Patient tolerated treatment well    Behavior During Therapy WFL for tasks assessed/performed             Past Medical History:  Diagnosis Date   Allergy    Arthritis    psoriatic   Back pain    Cataract    Degenerative disc disease    Diverticulitis    GERD (gastroesophageal reflux disease)    Hypertension    Hypothyroidism    Psoriasis    Reflux    Seizures ([place])    last seizure about 10 years ago   Stroke [facility])    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   Thalassemia    Thyroid disease    Past Surgical History:  Procedure Laterality Date   BACK SURGERY     screws and plate in c4 and c5   CATARACT EXTRACTION W/PHACO Left 05/15/2015   Procedure: CATARACT EXTRACTION PHACO AND INTRAOCULAR LENS PLACEMENT ([place]);  Surgeon: Lyla Glassing, MD;  Location: [facility] ORS;  Service: Ophthalmology;  Laterality: Left;  [place]: 2:40.9 AP: 17.3 CDE: 27.88   CERVICAL FUSION     COLONOSCOPY WITH PROPOFOL N/A 06/14/2016   Procedure: COLONOSCOPY WITH PROPOFOL;  Surgeon: Manya Silvas, MD;  Location: [facility] ENDOSCOPY;  Service: Endoscopy;  Laterality: N/A;   COLONOSCOPY WITH PROPOFOL N/A 03/15/2022   Procedure: COLONOSCOPY WITH PROPOFOL;  Surgeon: [name], DO;  Location: [facility] ENDOSCOPY;  Service: Gastroenterology;  Laterality: N/A;   CORRECTION HAMMER TOE     ESOPHAGOGASTRODUODENOSCOPY (EGD) WITH PROPOFOL N/A 03/15/2022   Procedure: ESOPHAGOGASTRODUODENOSCOPY (EGD)  WITH PROPOFOL;  Surgeon: [name], DO;  Location: [facility] ENDOSCOPY;  Service: Gastroenterology;  Laterality: N/A;   EYE SURGERY Right    cataract   FRACTURE SURGERY     HIP FRACTURE SURGERY Right    pins in hip   JOINT REPLACEMENT     KNEE ARTHROPLASTY Left    total knee replacement   Patient Active Problem List   Diagnosis Date Noted   Syncope 11/05/2019   C2 cervical fracture ([place]) 09/14/2018   Hypertension 03/08/2018   GERD (gastroesophageal reflux disease) 03/08/2018   Thyroid disease 03/08/2018   Degenerative disc disease 03/08/2018   Psoriatic arthritis ([place]) 03/08/2018   Back pain 03/08/2018   Thalassemia minor 03/08/2018   Seizures ([place]) 03/08/2018   H/O cataract 03/08/2018   Seasonal allergies 03/08/2018    REFERRING DIAG: Imbalance   THERAPY DIAG:  Difficulty in walking, not elsewhere classified  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 Ladona Ridgel    HPI The patient is a 71 year old gentleman followed by Dr. Joya Salm at [facility] and presents today for follow-up of low back pain with radiation into the left anterior thigh consistent with an L3 radiculitis. His symptoms began around the fall  2016 without preceding injury or trauma. His symptoms are worsened with heavy lifting, bending, twisting, and occasionally with standing. He has some relief with sitting and rest. Chiropractic manipulation with Dr. Laveda Abbe often provides moderate to good relief. Medications have included Mobic 7.5 mg twice daily as needed, Flexeril 5 mg as needed, and an occasional hydrocodone. He prefers to avoid narcotics if possible.   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 He is now on permanent disability.    He was last evaluated on 06/27/2020 at which time he was experiencing left low back pain rating to the left anterior thigh consistent with  an L3 radiculitis for which he was scheduled for another epidural steroid injection. He was to continue with meloxicam and referred for balance therapy at [facility].       At today's visit ports that he has experienced a flare of pain radiating from the left low back into the left anterior thigh stopping at the knee over the last several months and is hopeful to schedule for another epidural steroid injection. He continues meloxicam. He also continues to experience some imbalance and states that he never heard from [facility] for balance therapy I will refer him today. On exam today he is without any noted weakness in the left lower extremity.   PRECAUTIONS: FALLS  SUBJECTIVE: Patient reports having difficulty with standing balance exercises because of his back pain.   PAIN:  Are you having pain? Yes: NPRS scale: 1-2/10 Pain location: low back pain that starts in left side of low back and radiates down the anterior surface of his left leg Pain description: Achy  Aggravating factors: Walking and lifting objects  Relieving factors: Sitting or resting    OBJECTIVE:   VITALS: BP 134/88 SpO2 97 HR 106      DIAGNOSTIC FINDINGS: CLINICAL DATA:  71 year old male status post fall backwards with subsequent weakness.   EXAM: MRI LUMBAR SPINE WITHOUT CONTRAST   TECHNIQUE: Multiplanar, multisequence MR imaging of the lumbar spine was performed. No intravenous contrast was administered.   COMPARISON:  Lumbar MRI 05/17/2019. Lumbar spine CT 10/10/2017.   FINDINGS: Segmentation: Transitional anatomy. For the purposes of this report hypoplastic or absent ribs are designated at T12 (full size ribs at T11) resulting in the lowest open disc space at L5-S1, and this is the same numbering system used on the June MRI. Correlation with radiographs is recommended prior to any operative intervention.   Alignment: Stable since June. Moderate chronic levoconvex lumbar scoliosis with superimposed mild grade 1  anterolisthesis of L4 on L5.   Vertebrae: Chronic T12 compression fracture is stable with mild-to-moderate loss of vertebral height. Superimposed mild left L1 superior endplate compression or large Schmorl's node. Additional chronic endplate changes in the lower lumbar spine. No marrow edema or evidence of acute osseous abnormality. Normal background bone marrow signal. Stable and benign appearing chronic cystic appearing bone lesion in the medial right iliac on series 14, image 36. Intact visible sacrum and SI joints.   Conus medullaris and cauda equina: Conus extends to the T12-L1 level. No lower spinal cord or conus signal abnormality.   Paraspinal and other soft tissues: Stable since June.   Disc levels:   L1-L2 with no stable visible lower thoracic spine through spinal stenosis. There is multilevel chronic moderate and severe lower thoracic and upper lumbar neural foraminal stenosis greater on the right.   L2-L3: Moderate multifactorial spinal stenosis with severe right lateral recess stenosis is stable related  to rightward disc osteophyte complex and posterior element hypertrophy. Mild left and severe right L2 foraminal stenosis is stable.   L3-L4: Moderate to severe multifactorial spinal and right lateral recess stenosis is stable related to circumferential disc osteophyte complex and moderate to severe posterior element hypertrophy. Moderate to severe left and moderate right L3 foraminal stenosis is stable.   L4-L5: Moderate to severe chronic spinal and left greater than right lateral recess stenosis appears stable in the setting of grade 1 anterolisthesis. A small subligamentous synovial cyst has developed on the left (series 11, image 27), although does not exacerbate the stenosis at this time. Moderate to severe left and mild right L4 foraminal stenosis is stable.   L5-S1: Left side assimilation joint with left far lateral disc osteophyte complex and moderate  posterior element hypertrophy again noted. No spinal or lateral recess stenosis. Moderate to severe left and mild right L5 foraminal stenosis is stable.   IMPRESSION: 1. Stable MRI appearance of the lumbar spine since June. 2. Transitional anatomy. Correlation with radiographs is recommended prior to any operative intervention. 3. Chronic levoconvex lumbar scoliosis and grade 1 anterolisthesis at L4-L5 with widespread advanced disc, endplate, and posterior element degeneration. 4. Moderate to severe multifactorial spinal and lateral recess stenosis is stable from L2-L3 through L4-L5. 5. Widespread lower thoracic and lumbar moderate to severe neural foraminal stenosis is stable.     Electronically Signed   By: Genevie Ann M.D.   On: 11/05/2019 02:28   PATIENT SURVEYS:  FOTO 47/51   COGNITION:           Overall cognitive status: Within functional limits for tasks assessed                          SENSATION: Light touch: Impaired on RLE and RUE    MUSCLE LENGTH: Deferred to next visit  Hamstrings: Right NT deg; Left NT deg Marcello Moores test: Right NT deg; Left NT deg   POSTURE:  Left sided trunk  lean    PALPATION: N/a   LE ROM:     Active  Right 03/29/2022 Left 03/29/2022  Hip flexion 120 120  Hip extension 30 30  Hip abduction 45 45  Hip adduction 30 30  Hip internal rotation      Hip external rotation      Knee flexion 135 135  Knee extension 0 0  Ankle dorsiflexion 5 20  Ankle plantarflexion 50 50  Ankle inversion 35 35  Ankle eversion 15 15   (Blank rows = not tested)      LE MMT:   MMT Right 04/23/2022 Left 04/23/2022  Hip flexion 5 5  Hip extension      Hip abduction      Hip adduction      Hip internal rotation      Hip external rotation      Knee flexion 5 5  Knee extension 5 5  Ankle dorsiflexion 2+ 5  Ankle plantarflexion 5 5  Ankle inversion      Ankle eversion       (Blank rows = not tested)      LOWER EXTREMITY SPECIAL TESTS: NT      FUNCTIONAL TESTS:    5 Times Sit to Stand: 0 reps  (Individuals with times that exceed the listed time have worse than average performance - predictive of recurrent falls in healthy community-living subjects)         - 42-69 y.o. 11.4  sec         - 30-79 y.o. 12.6 sec         - 80-89 y.o. 14.8 sec                 30 sec chair stands: 0 reps   <12 reps for Males 70-74   *A below average score indicates a risk for falls   STEADI 4-Stage Balance Test: (inability to maintain tandem stance for 10 sec = increased risk for falls) - feet together, eyes open, noncompliant surface: 10/10 sec - semi-tandem stance, eyes open, noncompliant surface: 10/10 sec - tandem stances, eyes open, noncompliant surface: 3/ 0 sec - single leg stance: NT        GAIT: Distance walked: Right sided hemiplegia, decreased stance time on RLE and decreased step length on LLE  Assistive device utilized: None Level of assistance: Complete Independence Comments: H/o of left sided CVA as a child        TODAY'S TREATMENT: 05/17/22  Nu-Step Seat level 10 and arm length 10 for 5 min   Romberg 1 x 5 with 10 sec holds  Supine Bridges with red TB tied around legs 3 x 10  10 m x 4  vertical head turns  -Seated rest break  10 m x 4 horizontal head turns  -Seated rest break  10 m x 4 horizontal and vertical head turns   05/06/22  THEREX  Nu-Step 6 min with seat level 10   Trial rollator 10 m x 5  -How to lock and unlock brakes  -How to sit safely on a rollator by backing into immovable object   NMR   10 m HOR head turns x 10 10 m VERT head turns x 10  10 m VERT and HOR head turns combined x 10  10 m Fast and Slow  x 10  10 m Fast+Slow + Vert + Horz x 10   05/04/22 Nu-Step seat level 10 for 6 min   Prone Quad Stretch 3 x 30 sec  -Provided pt with a gray TB   Sit to Stand 3 x 10 with 1 UE support -Pt shows increased shortness of breath   Discussed using AFO for right sided foot drop. Also, to  increase aerobic exercise with Nu-Step at [employer].     04/26/22:  Nu-Step Level 2 with seat at 10      FGA  -Item 1 Gait Level Surface: Mild Impairment 2 -Item 2 Change in Gait Speed: Mild Impairment 2 -Item 3 Gait with Horizontal Head Turns: Mild Impairment 2 -Item 4 Gait with Vertical Head Turns: Mild Impairment 2 -Item 5 Gait with Pivot Turn: Mild Impairment 2 -Item 6 Step Over Obstacle: Severe Impairment  0 -Item 7 Gait with Narrow Base of Support: Severe Impairment 0 -Item 8 Gait with Eyes Closed: Severe Impairment 0            -Item 9 Ambulating Backwards: Severe Impairment 0 -Item 10 Steps: Moderate Impairment 1 Total: 13/30           * Score of <=22/30 indicates that patient is at increased risk for falls.              Sit to Stand                Initial Eval:  Sit to Stand with 1 UE support 1 x 5 Romberg 1 x 5 for 10 sec hold  -min VC to maintain upright posture  PATIENT EDUCATION:  Education details: Form and technique for appropriate exercise  Person educated: Patient Education method: Explanation Education comprehension: verbalized understanding, returned demonstration, and verbal cues required     HOME EXERCISE PROGRAM: Access Code: [email or web address] URL: [email or web address] Date: 05/17/2022 Prepared by: Bradly Chris  Exercises - Sit to Stand with Counter Support  - 1 x daily - 3 x weekly - 3 sets - 5 reps - Romberg Stance  - 1 x daily - 7 x weekly - 1 sets - 5 reps - 10 hold - Seated Sciatic Tensioner  - 1 x daily - 7 x weekly - 1 sets - 10 reps - Prone Quadriceps Stretch with Strap  - 1 x daily - 7 x weekly - 1 sets - 3 reps - 30 hold - Supine Bridge with Resistance Band  - 1 x daily - 3 x weekly - 3 sets - 10 reps ASSESSMENT:   CLINICAL IMPRESSION: Pt continues to be limited during sessions by fatigue and decreased aerobic capacity. Back pain limiting during standing balance exercises and fatigue limiting during dynamic balance  exercises. He did not use AD during session. He will continue to benefit from skilled PT to increase LE strength and endurance, static and dynamic balance, and improve toe clearance with an orthotic prescription to avoid increased right toe drag and increased risk for falling.     OBJECTIVE IMPAIRMENTS Abnormal gait, decreased balance, difficulty walking, decreased ROM, decreased strength, impaired flexibility, impaired sensation, and pain.    ACTIVITY LIMITATIONS shopping and community activity.    PERSONAL FACTORS Fitness, Past/current experiences, Time since onset of injury/illness/exacerbation, and 3+ comorbidities: h/o stroke, psoriatic arthritis, degenerative disc disease  are also affecting patient's functional outcome.      REHAB POTENTIAL: Fair chronicity of right hemiparesis and reduced familial and social support    CLINICAL DECISION MAKING: Stable/uncomplicated   EVALUATION COMPLEXITY: Low     GOALS: Goals reviewed with patient? No   SHORT TERM GOALS: Target date: 05/07/2022  (Remove Blue Hyperlink)   Pt will be independent with HEP in order to improve strength and balance in order to decrease fall risk and improve function at home and work. Baseline: N/a Goal status: ONGOING    2.  Patient will perform sit to stand without use of UE support to demonstrate improve LE strength.  Baseline: Unable to perform without use of hands  Goal status: ONGOING        LONG TERM GOALS: Target date: 06/18/2022  (Remove Blue Hyperlink)   Patient will have improved function and activity level as evidenced by an increase in FOTO score to predicted score. Baseline: 47/51 Goal status: ONGOING    2.  Patient will improve 5 x STS to <11.4 sec to demonstrate LE strength that places him at a decreased risk for falls. Baseline: Unable to perform because needs to use UE support  Goal status: ONGOING    3. Patient will hold tandem stance for >=10 sec to exhibit improved static balance to  decrease his risk of falling.   Baseline: Tandem stance R/L 3 sec/0 sec   Goal status: ONGOING    4.  Patient will improve his FGA score my 5 points to demonstrate a minimal significant improvement in his dynamic balance thus decreasing his risk for falling.  Baseline: 13/30 Goal status: ONGOING        PLAN: PT FREQUENCY: 1-2x/week   PT DURATION: 8 weeks   PLANNED INTERVENTIONS: Therapeutic exercises, Therapeutic activity, Neuromuscular re-education, Balance training, Gait  training, Patient/Family education, Joint manipulation, Joint mobilization, Stair training, Orthotic/Fit training, DME instructions, Aquatic Therapy, Dry Needling, Cryotherapy, Moist heat, Manual therapy, and Re-evaluation   PLAN FOR NEXT SESSION: Dynamic balance and walking sessions. Progress LE strength and static balance exercises.    Bradly Chris PT, DPT 05/17/2022, 4:38 PM

## 2022-05-19 ENCOUNTER — Encounter: Payer: Medicare Other | Admitting: Physical Therapy

## 2022-05-25 ENCOUNTER — Ambulatory Visit: Payer: Medicare Other | Admitting: Physical Therapy

## 2022-05-25 ENCOUNTER — Encounter: Payer: Self-pay | Admitting: Physical Therapy

## 2022-05-25 DIAGNOSIS — R262 Difficulty in walking, not elsewhere classified: Secondary | ICD-10-CM

## 2022-05-25 DIAGNOSIS — I6930 Unspecified sequelae of cerebral infarction: Secondary | ICD-10-CM

## 2022-05-25 NOTE — Therapy (Signed)
OUTPATIENT PHYSICAL THERAPY TREATMENT NOTE   Patient Name: BLAKELY GLUTH MRN: [id number] DOB:1951/05/02, 71 y.o., male Today's Date: 05/25/2022  PCP: Dr. Tracie Harrier  REFERRING PROVIDER: Dr. Allene Dillon   END OF SESSION:   PT End of Session - 05/25/22 1555     Visit Number 6    Number of Visits 16    Date for PT Re-Evaluation 06/17/22    Authorization Type [employer] Medicare    PT Start Time 1550    PT Stop Time 1630    PT Time Calculation (min) 40 min    Equipment Utilized During Treatment Gait belt    Activity Tolerance Patient tolerated treatment well    Behavior During Therapy WFL for tasks assessed/performed             Past Medical History:  Diagnosis Date   Allergy    Arthritis    psoriatic   Back pain    Cataract    Degenerative disc disease    Diverticulitis    GERD (gastroesophageal reflux disease)    Hypertension    Hypothyroidism    Psoriasis    Reflux    Seizures ([place])    last seizure about 10 years ago   Stroke [facility])    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   Thalassemia    Thyroid disease    Past Surgical History:  Procedure Laterality Date   BACK SURGERY     screws and plate in c4 and c5   CATARACT EXTRACTION W/PHACO Left 05/15/2015   Procedure: CATARACT EXTRACTION PHACO AND INTRAOCULAR LENS PLACEMENT ([place]);  Surgeon: Lyla Glassing, MD;  Location: [facility] ORS;  Service: Ophthalmology;  Laterality: Left;  [place]: 2:40.9 AP: 17.3 CDE: 27.88   CERVICAL FUSION     COLONOSCOPY WITH PROPOFOL N/A 06/14/2016   Procedure: COLONOSCOPY WITH PROPOFOL;  Surgeon: Manya Silvas, MD;  Location: [facility] ENDOSCOPY;  Service: Endoscopy;  Laterality: N/A;   COLONOSCOPY WITH PROPOFOL N/A 03/15/2022   Procedure: COLONOSCOPY WITH PROPOFOL;  Surgeon: [name], DO;  Location: [facility] ENDOSCOPY;  Service: Gastroenterology;  Laterality: N/A;   CORRECTION HAMMER TOE     ESOPHAGOGASTRODUODENOSCOPY (EGD) WITH PROPOFOL N/A 03/15/2022   Procedure: ESOPHAGOGASTRODUODENOSCOPY (EGD)  WITH PROPOFOL;  Surgeon: [name], DO;  Location: [facility] ENDOSCOPY;  Service: Gastroenterology;  Laterality: N/A;   EYE SURGERY Right    cataract   FRACTURE SURGERY     HIP FRACTURE SURGERY Right    pins in hip   JOINT REPLACEMENT     KNEE ARTHROPLASTY Left    total knee replacement   Patient Active Problem List   Diagnosis Date Noted   Syncope 11/05/2019   C2 cervical fracture ([place]) 09/14/2018   Hypertension 03/08/2018   GERD (gastroesophageal reflux disease) 03/08/2018   Thyroid disease 03/08/2018   Degenerative disc disease 03/08/2018   Psoriatic arthritis ([place]) 03/08/2018   Back pain 03/08/2018   Thalassemia minor 03/08/2018   Seizures ([place]) 03/08/2018   H/O cataract 03/08/2018   Seasonal allergies 03/08/2018    REFERRING DIAG: Imbalance   THERAPY DIAG:  Difficulty in walking, not elsewhere classified  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 Ladona Ridgel    HPI The patient is a 71 year old gentleman followed by Dr. Joya Salm at [facility] and presents today for follow-up of low back pain with radiation into the left anterior thigh consistent with an L3 radiculitis. His symptoms began around the fall  2016 without preceding injury or trauma. His symptoms are worsened with heavy lifting, bending, twisting, and occasionally with standing. He has some relief with sitting and rest. Chiropractic manipulation with Dr. Laveda Abbe often provides moderate to good relief. Medications have included Mobic 7.5 mg twice daily as needed, Flexeril 5 mg as needed, and an occasional hydrocodone. He prefers to avoid narcotics if possible.   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 He is now on permanent disability.    He was last evaluated on 06/27/2020 at which time he was experiencing left low back pain rating to the left anterior thigh consistent with  an L3 radiculitis for which he was scheduled for another epidural steroid injection. He was to continue with meloxicam and referred for balance therapy at [facility].       At today's visit ports that he has experienced a flare of pain radiating from the left low back into the left anterior thigh stopping at the knee over the last several months and is hopeful to schedule for another epidural steroid injection. He continues meloxicam. He also continues to experience some imbalance and states that he never heard from [facility] for balance therapy I will refer him today. On exam today he is without any noted weakness in the left lower extremity.   PRECAUTIONS: FALLS  SUBJECTIVE: Pt reports no falls since last session. He recently received a cortisone injection in his low back and he is low back pain feels relieved.   PAIN:  Are you having pain? No   OBJECTIVE:   VITALS: BP 134/88 SpO2 97 HR 106      DIAGNOSTIC FINDINGS: CLINICAL DATA:  71 year old male status post fall backwards with subsequent weakness.   EXAM: MRI LUMBAR SPINE WITHOUT CONTRAST   TECHNIQUE: Multiplanar, multisequence MR imaging of the lumbar spine was performed. No intravenous contrast was administered.   COMPARISON:  Lumbar MRI 05/17/2019. Lumbar spine CT 10/10/2017.   FINDINGS: Segmentation: Transitional anatomy. For the purposes of this report hypoplastic or absent ribs are designated at T12 (full size ribs at T11) resulting in the lowest open disc space at L5-S1, and this is the same numbering system used on the June MRI. Correlation with radiographs is recommended prior to any operative intervention.   Alignment: Stable since June. Moderate chronic levoconvex lumbar scoliosis with superimposed mild grade 1 anterolisthesis of L4 on L5.   Vertebrae: Chronic T12 compression fracture is stable with mild-to-moderate loss of vertebral height. Superimposed mild left L1 superior endplate compression or large Schmorl's  node. Additional chronic endplate changes in the lower lumbar spine. No marrow edema or evidence of acute osseous abnormality. Normal background bone marrow signal. Stable and benign appearing chronic cystic appearing bone lesion in the medial right iliac on series 14, image 36. Intact visible sacrum and SI joints.   Conus medullaris and cauda equina: Conus extends to the T12-L1 level. No lower spinal cord or conus signal abnormality.   Paraspinal and other soft tissues: Stable since June.   Disc levels:   L1-L2 with no stable visible lower thoracic spine through spinal stenosis. There is multilevel chronic moderate and severe lower thoracic and upper lumbar neural foraminal stenosis greater on the right.   L2-L3: Moderate multifactorial spinal stenosis with severe right lateral recess stenosis is stable related to rightward disc osteophyte complex and posterior element hypertrophy. Mild left and severe right L2 foraminal stenosis is stable.   L3-L4: Moderate to severe multifactorial spinal and right lateral recess  stenosis is stable related to circumferential disc osteophyte complex and moderate to severe posterior element hypertrophy. Moderate to severe left and moderate right L3 foraminal stenosis is stable.   L4-L5: Moderate to severe chronic spinal and left greater than right lateral recess stenosis appears stable in the setting of grade 1 anterolisthesis. A small subligamentous synovial cyst has developed on the left (series 11, image 27), although does not exacerbate the stenosis at this time. Moderate to severe left and mild right L4 foraminal stenosis is stable.   L5-S1: Left side assimilation joint with left far lateral disc osteophyte complex and moderate posterior element hypertrophy again noted. No spinal or lateral recess stenosis. Moderate to severe left and mild right L5 foraminal stenosis is stable.   IMPRESSION: 1. Stable MRI appearance of the lumbar spine  since June. 2. Transitional anatomy. Correlation with radiographs is recommended prior to any operative intervention. 3. Chronic levoconvex lumbar scoliosis and grade 1 anterolisthesis at L4-L5 with widespread advanced disc, endplate, and posterior element degeneration. 4. Moderate to severe multifactorial spinal and lateral recess stenosis is stable from L2-L3 through L4-L5. 5. Widespread lower thoracic and lumbar moderate to severe neural foraminal stenosis is stable.     Electronically Signed   By: Genevie Ann M.D.   On: 11/05/2019 02:28   PATIENT SURVEYS:  FOTO 47/51   COGNITION:           Overall cognitive status: Within functional limits for tasks assessed                          SENSATION: Light touch: Impaired on RLE and RUE    MUSCLE LENGTH: Deferred to next visit  Hamstrings: Right NT deg; Left NT deg Marcello Moores test: Right NT deg; Left NT deg   POSTURE:  Left sided trunk  lean    PALPATION: N/a   LE ROM:     Active  Right 03/29/2022 Left 03/29/2022  Hip flexion 120 120  Hip extension 30 30  Hip abduction 45 45  Hip adduction 30 30  Hip internal rotation      Hip external rotation      Knee flexion 135 135  Knee extension 0 0  Ankle dorsiflexion 5 20  Ankle plantarflexion 50 50  Ankle inversion 35 35  Ankle eversion 15 15   (Blank rows = not tested)      LE MMT:   MMT Right 04/23/2022 Left 04/23/2022  Hip flexion 5 5  Hip extension      Hip abduction      Hip adduction      Hip internal rotation      Hip external rotation      Knee flexion 5 5  Knee extension 5 5  Ankle dorsiflexion 2+ 5  Ankle plantarflexion 5 5  Ankle inversion      Ankle eversion       (Blank rows = not tested)      LOWER EXTREMITY SPECIAL TESTS: NT     FUNCTIONAL TESTS:    5 Times Sit to Stand: 0 reps  (Individuals with times that exceed the listed time have worse than average performance - predictive of recurrent falls in healthy community-living subjects)          - 60-69 y.o. 11.4 sec         - 70-79 y.o. 12.6 sec         - 80-89 y.o. 14.8 sec  30 sec chair stands: 0 reps   <12 reps for Males 70-74   *A below average score indicates a risk for falls   STEADI 4-Stage Balance Test: (inability to maintain tandem stance for 10 sec = increased risk for falls) - feet together, eyes open, noncompliant surface: 10/10 sec - semi-tandem stance, eyes open, noncompliant surface: 10/10 sec - tandem stances, eyes open, noncompliant surface: 3/ 0 sec - single leg stance: NT        GAIT: Distance walked: Right sided hemiplegia, decreased stance time on RLE and decreased step length on LLE  Assistive device utilized: None Level of assistance: Complete Independence Comments: H/o of left sided CVA as a child        TODAY'S TREATMENT:  05/25/22  Nu-Step Seat level 11 and arm length 11 for 6 min  Sit to Stand with 1 UE 3 x 10  -min to mod VC to decrease speed of sitting or eccentric portion of exercise  Abdominal Crunches 3 x 10   05/17/22  Nu-Step Seat level 10 and arm length 10 for 5 min   Romberg 1 x 5 with 10 sec holds  Supine Bridges with red TB tied around legs 3 x 10  10 m x 4  vertical head turns  -Seated rest break  10 m x 4 horizontal head turns  -Seated rest break  10 m x 4 horizontal and vertical head turns   05/06/22  THEREX  Nu-Step 6 min with seat level 10   Trial rollator 10 m x 5  -How to lock and unlock brakes  -How to sit safely on a rollator by backing into immovable object   NMR   10 m HOR head turns x 10 10 m VERT head turns x 10  10 m VERT and HOR head turns combined x 10  10 m Fast and Slow  x 10  10 m Fast+Slow + Vert + Horz x 10   05/04/22 Nu-Step seat level 10 for 6 min   Prone Quad Stretch 3 x 30 sec  -Provided pt with a gray TB   Sit to Stand 3 x 10 with 1 UE support -Pt shows increased shortness of breath   Discussed using AFO for right sided foot drop. Also, to increase aerobic  exercise with Nu-Step at [employer].     04/26/22:  Nu-Step Level 2 with seat at 10      FGA  -Item 1 Gait Level Surface: Mild Impairment 2 -Item 2 Change in Gait Speed: Mild Impairment 2 -Item 3 Gait with Horizontal Head Turns: Mild Impairment 2 -Item 4 Gait with Vertical Head Turns: Mild Impairment 2 -Item 5 Gait with Pivot Turn: Mild Impairment 2 -Item 6 Step Over Obstacle: Severe Impairment  0 -Item 7 Gait with Narrow Base of Support: Severe Impairment 0 -Item 8 Gait with Eyes Closed: Severe Impairment 0            -Item 9 Ambulating Backwards: Severe Impairment 0 -Item 10 Steps: Moderate Impairment 1 Total: 13/30           * Score of <=22/30 indicates that patient is at increased risk for falls.              Sit to Stand                Initial Eval:  Sit to Stand with 1 UE support 1 x 5 Romberg 1 x 5 for 10 sec hold  -min VC to  maintain upright posture      PATIENT EDUCATION:  Education details: Form and technique for appropriate exercise  Person educated: Patient Education method: Explanation Education comprehension: verbalized understanding, returned demonstration, and verbal cues required     HOME EXERCISE PROGRAM: Access Code: [email or web address] URL: [email or web address] Date: 05/25/2022 Prepared by: Bradly Chris  Exercises - Sit to Stand with Counter Support  - 1 x daily - 3 x weekly - 3 sets - 5 reps - Romberg Stance  - 1 x daily - 7 x weekly - 1 sets - 5 reps - 10 hold - Seated Sciatic Tensioner  - 1 x daily - 7 x weekly - 1 sets - 10 reps - Prone Quadriceps Stretch with Strap  - 1 x daily - 7 x weekly - 1 sets - 3 reps - 30 hold - Supine Bridge with Resistance Band  - 1 x daily - 3 x weekly - 3 sets - 10 reps - Ab Prep  - 1 x daily - 3 x weekly - 3 sets - 10 reps ASSESSMENT:   CLINICAL IMPRESSION: Pt exhibits improved aerobic capacity with ability to complete exercises without increased SOB albeit most exercises were stationary. PT continues  to recommend use of SPC to provided increased point of contact and increased stability while walking to avoid falls. He will continue to benefit from skilled PT to increase LE strength and endurance, static and dynamic balance, and improve toe clearance with an orthotic prescription to avoid increased right toe drag and increased risk for falling.      OBJECTIVE IMPAIRMENTS Abnormal gait, decreased balance, difficulty walking, decreased ROM, decreased strength, impaired flexibility, impaired sensation, and pain.    ACTIVITY LIMITATIONS shopping and community activity.    PERSONAL FACTORS Fitness, Past/current experiences, Time since onset of injury/illness/exacerbation, and 3+ comorbidities: h/o stroke, psoriatic arthritis, degenerative disc disease  are also affecting patient's functional outcome.      REHAB POTENTIAL: Fair chronicity of right hemiparesis and reduced familial and social support    CLINICAL DECISION MAKING: Stable/uncomplicated   EVALUATION COMPLEXITY: Low     GOALS: Goals reviewed with patient? No   SHORT TERM GOALS: Target date: 05/07/2022  (Remove Blue Hyperlink)   Pt will be independent with HEP in order to improve strength and balance in order to decrease fall risk and improve function at home and work. Baseline: N/a Goal status: ONGOING    2.  Patient will perform sit to stand without use of UE support to demonstrate improve LE strength.  Baseline: Unable to perform without use of hands  Goal status: ONGOING        LONG TERM GOALS: Target date: 06/18/2022  (Remove Blue Hyperlink)   Patient will have improved function and activity level as evidenced by an increase in FOTO score to predicted score. Baseline: 47/51 Goal status: ONGOING    2.  Patient will improve 5 x STS to <11.4 sec to demonstrate LE strength that places him at a decreased risk for falls. Baseline: Unable to perform because needs to use UE support  Goal status: ONGOING    3. Patient will  hold tandem stance for >=10 sec to exhibit improved static balance to decrease his risk of falling.   Baseline: Tandem stance R/L 3 sec/0 sec   Goal status: ONGOING    4.  Patient will improve his FGA score my 5 points to demonstrate a minimal significant improvement in his dynamic balance thus decreasing his risk for falling.  Baseline: 13/30 Goal  status: ONGOING        PLAN: PT FREQUENCY: 1-2x/week   PT DURATION: 8 weeks   PLANNED INTERVENTIONS: Therapeutic exercises, Therapeutic activity, Neuromuscular re-education, Balance training, Gait training, Patient/Family education, Joint manipulation, Joint mobilization, Stair training, Orthotic/Fit training, DME instructions, Aquatic Therapy, Dry Needling, Cryotherapy, Moist heat, Manual therapy, and Re-evaluation   PLAN FOR NEXT SESSION: Dynamic balance and walking sessions. Progress LE strength and static balance exercises.    Bradly Chris PT, DPT 05/25/2022, 3:56 PM

## 2022-05-31 ENCOUNTER — Ambulatory Visit: Payer: Medicare Other | Admitting: Physical Therapy

## 2022-05-31 ENCOUNTER — Encounter: Payer: Self-pay | Admitting: Physical Therapy

## 2022-05-31 DIAGNOSIS — R262 Difficulty in walking, not elsewhere classified: Secondary | ICD-10-CM | POA: Diagnosis not present

## 2022-05-31 DIAGNOSIS — I6930 Unspecified sequelae of cerebral infarction: Secondary | ICD-10-CM

## 2022-05-31 NOTE — Therapy (Signed)
OUTPATIENT PHYSICAL THERAPY TREATMENT NOTE   Patient Name: Anthony Evans MRN: [id number] DOB:[birth year], 71 y.o., male Today's Date: 05/31/2022  PCP: Dr. Barbette Reichmann  REFERRING PROVIDER: Dr. Burman Freestone   END OF SESSION:   PT End of Session - 05/31/22 1511     Visit Number 7    Number of Visits 16    Date for PT Re-Evaluation 06/17/22    Authorization Type [employer] Medicare    PT Start Time 1505    PT Stop Time 1545    PT Time Calculation (min) 40 min    Equipment Utilized During Treatment Gait belt    Activity Tolerance Patient tolerated treatment well    Behavior During Therapy WFL for tasks assessed/performed             Past Medical History:  Diagnosis Date   Allergy    Arthritis    psoriatic   Back pain    Cataract    Degenerative disc disease    Diverticulitis    GERD (gastroesophageal reflux disease)    Hypertension    Hypothyroidism    Psoriasis    Reflux    Seizures (HCC)    last seizure about 10 years ago   Stroke [facility])    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   Thalassemia    Thyroid disease    Past Surgical History:  Procedure Laterality Date   BACK SURGERY     screws and plate in c4 and c5   CATARACT EXTRACTION W/PHACO Left 05/15/2015   Procedure: CATARACT EXTRACTION PHACO AND INTRAOCULAR LENS PLACEMENT (IOC);  Surgeon: Lia Hopping, MD;  Location: [facility] ORS;  Service: Ophthalmology;  Laterality: Left;  [place]: 2:40.9 AP: 17.3 CDE: 27.88   CERVICAL FUSION     COLONOSCOPY WITH PROPOFOL N/A 06/14/2016   Procedure: COLONOSCOPY WITH PROPOFOL;  Surgeon: Scot Jun, MD;  Location: [facility] ENDOSCOPY;  Service: Endoscopy;  Laterality: N/A;   COLONOSCOPY WITH PROPOFOL N/A 03/15/2022   Procedure: COLONOSCOPY WITH PROPOFOL;  Surgeon: Jaynie Collins, DO;  Location: [facility] ENDOSCOPY;  Service: Gastroenterology;  Laterality: N/A;   CORRECTION HAMMER TOE     ESOPHAGOGASTRODUODENOSCOPY (EGD) WITH PROPOFOL N/A 03/15/2022   Procedure: ESOPHAGOGASTRODUODENOSCOPY (EGD)  WITH PROPOFOL;  Surgeon: Jaynie Collins, DO;  Location: [facility] ENDOSCOPY;  Service: Gastroenterology;  Laterality: N/A;   EYE SURGERY Right    cataract   FRACTURE SURGERY     HIP FRACTURE SURGERY Right    pins in hip   JOINT REPLACEMENT     KNEE ARTHROPLASTY Left    total knee replacement   Patient Active Problem List   Diagnosis Date Noted   Syncope 11/05/2019   C2 cervical fracture (HCC) 09/14/2018   Hypertension 03/08/2018   GERD (gastroesophageal reflux disease) 03/08/2018   Thyroid disease 03/08/2018   Degenerative disc disease 03/08/2018   Psoriatic arthritis (HCC) 03/08/2018   Back pain 03/08/2018   Thalassemia minor 03/08/2018   Seizures (HCC) 03/08/2018   H/O cataract 03/08/2018   Seasonal allergies 03/08/2018    REFERRING DIAG: Imbalance   THERAPY DIAG:  Difficulty in walking, not elsewhere classified  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 Josem Kaufmann    HPI The patient is a 71 year old gentleman followed by Dr. Jeral Fruit at [facility] and presents today for follow-up of low back pain with radiation into the left anterior thigh consistent with an L3 radiculitis. His symptoms began around the fall  2016 without preceding injury or trauma. His symptoms are worsened with heavy lifting, bending, twisting, and occasionally with standing. He has some relief with sitting and rest. Chiropractic manipulation with Dr. Alfredo Bach often provides moderate to good relief. Medications have included Mobic 7.5 mg twice daily as needed, Flexeril 5 mg as needed, and an occasional hydrocodone. He prefers to avoid narcotics if possible.   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 He is now on permanent disability.    He was last evaluated on 06/27/2020 at which time he was experiencing left low back pain rating to the left anterior thigh consistent with  an L3 radiculitis for which he was scheduled for another epidural steroid injection. He was to continue with meloxicam and referred for balance therapy at [facility].       At today's visit ports that he has experienced a flare of pain radiating from the left low back into the left anterior thigh stopping at the knee over the last several months and is hopeful to schedule for another epidural steroid injection. He continues meloxicam. He also continues to experience some imbalance and states that he never heard from [facility] for balance therapy I will refer him today. On exam today he is without any noted weakness in the left lower extremity.   PRECAUTIONS: FALLS  SUBJECTIVE: Pt reports no falls since last session and that his exercises have been going ok.   PAIN:  Are you having pain? No   OBJECTIVE:   VITALS: BP 134/88 SpO2 97 HR 106      DIAGNOSTIC FINDINGS: CLINICAL DATA:  71 year old male status post fall backwards with subsequent weakness.   EXAM: MRI LUMBAR SPINE WITHOUT CONTRAST   TECHNIQUE: Multiplanar, multisequence MR imaging of the lumbar spine was performed. No intravenous contrast was administered.   COMPARISON:  Lumbar MRI 05/17/2019. Lumbar spine CT 10/10/2017.   FINDINGS: Segmentation: Transitional anatomy. For the purposes of this report hypoplastic or absent ribs are designated at T12 (full size ribs at T11) resulting in the lowest open disc space at L5-S1, and this is the same numbering system used on the June MRI. Correlation with radiographs is recommended prior to any operative intervention.   Alignment: Stable since June. Moderate chronic levoconvex lumbar scoliosis with superimposed mild grade 1 anterolisthesis of L4 on L5.   Vertebrae: Chronic T12 compression fracture is stable with mild-to-moderate loss of vertebral height. Superimposed mild left L1 superior endplate compression or large Schmorl's node. Additional chronic endplate changes in the lower  lumbar spine. No marrow edema or evidence of acute osseous abnormality. Normal background bone marrow signal. Stable and benign appearing chronic cystic appearing bone lesion in the medial right iliac on series 14, image 36. Intact visible sacrum and SI joints.   Conus medullaris and cauda equina: Conus extends to the T12-L1 level. No lower spinal cord or conus signal abnormality.   Paraspinal and other soft tissues: Stable since June.   Disc levels:   L1-L2 with no stable visible lower thoracic spine through spinal stenosis. There is multilevel chronic moderate and severe lower thoracic and upper lumbar neural foraminal stenosis greater on the right.   L2-L3: Moderate multifactorial spinal stenosis with severe right lateral recess stenosis is stable related to rightward disc osteophyte complex and posterior element hypertrophy. Mild left and severe right L2 foraminal stenosis is stable.   L3-L4: Moderate to severe multifactorial spinal and right lateral recess stenosis is stable related to circumferential disc osteophyte complex and  moderate to severe posterior element hypertrophy. Moderate to severe left and moderate right L3 foraminal stenosis is stable.   L4-L5: Moderate to severe chronic spinal and left greater than right lateral recess stenosis appears stable in the setting of grade 1 anterolisthesis. A small subligamentous synovial cyst has developed on the left (series 11, image 27), although does not exacerbate the stenosis at this time. Moderate to severe left and mild right L4 foraminal stenosis is stable.   L5-S1: Left side assimilation joint with left far lateral disc osteophyte complex and moderate posterior element hypertrophy again noted. No spinal or lateral recess stenosis. Moderate to severe left and mild right L5 foraminal stenosis is stable.   IMPRESSION: 1. Stable MRI appearance of the lumbar spine since June. 2. Transitional anatomy. Correlation with  radiographs is recommended prior to any operative intervention. 3. Chronic levoconvex lumbar scoliosis and grade 1 anterolisthesis at L4-L5 with widespread advanced disc, endplate, and posterior element degeneration. 4. Moderate to severe multifactorial spinal and lateral recess stenosis is stable from L2-L3 through L4-L5. 5. Widespread lower thoracic and lumbar moderate to severe neural foraminal stenosis is stable.     Electronically Signed   By: [place] Jaxin Fulfer M.D.   On: 11/05/2019 02:28   PATIENT SURVEYS:  FOTO 47/51   COGNITION:           Overall cognitive status: Within functional limits for tasks assessed                          SENSATION: Light touch: Impaired on RLE and RUE    MUSCLE LENGTH: Deferred to next visit  Hamstrings: Right NT deg; Left NT deg Maisie Fus test: Right NT deg; Left NT deg   POSTURE:  Left sided trunk  lean    PALPATION: N/a   LE ROM:     Active  Right 03/29/2022 Left 03/29/2022  Hip flexion 120 120  Hip extension 30 30  Hip abduction 45 45  Hip adduction 30 30  Hip internal rotation      Hip external rotation      Knee flexion 135 135  Knee extension 0 0  Ankle dorsiflexion 5 20  Ankle plantarflexion 50 50  Ankle inversion 35 35  Ankle eversion 15 15   (Blank rows = not tested)      LE MMT:   MMT Right 04/23/2022 Left 04/23/2022  Hip flexion 5 5  Hip extension      Hip abduction      Hip adduction      Hip internal rotation      Hip external rotation      Knee flexion 5 5  Knee extension 5 5  Ankle dorsiflexion 2+ 5  Ankle plantarflexion 5 5  Ankle inversion      Ankle eversion       (Blank rows = not tested)      LOWER EXTREMITY SPECIAL TESTS: NT     FUNCTIONAL TESTS:    5 Times Sit to Stand: 0 reps  (Individuals with times that exceed the listed time have worse than average performance - predictive of recurrent falls in healthy community-living subjects)         - 60-69 y.o. 11.4 sec         - 70-79 y.o. 12.6  sec         - 80-89 y.o. 14.8 sec  30 sec chair stands: 0 reps   <12 reps for Males 70-74   *A below average score indicates a risk for falls   STEADI 4-Stage Balance Test: (inability to maintain tandem stance for 10 sec = increased risk for falls) - feet together, eyes open, noncompliant surface: 10/10 sec - semi-tandem stance, eyes open, noncompliant surface: 10/10 sec - tandem stances, eyes open, noncompliant surface: 3/ 0 sec - single leg stance: NT        GAIT: Distance walked: Right sided hemiplegia, decreased stance time on RLE and decreased step length on LLE  Assistive device utilized: None Level of assistance: Complete Independence Comments: H/o of left sided CVA as a child        TODAY'S TREATMENT:  05/31/22   THEREX:  Nu-Step Seat level and arm length 11 for 5 min   Supine Sit and Reaches/Abdominal Crunches 3 x 10 -min VC to inhale as he reaches up and to exhale as he goes down   Sit to Stand 3 x 10  -min VC to increase stand up speed and decrease eccentric phase  -min VC to place nose over toes   NMR:  10 m x 4 horz head turns with SPC  10 m x 4 vert head turns with SPC  10 m x 6 vert + horz head turns with [facility]   05/25/22  Nu-Step Seat level 11 and arm length 11 for 6 min  Sit to Stand with 1 UE 3 x 10  -min to mod VC to decrease speed of sitting or eccentric portion of exercise  Abdominal Crunches 3 x 10   05/17/22  Nu-Step Seat level 10 and arm length 10 for 5 min   Romberg 1 x 5 with 10 sec holds  Supine Bridges with red TB tied around legs 3 x 10  10 m x 4  vertical head turns  -Seated rest break  10 m x 4 horizontal head turns  -Seated rest break  10 m x 4 horizontal and vertical head turns   05/06/22  THEREX  Nu-Step 6 min with seat level 10   Trial rollator 10 m x 5  -How to lock and unlock brakes  -How to sit safely on a rollator by backing into immovable object   NMR   10 m HOR head turns x 10 10 m VERT head  turns x 10  10 m VERT and HOR head turns combined x 10  10 m Fast and Slow  x 10  10 m Fast+Slow + Vert + Horz x 10    PATIENT EDUCATION:  Education details: Form and technique for appropriate exercise  Person educated: Patient Education method: Explanation Education comprehension: verbalized understanding, returned demonstration, and verbal cues required     HOME EXERCISE PROGRAM: Access Code: [email or web address] URL: [email or web address] Date: 05/25/2022 Prepared by: Ellin Goodie  Exercises - Sit to Stand with Counter Support  - 1 x daily - 3 x weekly - 3 sets - 5 reps - Romberg Stance  - 1 x daily - 7 x weekly - 1 sets - 5 reps - 10 hold - Seated Sciatic Tensioner  - 1 x daily - 7 x weekly - 1 sets - 10 reps - Prone Quadriceps Stretch with Strap  - 1 x daily - 7 x weekly - 1 sets - 3 reps - 30 hold - Supine Bridge with Resistance Band  - 1 x daily - 3 x weekly - 3 sets - 10 reps -  Ab Prep  - 1 x daily - 3 x weekly - 3 sets - 10 reps ASSESSMENT:   CLINICAL IMPRESSION:  Pt shows improved aerobic capacity and dynamic balance with use of SPC. He also exhibits improved LE strength with ability to perform sit to stand with no UE support and from a decreased height. He will continue to benefit from skilled PT to increase LE strength and endurance, static and dynamic balance, and improve toe clearance with an orthotic prescription to avoid increased right toe drag and increased risk for falling.   OBJECTIVE IMPAIRMENTS Abnormal gait, decreased balance, difficulty walking, decreased ROM, decreased strength, impaired flexibility, impaired sensation, and pain.    ACTIVITY LIMITATIONS shopping and community activity.    PERSONAL FACTORS Fitness, Past/current experiences, Time since onset of injury/illness/exacerbation, and 3+ comorbidities: h/o stroke, psoriatic arthritis, degenerative disc disease  are also affecting patient's functional outcome.      REHAB POTENTIAL: Fair  chronicity of right hemiparesis and reduced familial and social support    CLINICAL DECISION MAKING: Stable/uncomplicated   EVALUATION COMPLEXITY: Low     GOALS: Goals reviewed with patient? No   SHORT TERM GOALS: Target date: 05/07/2022  (Remove Blue Hyperlink)   Pt will be independent with HEP in order to improve strength and balance in order to decrease fall risk and improve function at home and work. Baseline: N/a Goal status: ONGOING    2.  Patient will perform sit to stand without use of UE support to demonstrate improve LE strength.  Baseline: Unable to perform without use of hands  Goal status: ONGOING        LONG TERM GOALS: Target date: 06/18/2022  (Remove Blue Hyperlink)   Patient will have improved function and activity level as evidenced by an increase in FOTO score to predicted score. Baseline: 47/51 Goal status: ONGOING    2.  Patient will improve 5 x STS to <11.4 sec to demonstrate LE strength that places him at a decreased risk for falls. Baseline: Unable to perform because needs to use UE support  Goal status: ONGOING    3. Patient will hold tandem stance for >=10 sec to exhibit improved static balance to decrease his risk of falling.   Baseline: Tandem stance R/L 3 sec/0 sec   Goal status: ONGOING    4.  Patient will improve his FGA score my 5 points to demonstrate a minimal significant improvement in his dynamic balance thus decreasing his risk for falling.  Baseline: 13/30 Goal status: ONGOING        PLAN: PT FREQUENCY: 1-2x/week   PT DURATION: 8 weeks   PLANNED INTERVENTIONS: Therapeutic exercises, Therapeutic activity, Neuromuscular re-education, Balance training, Gait training, Patient/Family education, Joint manipulation, Joint mobilization, Stair training, Orthotic/Fit training, DME instructions, Aquatic Therapy, Dry Needling, Cryotherapy, Moist heat, Manual therapy, and Re-evaluation   PLAN FOR NEXT SESSION: Dynamic balance and walking  sessions; backward walking and obstacles. Progress LE strength and static balance exercises.    Ellin Goodie PT, DPT 05/31/2022, 4:01 PM

## 2022-06-02 ENCOUNTER — Encounter: Payer: Self-pay | Admitting: Physical Therapy

## 2022-06-02 ENCOUNTER — Ambulatory Visit: Payer: Medicare Other | Admitting: Physical Therapy

## 2022-06-02 DIAGNOSIS — R262 Difficulty in walking, not elsewhere classified: Secondary | ICD-10-CM

## 2022-06-02 DIAGNOSIS — I6930 Unspecified sequelae of cerebral infarction: Secondary | ICD-10-CM

## 2022-06-02 DIAGNOSIS — Z8673 Personal history of transient ischemic attack (TIA), and cerebral infarction without residual deficits: Secondary | ICD-10-CM

## 2022-06-02 NOTE — Therapy (Signed)
OUTPATIENT PHYSICAL THERAPY TREATMENT NOTE   Patient Name: Anthony Evans MRN: [id number] DOB:12/09/1950, 71 y.o., male Today's Date: 06/02/2022  PCP: Dr. Tracie Harrier  REFERRING PROVIDER: Dr. Allene Dillon   END OF SESSION:   PT End of Session - 06/02/22 1507     Visit Number 8    Number of Visits 16    Date for PT Re-Evaluation 06/17/22    Authorization Type [employer] Medicare    PT Start Time 1505    PT Stop Time 1545    PT Time Calculation (min) 40 min    Equipment Utilized During Treatment Gait belt    Activity Tolerance Patient tolerated treatment well    Behavior During Therapy WFL for tasks assessed/performed             Past Medical History:  Diagnosis Date   Allergy    Arthritis    psoriatic   Back pain    Cataract    Degenerative disc disease    Diverticulitis    GERD (gastroesophageal reflux disease)    Hypertension    Hypothyroidism    Psoriasis    Reflux    Seizures ([place])    last seizure about 10 years ago   Stroke [facility] [place])    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   Thalassemia    Thyroid disease    Past Surgical History:  Procedure Laterality Date   BACK SURGERY     screws and plate in c4 and c5   CATARACT EXTRACTION W/PHACO Left 05/15/2015   Procedure: CATARACT EXTRACTION PHACO AND INTRAOCULAR LENS PLACEMENT ([place]);  Surgeon: Lyla Glassing, MD;  Location: [facility] ORS;  Service: Ophthalmology;  Laterality: Left;  [place]: 2:40.9 AP: 17.3 CDE: 27.88   CERVICAL FUSION     COLONOSCOPY WITH PROPOFOL N/A 06/14/2016   Procedure: COLONOSCOPY WITH PROPOFOL;  Surgeon: Manya Silvas, MD;  Location: [facility] ENDOSCOPY;  Service: Endoscopy;  Laterality: N/A;   COLONOSCOPY WITH PROPOFOL N/A 03/15/2022   Procedure: COLONOSCOPY WITH PROPOFOL;  Surgeon: [name], DO;  Location: [facility] ENDOSCOPY;  Service: Gastroenterology;  Laterality: N/A;   CORRECTION HAMMER TOE     ESOPHAGOGASTRODUODENOSCOPY (EGD) WITH PROPOFOL N/A 03/15/2022   Procedure: ESOPHAGOGASTRODUODENOSCOPY (EGD)  WITH PROPOFOL;  Surgeon: [name], DO;  Location: [facility] ENDOSCOPY;  Service: Gastroenterology;  Laterality: N/A;   EYE SURGERY Right    cataract   FRACTURE SURGERY     HIP FRACTURE SURGERY Right    pins in hip   JOINT REPLACEMENT     KNEE ARTHROPLASTY Left    total knee replacement   Patient Active Problem List   Diagnosis Date Noted   Syncope 11/05/2019   C2 cervical fracture ([place]) 09/14/2018   Hypertension 03/08/2018   GERD (gastroesophageal reflux disease) 03/08/2018   Thyroid disease 03/08/2018   Degenerative disc disease 03/08/2018   Psoriatic arthritis ([place]) 03/08/2018   Back pain 03/08/2018   Thalassemia minor 03/08/2018   Seizures ([place]) 03/08/2018   H/O cataract 03/08/2018   Seasonal allergies 03/08/2018    REFERRING DIAG: Imbalance   THERAPY DIAG:  Difficulty in walking, not elsewhere classified  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 Ladona Ridgel    HPI The patient is a 71 year old gentleman followed by Dr. Joya Salm at [place] [facility] and presents today for follow-up of low back pain with radiation into the left anterior thigh consistent with an L3 radiculitis. His symptoms began around the fall  2016 without preceding injury or trauma. His symptoms are worsened with heavy lifting, bending, twisting, and occasionally with standing. He has some relief with sitting and rest. Chiropractic manipulation with Dr. Laveda Abbe often provides moderate to good relief. Medications have included Mobic 7.5 mg twice daily as needed, Flexeril 5 mg as needed, and an occasional hydrocodone. He prefers to avoid narcotics if possible.   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 He is now on permanent disability.    He was last evaluated on 06/27/2020 at which time he was experiencing left low back pain rating to the left anterior thigh consistent with  an L3 radiculitis for which he was scheduled for another epidural steroid injection. He was to continue with meloxicam and referred for balance therapy at [facility].       At today's visit ports that he has experienced a flare of pain radiating from the left low back into the left anterior thigh stopping at the knee over the last several months and is hopeful to schedule for another epidural steroid injection. He continues meloxicam. He also continues to experience some imbalance and states that he never heard from [facility] for balance therapy I will refer him today. On exam today he is without any noted weakness in the left lower extremity.   PRECAUTIONS: FALLS  SUBJECTIVE: Pt reports that he has no falls since last session and that he had increased back pain yesterday.  PAIN:  Are you having pain? 4-5 in anterior portion of left knee    OBJECTIVE:   VITALS: BP 134/88 SpO2 97 HR 106      DIAGNOSTIC FINDINGS: CLINICAL DATA:  71 year old male status post fall backwards with subsequent weakness.   EXAM: MRI LUMBAR SPINE WITHOUT CONTRAST   TECHNIQUE: Multiplanar, multisequence MR imaging of the lumbar spine was performed. No intravenous contrast was administered.   COMPARISON:  Lumbar MRI 05/17/2019. Lumbar spine CT 10/10/2017.   FINDINGS: Segmentation: Transitional anatomy. For the purposes of this report hypoplastic or absent ribs are designated at T12 (full size ribs at T11) resulting in the lowest open disc space at L5-S1, and this is the same numbering system used on the June MRI. Correlation with radiographs is recommended prior to any operative intervention.   Alignment: Stable since June. Moderate chronic levoconvex lumbar scoliosis with superimposed mild grade 1 anterolisthesis of L4 on L5.   Vertebrae: Chronic T12 compression fracture is stable with mild-to-moderate loss of vertebral height. Superimposed mild left L1 superior endplate compression or large Schmorl's node.  Additional chronic endplate changes in the lower lumbar spine. No marrow edema or evidence of acute osseous abnormality. Normal background bone marrow signal. Stable and benign appearing chronic cystic appearing bone lesion in the medial right iliac on series 14, image 36. Intact visible sacrum and SI joints.   Conus medullaris and cauda equina: Conus extends to the T12-L1 level. No lower spinal cord or conus signal abnormality.   Paraspinal and other soft tissues: Stable since June.   Disc levels:   L1-L2 with no stable visible lower thoracic spine through spinal stenosis. There is multilevel chronic moderate and severe lower thoracic and upper lumbar neural foraminal stenosis greater on the right.   L2-L3: Moderate multifactorial spinal stenosis with severe right lateral recess stenosis is stable related to rightward disc osteophyte complex and posterior element hypertrophy. Mild left and severe right L2 foraminal stenosis is stable.   L3-L4: Moderate to severe multifactorial spinal and right lateral recess stenosis  is stable related to circumferential disc osteophyte complex and moderate to severe posterior element hypertrophy. Moderate to severe left and moderate right L3 foraminal stenosis is stable.   L4-L5: Moderate to severe chronic spinal and left greater than right lateral recess stenosis appears stable in the setting of grade 1 anterolisthesis. A small subligamentous synovial cyst has developed on the left (series 11, image 27), although does not exacerbate the stenosis at this time. Moderate to severe left and mild right L4 foraminal stenosis is stable.   L5-S1: Left side assimilation joint with left far lateral disc osteophyte complex and moderate posterior element hypertrophy again noted. No spinal or lateral recess stenosis. Moderate to severe left and mild right L5 foraminal stenosis is stable.   IMPRESSION: 1. Stable MRI appearance of the lumbar spine since  June. 2. Transitional anatomy. Correlation with radiographs is recommended prior to any operative intervention. 3. Chronic levoconvex lumbar scoliosis and grade 1 anterolisthesis at L4-L5 with widespread advanced disc, endplate, and posterior element degeneration. 4. Moderate to severe multifactorial spinal and lateral recess stenosis is stable from L2-L3 through L4-L5. 5. Widespread lower thoracic and lumbar moderate to severe neural foraminal stenosis is stable.     Electronically Signed   By: Genevie Ann M.D.   On: 11/05/2019 02:28   PATIENT SURVEYS:  FOTO 47/51   COGNITION:           Overall cognitive status: Within functional limits for tasks assessed                          SENSATION: Light touch: Impaired on RLE and RUE    MUSCLE LENGTH: Deferred to next visit  Hamstrings: Right NT deg; Left NT deg Marcello Moores test: Right NT deg; Left NT deg   POSTURE:  Left sided trunk  lean    PALPATION: N/a   LE ROM:     Active  Right 03/29/2022 Left 03/29/2022  Hip flexion 120 120  Hip extension 30 30  Hip abduction 45 45  Hip adduction 30 30  Hip internal rotation      Hip external rotation      Knee flexion 135 135  Knee extension 0 0  Ankle dorsiflexion 5 20  Ankle plantarflexion 50 50  Ankle inversion 35 35  Ankle eversion 15 15   (Blank rows = not tested)      LE MMT:   MMT Right 04/23/2022 Left 04/23/2022  Hip flexion 5 5  Hip extension      Hip abduction      Hip adduction      Hip internal rotation      Hip external rotation      Knee flexion 5 5  Knee extension 5 5  Ankle dorsiflexion 2+ 5  Ankle plantarflexion 5 5  Ankle inversion      Ankle eversion       (Blank rows = not tested)      LOWER EXTREMITY SPECIAL TESTS: NT     FUNCTIONAL TESTS:    5 Times Sit to Stand: 0 reps  (Individuals with times that exceed the listed time have worse than average performance - predictive of recurrent falls in healthy community-living subjects)         -  60-69 y.o. 11.4 sec         - 70-79 y.o. 12.6 sec         - 80-89 y.o. 14.8 sec  30 sec chair stands: 0 reps   <12 reps for Males 70-74   *A below average score indicates a risk for falls   STEADI 4-Stage Balance Test: (inability to maintain tandem stance for 10 sec = increased risk for falls) - feet together, eyes open, noncompliant surface: 10/10 sec - semi-tandem stance, eyes open, noncompliant surface: 10/10 sec - tandem stances, eyes open, noncompliant surface: 3/ 0 sec - single leg stance: NT        GAIT: Distance walked: Right sided hemiplegia, decreased stance time on RLE and decreased step length on LLE  Assistive device utilized: None Level of assistance: Complete Independence Comments: H/o of left sided CVA as a child        TODAY'S TREATMENT:  06/02/22  THEREX   Nu-Step Seat level and arm length 11 for 5 min Standing Heel Raises with BUE support  3 x 10    NMR   Step on and off 4 inch platform x 2 for 27 ft x 6 with SPC   Step over obstacles 1 ft x 5 for 27 ft x 6 with SPC  Figure 8s x 3 cones for 27 ft x 10  Figure 8s x 3 cones for 27 ft x 6  -min VC to recall numbers on PT's hand to prompt patient to look up while walking    05/31/22   THEREX:  Nu-Step Seat level and arm length 11 for 5 min   Supine Sit and Reaches/Abdominal Crunches 3 x 10 -min VC to inhale as he reaches up and to exhale as he goes down   Sit to Stand 3 x 10  -min VC to increase stand up speed and decrease eccentric phase  -min VC to place nose over toes   NMR:  10 m x 4 horz head turns with SPC  10 m x 4 vert head turns with SPC  10 m x 6 vert + horz head turns with [facility]   05/25/22  Nu-Step Seat level 11 and arm length 11 for 6 min  Sit to Stand with 1 UE 3 x 10  -min to mod VC to decrease speed of sitting or eccentric portion of exercise  Abdominal Crunches 3 x 10    PATIENT EDUCATION:  Education details: Form and technique for appropriate exercise   Person educated: Patient Education method: Explanation Education comprehension: verbalized understanding, returned demonstration, and verbal cues required     HOME EXERCISE PROGRAM: Access Code: [email or web address] URL: [email or web address] Date: 06/02/2022 Prepared by: Bradly Chris  Exercises - Sit to Stand with Counter Support  - 1 x daily - 3 x weekly - 3 sets - 5 reps - Romberg Stance  - 1 x daily - 7 x weekly - 1 sets - 5 reps - 10 hold - Seated Sciatic Tensioner  - 1 x daily - 7 x weekly - 1 sets - 10 reps - Prone Quadriceps Stretch with Strap  - 1 x daily - 7 x weekly - 1 sets - 3 reps - 30 hold - Supine Bridge with Resistance Band  - 1 x daily - 3 x weekly - 3 sets - 10 reps - Ab Prep  - 1 x daily - 3 x weekly - 3 sets - 10 reps - Standing Heel Raise with Support  - 1 x daily - 3 x weekly - 3 sets - 10 reps ASSESSMENT:   CLINICAL IMPRESSION:  Pt continues to be limited by fatigue when completing dynamic balance exercises having  to take several seated rest breaks. Despite increased left knee pain, he was able to complete exercises. Pt requires mod VC and frequent redirection to remain on task. Able to negotiate cones and obstacles, but with increased difficulty without looking down. He will continue to benefit from skilled PT to improve static and dynamic balance and LE strength to decrease his risk for falling.   OBJECTIVE IMPAIRMENTS Abnormal gait, decreased balance, difficulty walking, decreased ROM, decreased strength, impaired flexibility, impaired sensation, and pain.    ACTIVITY LIMITATIONS shopping and community activity.    PERSONAL FACTORS Fitness, Past/current experiences, Time since onset of injury/illness/exacerbation, and 3+ comorbidities: h/o stroke, psoriatic arthritis, degenerative disc disease  are also affecting patient's functional outcome.      REHAB POTENTIAL: Fair chronicity of right hemiparesis and reduced familial and social support     CLINICAL DECISION MAKING: Stable/uncomplicated   EVALUATION COMPLEXITY: Low     GOALS: Goals reviewed with patient? No   SHORT TERM GOALS: Target date: 05/07/2022  (Remove Blue Hyperlink)   Pt will be independent with HEP in order to improve strength and balance in order to decrease fall risk and improve function at home and work. Baseline: N/a Goal status: ONGOING    2.  Patient will perform sit to stand without use of UE support to demonstrate improve LE strength.  Baseline: Unable to perform without use of hands  Goal status: ONGOING        LONG TERM GOALS: Target date: 06/18/2022  (Remove Blue Hyperlink)   Patient will have improved function and activity level as evidenced by an increase in FOTO score to predicted score. Baseline: 47/51 Goal status: ONGOING    2.  Patient will improve 5 x STS to <11.4 sec to demonstrate LE strength that places him at a decreased risk for falls. Baseline: Unable to perform because needs to use UE support  Goal status: ONGOING    3. Patient will hold tandem stance for >=10 sec to exhibit improved static balance to decrease his risk of falling.   Baseline: Tandem stance R/L 3 sec/0 sec   Goal status: ONGOING    4.  Patient will improve his FGA score my 5 points to demonstrate a minimal significant improvement in his dynamic balance thus decreasing his risk for falling.  Baseline: 13/30 Goal status: ONGOING        PLAN: PT FREQUENCY: 1-2x/week   PT DURATION: 8 weeks   PLANNED INTERVENTIONS: Therapeutic exercises, Therapeutic activity, Neuromuscular re-education, Balance training, Gait training, Patient/Family education, Joint manipulation, Joint mobilization, Stair training, Orthotic/Fit training, DME instructions, Aquatic Therapy, Dry Needling, Cryotherapy, Moist heat, Manual therapy, and Re-evaluation   PLAN FOR NEXT SESSION: Dynamic balance and walking sessions; backward walking and obstacles. Progress LE strength and static balance  exercises.    Bradly Chris PT, DPT 06/02/2022, 3:08 PM

## 2022-06-09 ENCOUNTER — Ambulatory Visit: Payer: Medicare Other | Admitting: Physical Therapy

## 2022-06-10 ENCOUNTER — Encounter: Payer: Self-pay | Admitting: Gastroenterology

## 2022-06-10 NOTE — H&P (Signed)
Pre-Procedure H&P   Patient ID: Anthony Evans is a 71 y.o. male.  Gastroenterology Provider: [name], DO  Referring Provider: Octavia Bruckner, PA PCP: Tracie Harrier, MD  Date: 06/11/2022  HPI Anthony Evans is a 71 y.o. male who presents today for Esophagogastroduodenoscopy for Grade C esophagitis follow-up. Patient underwent esophagogastroduodenoscopy in April demonstrating grade C esophagitis.  Polyps were found in the stomach as well as nodule of the esophagus.  Biopsies were negative for H. pylori and Barrett's esophagus.  Fibropurulent pathology on esophageal nodule biopsy.  Prior to this he was experiencing heartburn 1-2 times a week which has improved with PPI use.  Celiac biopsies were also negative.  Noted to have a small hiatal hernia. Family history-father-colon polyps and gallbladder disease Most recent lab work hemoglobin 10.3 MCV 75 platelets 370,000.  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   Thalassemia    Thyroid disease     Past Surgical History:  Procedure Laterality Date   BACK SURGERY     screws and plate in c4 and c5   CATARACT EXTRACTION W/PHACO Left 05/15/2015   Procedure: CATARACT EXTRACTION PHACO AND INTRAOCULAR LENS PLACEMENT ([place]);  Surgeon: Lyla Glassing, MD;  Location: [facility] ORS;  Service: Ophthalmology;  Laterality: Left;  [place]: 2:40.9 AP:  17.3 CDE: 27.88   CERVICAL FUSION     COLONOSCOPY WITH PROPOFOL N/A 06/14/2016   Procedure: COLONOSCOPY WITH PROPOFOL;  Surgeon: Manya Silvas, MD;  Location: [facility] ENDOSCOPY;  Service: Endoscopy;  Laterality: N/A;   COLONOSCOPY WITH PROPOFOL N/A 03/15/2022   Procedure: COLONOSCOPY WITH PROPOFOL;  Surgeon: [name], DO;  Location: [facility] ENDOSCOPY;  Service: Gastroenterology;  Laterality: N/A;   CORRECTION HAMMER TOE     ESOPHAGOGASTRODUODENOSCOPY (EGD) WITH PROPOFOL N/A 03/15/2022   Procedure: ESOPHAGOGASTRODUODENOSCOPY (EGD) WITH PROPOFOL;  Surgeon: [name], DO;  Location: [facility] ENDOSCOPY;  Service: Gastroenterology;  Laterality: N/A;   EYE SURGERY Right    cataract   FRACTURE SURGERY     HIP FRACTURE SURGERY Right    pins in hip   JOINT REPLACEMENT     KNEE ARTHROPLASTY Left    total knee replacement    Family History Father-history of colon polyps and gallbladder disease No h/o GI disease or malignancy  Review of Systems  Constitutional:  Negative for activity change, appetite change, chills, diaphoresis, fatigue, fever and unexpected weight change.  HENT:  Negative for trouble swallowing and voice change.   Respiratory:  Negative for shortness of breath and wheezing.   Cardiovascular:  Negative for chest pain, palpitations and leg swelling.  Gastrointestinal:  Negative for abdominal distention, abdominal pain, anal bleeding, blood in stool, constipation, diarrhea, nausea and vomiting.  Musculoskeletal:  Negative for arthralgias and  myalgias.  Skin:  Negative for color change and pallor.  Neurological:  Negative for dizziness, syncope and weakness.  Psychiatric/Behavioral:  Negative for confusion. The patient is not nervous/anxious.   All other systems reviewed and are negative.    Medications No current facility-administered medications on file prior to encounter.   Current Outpatient Medications on File Prior to Encounter  Medication Sig Dispense  Refill   amLODipine (NORVASC) 5 MG tablet Take 5 mg by mouth daily.     Ascorbic Acid (VITAMIN C) 1000 MG tablet Take 1,000 mg by mouth daily.     calcium carbonate (OSCAL) 1500 (600 Ca) MG TABS tablet Take by mouth 2 (two) times daily with a meal.     carbamazepine (TEGRETOL) 200 MG tablet Take 400 mg by mouth 3 (three) times daily.     Cyanocobalamin (VITAMIN B 12) 100 MCG LOZG Take 100 mcg by mouth daily.     fenofibrate (TRICOR) 48 MG tablet Take 48 mg by mouth daily.     folic acid (FOLVITE) 1 MG tablet Take 1 mg by mouth daily.     gabapentin (NEURONTIN) 300 MG capsule Take 300 mg by mouth 2 (two) times daily.     levothyroxine (SYNTHROID, LEVOTHROID) 175 MCG tablet Take 175 mcg by mouth daily.      meloxicam (MOBIC) 7.5 MG tablet Take 7.5 mg by mouth 2 (two) times daily.      Multiple Vitamins-Minerals (CENTRUM SILVER 50+WOMEN) TABS Take 1 tablet by mouth daily.     primidone (MYSOLINE) 250 MG tablet Take 250 mg by mouth 3 (three) times daily.      tamsulosin (FLOMAX) 0.4 MG CAPS capsule Take 1 capsule (0.4 mg total) by mouth daily. 30 capsule 0   timolol (TIMOPTIC) 0.5 % ophthalmic solution Place 1 drop into both eyes daily.  2   triamterene-hydrochlorothiazide (MAXZIDE) 75-50 MG per tablet Take 1 tablet by mouth daily.      vitamin E 400 UNIT capsule Take 400 Units by mouth daily.     amoxicillin (AMOXIL) 500 MG capsule Take 500 mg by mouth 4 (four) times daily as needed (dental).     Calcipotriene-Betameth Diprop (WYNZORA) 0.005-0.064 % CREA Apply to hands 5 nights a week at bedtime 60 g 1   cetirizine (ZYRTEC) 10 MG tablet Take 10 mg by mouth daily.     clobetasol cream (TEMOVATE) 1.47 % Apply 1 application. topically 2 (two) times daily.     cyclobenzaprine (FLEXERIL) 5 MG tablet Take 5 mg by mouth 3 (three) times daily as needed for muscle spasms.  (Patient not taking: Reported on 04/22/2022)     HYDROcodone-acetaminophen (NORCO/VICODIN) 5-325 MG tablet Take 1 tablet by mouth as  needed for moderate pain.     hydrocortisone 2.5 % ointment Apply topically as needed.     hydroxychloroquine (PLAQUENIL) 200 MG tablet Take 200 mg by mouth daily.  (Patient not taking: Reported on 03/15/2022)     meclizine (ANTIVERT) 25 MG tablet Take 1 tablet (25 mg total) by mouth 3 (three) times daily as needed for dizziness. 30 tablet 0   methotrexate (RHEUMATREX) 2.5 MG tablet Take 10 mg by mouth every Wednesday.      triamcinolone cream (KENALOG) 0.1 % Apply 1 application topically See admin instructions.      Pertinent medications related to GI and procedure were reviewed by me with the patient prior to the procedure   Current Facility-Administered Medications:    0.9 %  sodium chloride infusion, , Intravenous,  Continuous, [name], DO, Last Rate: 20 mL/hr at 06/11/22 1209, New Bag at 06/11/22 1209      Allergies  Allergen Reactions   Etanercept Nausea And Vomiting   Etodolac Nausea And Vomiting   Rubbing Alcohol [Isopropyl Alcohol] Rash   Allergies were reviewed by me prior to the procedure  Objective   Body mass index is 31.57 kg/m. Vitals:   06/11/22 1153  BP: (!) 156/88  Pulse: (!) 111  Resp: 18  Temp: 97.9 F (36.6 C)  TempSrc: Temporal  SpO2: 97%  Weight: 99.8 kg  Height: '5\' 10"'$  (1.778 m)     Physical Exam Vitals and nursing note reviewed.  Constitutional:      General: He is not in acute distress.    Appearance: Normal appearance. He is not ill-appearing, toxic-appearing or diaphoretic.  HENT:     Head: Normocephalic and atraumatic.     Nose: Nose normal.     Mouth/Throat:     Mouth: Mucous membranes are moist.     Pharynx: Oropharynx is clear.  Eyes:     General: No scleral icterus.    Extraocular Movements: Extraocular movements intact.  Cardiovascular:     Rate and Rhythm: Regular rhythm. Tachycardia present.     Heart sounds: Normal heart sounds. No murmur heard.    No friction rub. No gallop.  Pulmonary:     Effort:  Pulmonary effort is normal. No respiratory distress.     Breath sounds: Normal breath sounds. No wheezing, rhonchi or rales.  Abdominal:     General: Bowel sounds are normal. There is no distension.     Palpations: Abdomen is soft.     Tenderness: There is no abdominal tenderness. There is no guarding or rebound.  Musculoskeletal:     Cervical back: Neck supple.     Right lower leg: No edema.     Left lower leg: No edema.  Skin:    General: Skin is warm and dry.     Coloration: Skin is not jaundiced or pale.  Neurological:     Mental Status: He is alert and oriented to person, place, and time. Mental status is at baseline.  Psychiatric:        Mood and Affect: Mood normal.        Behavior: Behavior normal.        Thought Content: Thought content normal.        Judgment: Judgment normal.      Assessment:  Anthony Evans is a 71 y.o. male  who presents today for Esophagogastroduodenoscopy for Grade C esophagitis follow-up.  Plan:  Esophagogastroduodenoscopy with possible intervention today  Esophagogastroduodenoscopy with possible biopsy, control of bleeding, polypectomy, and interventions as necessary has been discussed with the patient/patient representative. Informed consent was obtained from the patient/patient representative after explaining the indication, nature, and risks of the procedure including but not limited to death, bleeding, perforation, missed neoplasm/lesions, cardiorespiratory compromise, and reaction to medications. Opportunity for questions was given and appropriate answers were provided. Patient/patient representative has verbalized understanding is amenable to undergoing the procedure.   [name], DO  [facility]  Portions of the record may have been created with voice recognition software. Occasional wrong-word or 'sound-a-like' substitutions may have occurred due to the inherent limitations of voice recognition software.   Read the chart carefully and recognize, using context, where substitutions may have occurred.

## 2022-06-11 ENCOUNTER — Encounter
Admission: RE | Disposition: A | Discharge status: Home / Self Care | Payer: Self-pay | Source: Home / Self Care | Attending: Gastroenterology

## 2022-06-11 ENCOUNTER — Ambulatory Visit: Payer: Medicare Other | Admitting: Certified Registered"

## 2022-06-11 ENCOUNTER — Encounter: Payer: Self-pay | Admitting: Gastroenterology

## 2022-06-11 ENCOUNTER — Ambulatory Visit
Admission: RE | Admit: 2022-06-11 | Discharge: 2022-06-11 | Disposition: A | Discharge status: Home / Self Care | Payer: Medicare Other | Attending: Gastroenterology | Admitting: Gastroenterology

## 2022-06-11 DIAGNOSIS — E039 Hypothyroidism, unspecified: Secondary | ICD-10-CM | POA: Diagnosis not present

## 2022-06-11 DIAGNOSIS — Z6831 Body mass index (BMI) 31.0-31.9, adult: Secondary | ICD-10-CM | POA: Diagnosis not present

## 2022-06-11 DIAGNOSIS — Z79899 Other long term (current) drug therapy: Secondary | ICD-10-CM | POA: Insufficient documentation

## 2022-06-11 DIAGNOSIS — I10 Essential (primary) hypertension: Secondary | ICD-10-CM | POA: Diagnosis not present

## 2022-06-11 DIAGNOSIS — K2100 Gastro-esophageal reflux disease with esophagitis, without bleeding: Secondary | ICD-10-CM | POA: Insufficient documentation

## 2022-06-11 DIAGNOSIS — K449 Diaphragmatic hernia without obstruction or gangrene: Secondary | ICD-10-CM | POA: Insufficient documentation

## 2022-06-11 DIAGNOSIS — K2970 Gastritis, unspecified, without bleeding: Secondary | ICD-10-CM | POA: Insufficient documentation

## 2022-06-11 HISTORY — PX: ESOPHAGOGASTRODUODENOSCOPY: SHX5428

## 2022-06-11 SURGERY — EGD (ESOPHAGOGASTRODUODENOSCOPY)
Anesthesia: General

## 2022-06-11 MED ORDER — LIDOCAINE HCL (PF) 2 % IJ SOLN
INTRAMUSCULAR | Status: AC
  Filled 2022-06-11: qty 5

## 2022-06-11 MED ORDER — PHENYLEPHRINE 80 MCG/ML (10ML) SYRINGE FOR IV PUSH (FOR BLOOD PRESSURE SUPPORT)
PREFILLED_SYRINGE | INTRAVENOUS | Status: AC
  Filled 2022-06-11: qty 10

## 2022-06-11 MED ORDER — PROPOFOL 10 MG/ML IV BOLUS
INTRAVENOUS | Status: DC | PRN
  Administered 2022-06-11: 140 ug/kg/min via INTRAVENOUS
  Administered 2022-06-11: 100 mg via INTRAVENOUS

## 2022-06-11 MED ORDER — LIDOCAINE HCL (CARDIAC) PF 100 MG/5ML IV SOSY
PREFILLED_SYRINGE | INTRAVENOUS | Status: DC | PRN
  Administered 2022-06-11: 100 mg via INTRAVENOUS

## 2022-06-11 MED ORDER — SODIUM CHLORIDE 0.9 % IV SOLN: INTRAVENOUS | Status: DC

## 2022-06-11 MED ORDER — PROPOFOL 10 MG/ML IV BOLUS
INTRAVENOUS | Status: AC
  Filled 2022-06-11: qty 20

## 2022-06-11 NOTE — Transfer of Care (Signed)
Immediate Anesthesia Transfer of Care Note  Patient: Anthony Evans  Procedure(s) Performed: ESOPHAGOGASTRODUODENOSCOPY (EGD)  Patient Location: PACU  Anesthesia Type:General  Level of Consciousness: awake  Airway & Oxygen Therapy: Patient Spontanous Breathing  Post-op Assessment: Report given to RN and Post -op Vital signs reviewed and stable  Post vital signs: Reviewed and stable  Last Vitals:  Vitals Value Taken Time  BP 126/85 06/11/22 1241  Temp 36.4 C 06/11/22 1241  Pulse 99 06/11/22 1243  Resp 23 06/11/22 1243  SpO2 95 % 06/11/22 1243  Vitals shown include unvalidated device data.  Last Pain:  Vitals:   06/11/22 1241  TempSrc: Temporal  PainSc: 0-No pain         Complications: No notable events documented.

## 2022-06-11 NOTE — Interval H&P Note (Signed)
History and Physical Interval Note: Preprocedure H&P from 06/11/22  was reviewed and there was no interval change after seeing and examining the patient.  Written consent was obtained from the patient after discussion of risks, benefits, and alternatives. Patient has consented to proceed with Esophagogastroduodenoscopy with possible intervention   06/11/2022 12:21 PM  Anthony Evans  has presented today for surgery, with the diagnosis of Hx of adenomatous colonic polyps (Z86.010) Gastroesophageal reflux disease with hiatal hernia (K21.9,K44.9).  The various methods of treatment have been discussed with the patient and family. After consideration of risks, benefits and other options for treatment, the patient has consented to  Procedure(s): ESOPHAGOGASTRODUODENOSCOPY (EGD) (N/A) as a surgical intervention.  The patient's history has been reviewed, patient examined, no change in status, stable for surgery.  I have reviewed the patient's chart and labs.  Questions were answered to the patient's satisfaction.     [name]

## 2022-06-11 NOTE — H&P (Signed)
Addendum/correction to H&P- pt with cardiac murmur present

## 2022-06-11 NOTE — Anesthesia Preprocedure Evaluation (Signed)
Anesthesia Evaluation  Patient identified by MRN, date of birth, ID band Patient awake    Reviewed: Allergy & Precautions, NPO status , Patient's Chart, lab work & pertinent test results  Airway Mallampati: III  TM Distance: >3 FB Neck ROM: Full    Dental  (+) Teeth Intact   Pulmonary neg pulmonary ROS, shortness of breath and with exertion,    Pulmonary exam normal  + decreased breath sounds      Cardiovascular Exercise Tolerance: Poor hypertension, Pt. on medications negative cardio ROS Normal cardiovascular exam Rhythm:Regular     Neuro/Psych Seizures -,  negative neurological ROS  negative psych ROS   GI/Hepatic negative GI ROS, Neg liver ROS, GERD  Medicated,  Endo/Other  negative endocrine ROSHypothyroidism Morbid obesity  Renal/GU negative Renal ROS  negative genitourinary   Musculoskeletal   Abdominal (+) + obese,   Peds negative pediatric ROS (+)  Hematology negative hematology ROS (+) Blood dyscrasia, anemia ,   Anesthesia Other Findings Past Medical History: No date: Allergy No date: Arthritis     Comment:  psoriatic No date: Back pain No date: Cataract No date: Degenerative disc disease No date: Diverticulitis No date: GERD (gastroesophageal reflux disease) No date: Hypertension No date: Hypothyroidism No date: Psoriasis No date: Reflux No date: Seizures ([place])     Comment:  last seizure about 10 years ago No date: Stroke [facility])     Comment:  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 No date: Thalassemia No date: Thyroid disease  Past Surgical History: No date: BACK SURGERY     Comment:  screws and plate in c4 and c5 01/10/5808: CATARACT EXTRACTION W/PHACO; Left     Comment:  Procedure: CATARACT EXTRACTION PHACO AND INTRAOCULAR               LENS PLACEMENT ([facility]);  Surgeon: Lyla Glassing, MD;                Location: [facility] ORS;  Service: Ophthalmology;  Laterality:              Left;  [place]: 2:40.9 AP: 17.3 CDE:  27.88 No date: CERVICAL FUSION 06/14/2016: COLONOSCOPY WITH PROPOFOL; N/A     Comment:  Procedure: COLONOSCOPY WITH PROPOFOL;  Surgeon: Manya Silvas, MD;  Location: [facility] ENDOSCOPY;  Service:               Endoscopy;  Laterality: N/A; 03/15/2022: COLONOSCOPY WITH PROPOFOL; N/A     Comment:  Procedure: COLONOSCOPY WITH PROPOFOL;  Surgeon: [name], DO;  Location: [facility] ENDOSCOPY;  Service:               Gastroenterology;  Laterality: N/A; No date: CORRECTION HAMMER TOE 03/15/2022: ESOPHAGOGASTRODUODENOSCOPY (EGD) WITH PROPOFOL; N/A     Comment:  Procedure: ESOPHAGOGASTRODUODENOSCOPY (EGD) WITH               PROPOFOL;  Surgeon: [name], DO;  Location:              [facility] ENDOSCOPY;  Service: Gastroenterology;  Laterality:               N/A; No date: EYE SURGERY; Right     Comment:  cataract No date: FRACTURE SURGERY No date: HIP FRACTURE SURGERY; Right     Comment:  pins in hip No date: JOINT REPLACEMENT No date: KNEE ARTHROPLASTY; Left  Comment:  total knee replacement     Reproductive/Obstetrics negative OB ROS                             Anesthesia Physical Anesthesia Plan  ASA: 3  Anesthesia Plan: General   Post-op Pain Management:    Induction: Intravenous  PONV Risk Score and Plan: Propofol infusion and TIVA  Airway Management Planned: Natural Airway  Additional Equipment:   Intra-op Plan:   Post-operative Plan:   Informed Consent: I have reviewed the patients History and Physical, chart, labs and discussed the procedure including the risks, benefits and alternatives for the proposed anesthesia with the patient or authorized representative who has indicated his/her understanding and acceptance.     Dental Advisory Given  Plan Discussed with: CRNA and Surgeon  Anesthesia Plan Comments:         Anesthesia Quick Evaluation

## 2022-06-11 NOTE — Op Note (Signed)
[facility] Gastroenterology Patient Name: Anthony Evans Procedure Date: 06/11/2022 11:57 AM MRN: [id number] Account #: [id number] Date of Birth: 1951-06-28 Admit Type: Outpatient Age: 71 Room: [facility] ENDO ROOM 1 Gender: Male Note Status: Finalized Instrument Name: Upper Endoscope [id number] Procedure:             Upper GI endoscopy Indications:           Follow-up of reflux esophagitis Providers:             [name] DO, DO Referring MD:          Tracie Harrier, MD (Referring MD) Medicines:             Monitored Anesthesia Care Complications:         No immediate complications. Estimated blood loss:                         Minimal. Procedure:             Pre-Anesthesia Assessment:                        - Prior to the procedure, a History and Physical was                         performed, and patient medications and allergies were                         reviewed. The patient is competent. The risks and                         benefits of the procedure and the sedation options and                         risks were discussed with the patient. All questions                         were answered and informed consent was obtained.                         Patient identification and proposed procedure were                         verified by the physician, the nurse, the anesthetist                         and the technician in the endoscopy suite. Mental                         Status Examination: alert and oriented. Airway                         Examination: normal oropharyngeal airway and neck                         mobility. Respiratory Examination: clear to                         auscultation. CV Examination: systolic murmur.  Prophylactic Antibiotics: The patient does not require                         prophylactic antibiotics. Prior Anticoagulants: The                         patient has taken no previous anticoagulant or                          antiplatelet agents. ASA Grade Assessment: III - A                         patient with severe systemic disease. After reviewing                         the risks and benefits, the patient was deemed in                         satisfactory condition to undergo the procedure. The                         anesthesia plan was to use monitored anesthesia care                         (MAC). Immediately prior to administration of                         medications, the patient was re-assessed for adequacy                         to receive sedatives. The heart rate, respiratory                         rate, oxygen saturations, blood pressure, adequacy of                         pulmonary ventilation, and response to care were                         monitored throughout the procedure. The physical                         status of the patient was re-assessed after the                         procedure.                        After obtaining informed consent, the endoscope was                         passed under direct vision. Throughout the procedure,                         the patient's blood pressure, pulse, and oxygen                         saturations were monitored continuously. The Endoscope  was introduced through the mouth, and advanced to the                         second part of duodenum. The upper GI endoscopy was                         accomplished without difficulty. The patient tolerated                         the procedure well. Findings:      The duodenal bulb, first portion of the duodenum and second portion of       the duodenum were normal. Estimated blood loss was minimal.      Localized mild inflammation characterized by erythema was found in the       gastric antrum. No biopsies taken as they were performed in April and       negative for H Pylori. Estimated blood loss: none.      A small hiatal hernia was present. Estimated  blood loss: none.      The exam of the stomach was otherwise normal.      The Z-line was irregular. Biopsies were taken with a cold forceps for       histology. Variability approximately C0M2 Estimated blood loss was       minimal.      Esophagogastric landmarks were identified: the gastroesophageal junction       was found at 40 cm from the incisors.      The exam of the esophagus was otherwise normal.      LA Grade A (one or more mucosal breaks less than 5 mm, not extending       between tops of 2 mucosal folds) esophagitis with no bleeding was found.       Estimated blood loss: none. Impression:            - Normal duodenal bulb, first portion of the duodenum                         and second portion of the duodenum.                        - Gastritis.                        - Small hiatal hernia.                        - Z-line irregular. Biopsied.                        - Esophagogastric landmarks identified.                        - LA Grade A reflux esophagitis with no bleeding. Recommendation:        - Discharge patient to home.                        - Resume previous diet.                        - Continue present medications.                        -  No aspirin, ibuprofen, naproxen, or other                         non-steroidal anti-inflammatory drugs.                        - Await pathology results.                        - Repeat upper endoscopy for surveillance based on                         pathology results.                        - Return to referring physician as previously                         scheduled.                        - The findings and recommendations were discussed with                         the patient. Procedure Code(s):     --- Professional ---                        [phone], Esophagogastroduodenoscopy, flexible,                         transoral; with biopsy, single or multiple Diagnosis Code(s):     --- Professional ---                         K29.70, Gastritis, unspecified, without bleeding                        K44.9, Diaphragmatic hernia without obstruction or                         gangrene                        K22.8, Other specified diseases of esophagus                        K21.00, Gastro-esophageal reflux disease with                         esophagitis, without bleeding CPT copyright 2019 American Medical Association. All rights reserved. The codes documented in this report are preliminary and upon coder review may  be revised to meet current compliance requirements. Attending Participation:      I personally performed the entire procedure. Volney American, DO [name] DO, DO 06/11/2022 12:41:34 PM This report has been signed electronically. Number of Addenda: 0 Note Initiated On: 06/11/2022 11:57 AM Estimated Blood Loss:  Estimated blood loss was minimal.      [facility]

## 2022-06-11 NOTE — Anesthesia Postprocedure Evaluation (Signed)
Anesthesia Post Note  Patient: Anthony Evans  Procedure(s) Performed: ESOPHAGOGASTRODUODENOSCOPY (EGD)  Patient location during evaluation: PACU Anesthesia Type: General Level of consciousness: awake and awake and alert Pain management: pain level controlled Vital Signs Assessment: post-procedure vital signs reviewed and stable Respiratory status: spontaneous breathing and nonlabored ventilation Cardiovascular status: stable Anesthetic complications: no   No notable events documented.   Last Vitals:  Vitals:   06/11/22 1153 06/11/22 1241  BP: (!) 156/88 126/85  Pulse: (!) 111 (!) 102  Resp: 18 19  Temp: 36.6 C (!) 36.4 C  SpO2: 97% 93%    Last Pain:  Vitals:   06/11/22 1241  TempSrc: Temporal  PainSc: 0-No pain                 VAN STAVEREN,Danita Proud

## 2022-06-14 ENCOUNTER — Ambulatory Visit: Payer: Medicare Other | Attending: Family Medicine | Admitting: Physical Therapy

## 2022-06-14 ENCOUNTER — Encounter: Payer: Self-pay | Admitting: Physical Therapy

## 2022-06-14 DIAGNOSIS — R262 Difficulty in walking, not elsewhere classified: Secondary | ICD-10-CM | POA: Diagnosis not present

## 2022-06-14 DIAGNOSIS — I6930 Unspecified sequelae of cerebral infarction: Secondary | ICD-10-CM | POA: Insufficient documentation

## 2022-06-14 LAB — SURGICAL PATHOLOGY

## 2022-06-14 NOTE — Therapy (Signed)
OUTPATIENT PHYSICAL THERAPY TREATMENT NOTE   Patient Name: Anthony Evans MRN: [id number] DOB:Aug 13, 1951, 71 y.o., male Today's Date: 06/14/2022  PCP: Dr. Tracie Harrier  REFERRING PROVIDER: Dr. Allene Dillon   END OF SESSION:   PT End of Session - 06/14/22 1724     Visit Number 9    Number of Visits 16    Date for PT Re-Evaluation 06/17/22    Authorization Type [employer] Medicare    PT Start Time 1720    PT Stop Time 1800    PT Time Calculation (min) 40 min    Equipment Utilized During Treatment Gait belt    Activity Tolerance Patient tolerated treatment well    Behavior During Therapy WFL for tasks assessed/performed             Past Medical History:  Diagnosis Date   Allergy    Arthritis    psoriatic   Back pain    Cataract    Degenerative disc disease    Diverticulitis    GERD (gastroesophageal reflux disease)    Hypertension    Hypothyroidism    Psoriasis    Reflux    Seizures ([place])    last seizure about 10 years ago   Stroke [facility])    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   Thalassemia    Thyroid disease    Past Surgical History:  Procedure Laterality Date   BACK SURGERY     screws and plate in c4 and c5   CATARACT EXTRACTION W/PHACO Left 05/15/2015   Procedure: CATARACT EXTRACTION PHACO AND INTRAOCULAR LENS PLACEMENT ([place]);  Surgeon: Lyla Glassing, MD;  Location: [facility] ORS;  Service: Ophthalmology;  Laterality: Left;  [place]: 2:40.9 AP: 17.3 CDE: 27.88   CERVICAL FUSION     COLONOSCOPY WITH PROPOFOL N/A 06/14/2016   Procedure: COLONOSCOPY WITH PROPOFOL;  Surgeon: Manya Silvas, MD;  Location: [facility] ENDOSCOPY;  Service: Endoscopy;  Laterality: N/A;   COLONOSCOPY WITH PROPOFOL N/A 03/15/2022   Procedure: COLONOSCOPY WITH PROPOFOL;  Surgeon: [name], DO;  Location: [facility] ENDOSCOPY;  Service: Gastroenterology;  Laterality: N/A;   CORRECTION HAMMER TOE     ESOPHAGOGASTRODUODENOSCOPY N/A 06/11/2022   Procedure: ESOPHAGOGASTRODUODENOSCOPY (EGD);  Surgeon: [name], DO;  Location: [facility] ENDOSCOPY;  Service: Gastroenterology;  Laterality: N/A;   ESOPHAGOGASTRODUODENOSCOPY (EGD) WITH PROPOFOL N/A 03/15/2022   Procedure: ESOPHAGOGASTRODUODENOSCOPY (EGD) WITH PROPOFOL;  Surgeon: [name], DO;  Location: [place];  Service: Gastroenterology;  Laterality: N/A;   EYE SURGERY Right    cataract   FRACTURE SURGERY     HIP FRACTURE SURGERY Right    pins in hip   JOINT REPLACEMENT     KNEE ARTHROPLASTY Left    total knee replacement   Patient Active Problem List   Diagnosis Date Noted   Syncope 11/05/2019   C2 cervical fracture ([place]) 09/14/2018   Hypertension 03/08/2018   GERD (gastroesophageal reflux disease) 03/08/2018   Thyroid disease 03/08/2018   Degenerative disc disease 03/08/2018   Psoriatic arthritis ([place]) 03/08/2018   Back pain 03/08/2018   Thalassemia minor 03/08/2018   Seizures ([place]) 03/08/2018   H/O cataract 03/08/2018   Seasonal allergies 03/08/2018    REFERRING DIAG: Imbalance   THERAPY DIAG:  Difficulty in walking, not elsewhere classified  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 Ladona Ridgel    HPI The patient is a 71 year old gentleman followed by Dr. Joya Salm at [facility] neurosurgery and  presents today for follow-up of low back pain with radiation into the left anterior thigh consistent with an L3 radiculitis. His symptoms began around the fall 2016 without preceding injury or trauma. His symptoms are worsened with heavy lifting, bending, twisting, and occasionally with standing. He has some relief with sitting and rest. Chiropractic manipulation with Dr. Laveda Abbe often provides moderate to good relief. Medications have included Mobic 7.5 mg twice daily as needed, Flexeril 5 mg as needed, and an occasional hydrocodone. He prefers to avoid narcotics if possible.   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 He is now on permanent disability.    He was last evaluated on 06/27/2020 at which time he was experiencing left low back pain rating to the left anterior thigh consistent with an L3 radiculitis for which he was scheduled for another epidural steroid injection. He was to continue with meloxicam and referred for balance therapy at [facility].       At today's visit ports that he has experienced a flare of pain radiating from the left low back into the left anterior thigh stopping at the knee over the last several months and is hopeful to schedule for another epidural steroid injection. He continues meloxicam. He also continues to experience some imbalance and states that he never heard from [facility] for balance therapy I will refer him today. On exam today he is without any noted weakness in the left lower extremity.   PRECAUTIONS: FALLS  SUBJECTIVE: Pt reports that he has no falls since last session and that he had increased back pain yesterday.  PAIN:  Are you having pain? 4-5 in anterior portion of left knee    OBJECTIVE:   VITALS: BP 134/88 SpO2 97 HR 106      DIAGNOSTIC FINDINGS: CLINICAL DATA:  71 year old male status post fall backwards with subsequent weakness.   EXAM: MRI LUMBAR SPINE WITHOUT CONTRAST   TECHNIQUE: Multiplanar, multisequence MR imaging of the lumbar spine was performed. No intravenous contrast was administered.   COMPARISON:  Lumbar MRI 05/17/2019. Lumbar spine CT 10/10/2017.   FINDINGS: Segmentation: Transitional anatomy. For the purposes of this report hypoplastic or absent ribs are designated at T12 (full size ribs at T11) resulting in the lowest open disc space at L5-S1, and this is the same numbering system used on the June MRI. Correlation with radiographs is recommended prior to any operative intervention.   Alignment: Stable since June. Moderate chronic levoconvex lumbar scoliosis with superimposed mild grade 1  anterolisthesis of L4 on L5.   Vertebrae: Chronic T12 compression fracture is stable with mild-to-moderate loss of vertebral height. Superimposed mild left L1 superior endplate compression or large Schmorl's node. Additional chronic endplate changes in the lower lumbar spine. No marrow edema or evidence of acute osseous abnormality. Normal background bone marrow signal. Stable and benign appearing chronic cystic appearing bone lesion in the medial right iliac on series 14, image 36. Intact visible sacrum and SI joints.   Conus medullaris and cauda equina: Conus extends to the T12-L1 level. No lower spinal cord or conus signal abnormality.   Paraspinal and other soft tissues: Stable since June.   Disc levels:   L1-L2 with no stable visible lower thoracic spine through spinal stenosis. There is multilevel chronic moderate and severe lower thoracic and upper lumbar neural foraminal stenosis greater on the right.   L2-L3: Moderate multifactorial spinal stenosis with severe right lateral recess stenosis is stable related to rightward disc osteophyte complex and  posterior element hypertrophy. Mild left and severe right L2 foraminal stenosis is stable.   L3-L4: Moderate to severe multifactorial spinal and right lateral recess stenosis is stable related to circumferential disc osteophyte complex and moderate to severe posterior element hypertrophy. Moderate to severe left and moderate right L3 foraminal stenosis is stable.   L4-L5: Moderate to severe chronic spinal and left greater than right lateral recess stenosis appears stable in the setting of grade 1 anterolisthesis. A small subligamentous synovial cyst has developed on the left (series 11, image 27), although does not exacerbate the stenosis at this time. Moderate to severe left and mild right L4 foraminal stenosis is stable.   L5-S1: Left side assimilation joint with left far lateral disc osteophyte complex and moderate  posterior element hypertrophy again noted. No spinal or lateral recess stenosis. Moderate to severe left and mild right L5 foraminal stenosis is stable.   IMPRESSION: 1. Stable MRI appearance of the lumbar spine since June. 2. Transitional anatomy. Correlation with radiographs is recommended prior to any operative intervention. 3. Chronic levoconvex lumbar scoliosis and grade 1 anterolisthesis at L4-L5 with widespread advanced disc, endplate, and posterior element degeneration. 4. Moderate to severe multifactorial spinal and lateral recess stenosis is stable from L2-L3 through L4-L5. 5. Widespread lower thoracic and lumbar moderate to severe neural foraminal stenosis is stable.     Electronically Signed   By: Genevie Ann M.D.   On: 11/05/2019 02:28   PATIENT SURVEYS:  FOTO 47/51   COGNITION:           Overall cognitive status: Within functional limits for tasks assessed                          SENSATION: Light touch: Impaired on RLE and RUE    MUSCLE LENGTH: Deferred to next visit  Hamstrings: Right NT deg; Left NT deg Marcello Moores test: Right NT deg; Left NT deg   POSTURE:  Left sided trunk  lean    PALPATION: N/a   LE ROM:     Active  Right 03/29/2022 Left 03/29/2022  Hip flexion 120 120  Hip extension 30 30  Hip abduction 45 45  Hip adduction 30 30  Hip internal rotation      Hip external rotation      Knee flexion 135 135  Knee extension 0 0  Ankle dorsiflexion 5 20  Ankle plantarflexion 50 50  Ankle inversion 35 35  Ankle eversion 15 15   (Blank rows = not tested)      LE MMT:   MMT Right 04/23/2022 Left 04/23/2022  Hip flexion 5 5  Hip extension      Hip abduction      Hip adduction      Hip internal rotation      Hip external rotation      Knee flexion 5 5  Knee extension 5 5  Ankle dorsiflexion 2+ 5  Ankle plantarflexion 5 5  Ankle inversion      Ankle eversion       (Blank rows = not tested)      LOWER EXTREMITY SPECIAL TESTS: NT      FUNCTIONAL TESTS:    5 Times Sit to Stand: 0 reps  (Individuals with times that exceed the listed time have worse than average performance - predictive of recurrent falls in healthy community-living subjects)         - 60-69 y.o. 11.4 sec         -  94-79 y.o. 12.6 sec         - 80-89 y.o. 14.8 sec                 30 sec chair stands: 0 reps   <12 reps for Males 70-74   *A below average score indicates a risk for falls   STEADI 4-Stage Balance Test: (inability to maintain tandem stance for 10 sec = increased risk for falls) - feet together, eyes open, noncompliant surface: 10/10 sec - semi-tandem stance, eyes open, noncompliant surface: 10/10 sec - tandem stances, eyes open, noncompliant surface: 3/ 0 sec - single leg stance: NT        GAIT: Distance walked: Right sided hemiplegia, decreased stance time on RLE and decreased step length on LLE  Assistive device utilized: None Level of assistance: Complete Independence Comments: H/o of left sided CVA as a child        TODAY'S TREATMENT:  06/14/22   Nu-Step 5 min with seat and arms at 10 and resistance at 2 - 5 min  Sit to Stand from 18 inch mat height 3 x 10 with use of 1 UE  Prone Prop Ups on Elbows 3 x 30 secs Modified Damond Stretch 3 x 30 sec   NMR   Romberg Forwad and Backward Leans 3 x 10  -min VC to increase lumbar extension                        06/02/22  THEREX   Nu-Step Seat level and arm length 11 for 5 min Standing Heel Raises with BUE support  3 x 10    NMR   Step on and off 4 inch platform x 2 for 27 ft x 6 with SPC   Step over obstacles 1 ft x 5 for 27 ft x 6 with SPC  Figure 8s x 3 cones for 27 ft x 10  Figure 8s x 3 cones for 27 ft x 6  -min VC to recall numbers on PT's hand to prompt patient to look up while walking    05/31/22   THEREX:  Nu-Step Seat level and arm length 11 for 5 min   Supine Sit and Reaches/Abdominal Crunches 3 x 10 -min VC to inhale as he reaches up and to  exhale as he goes down   Sit to Stand 3 x 10  -min VC to increase stand up speed and decrease eccentric phase  -min VC to place nose over toes   NMR:  10 m x 4 horz head turns with SPC  10 m x 4 vert head turns with SPC  10 m x 6 vert + horz head turns with [facility]   05/25/22  Nu-Step Seat level 11 and arm length 11 for 6 min  Sit to Stand with 1 UE 3 x 10  -min to mod VC to decrease speed of sitting or eccentric portion of exercise  Abdominal Crunches 3 x 10    PATIENT EDUCATION:  Education details: Form and technique for appropriate exercise  Person educated: Patient Education method: Explanation Education comprehension: verbalized understanding, returned demonstration, and verbal cues required     HOME EXERCISE PROGRAM: Access Code: [email or web address] URL: [email or web address] Date: 06/14/2022 Prepared by: Bradly Chris  Exercises - Sit to Stand with Counter Support  - 1 x daily - 3 x weekly - 3 sets - 5 reps - Seated Sciatic Tensioner  - 1 x daily - 7 x weekly -  1 sets - 10 reps - Prone Quadriceps Stretch with Strap  - 1 x daily - 7 x weekly - 1 sets - 3 reps - 30 hold - Ab Prep  - 1 x daily - 3 x weekly - 3 sets - 10 reps - Standing Heel Raise with Support  - 1 x daily - 3 x weekly - 3 sets - 10 reps - Forward Bending Hip Flexion with Flat Lumbar Spine  - 1 x daily - 3 x weekly - 3 sets - 10 reps - Prone Press Up On Elbows  - 1 x daily - 7 x weekly - 1 sets - 3 reps - 30 hold - Supine Dynamic Modified Lary Quad and Hip Flexor Dynamic Stretch  - 1 x daily - 7 x weekly - 1 sets - 3 reps - 30 hold   ASSESSMENT:   CLINICAL IMPRESSION:  Pt continues to exhibit significant muscle fatigue with maintaining upright posture for static and dynamic balance exercises likely due to kyphotic posture. Exercises modified to include abdominal, chest, and hip flexor stretches to reduce muscular tension and increase ease with upright posture. Pt's increased SOB is likely due to  deconditioning as he only displays this when performing exercises and he is not experiencing this at baseline. He will continue to benefit from skilled PT to improve static and dynamic balance and LE strength to decrease his risk for falling.    OBJECTIVE IMPAIRMENTS Abnormal gait, decreased balance, difficulty walking, decreased ROM, decreased strength, impaired flexibility, impaired sensation, and pain.    ACTIVITY LIMITATIONS shopping and community activity.    PERSONAL FACTORS Fitness, Past/current experiences, Time since onset of injury/illness/exacerbation, and 3+ comorbidities: h/o stroke, psoriatic arthritis, degenerative disc disease  are also affecting patient's functional outcome.      REHAB POTENTIAL: Fair chronicity of right hemiparesis and reduced familial and social support    CLINICAL DECISION MAKING: Stable/uncomplicated   EVALUATION COMPLEXITY: Low     GOALS: Goals reviewed with patient? No   SHORT TERM GOALS: Target date: 05/07/2022     Pt will be independent with HEP in order to improve strength and balance in order to decrease fall risk and improve function at home and work. Baseline: N/a Goal status: ONGOING    2.  Patient will perform sit to stand without use of UE support to demonstrate improve LE strength.  Baseline: Unable to perform without use of hands  Goal status: ONGOING        LONG TERM GOALS: Target date: 06/18/2022  (Remove Blue Hyperlink)   Patient will have improved function and activity level as evidenced by an increase in FOTO score to predicted score. Baseline: 47/51 Goal status: ONGOING    2.  Patient will improve 5 x STS to <11.4 sec to demonstrate LE strength that places him at a decreased risk for falls. Baseline: Unable to perform because needs to use UE support  Goal status: ONGOING    3. Patient will hold tandem stance for >=10 sec to exhibit improved static balance to decrease his risk of falling.   Baseline: Tandem stance R/L 3  sec/0 sec   Goal status: ONGOING    4.  Patient will improve his FGA score my 5 points to demonstrate a minimal significant improvement in his dynamic balance thus decreasing his risk for falling.  Baseline: 13/30 Goal status: ONGOING        PLAN: PT FREQUENCY: 1-2x/week   PT DURATION: 8 weeks   PLANNED INTERVENTIONS: Therapeutic  exercises, Therapeutic activity, Neuromuscular re-education, Balance training, Gait training, Patient/Family education, Joint manipulation, Joint mobilization, Stair training, Orthotic/Fit training, DME instructions, Aquatic Therapy, Dry Needling, Cryotherapy, Moist heat, Manual therapy, and Re-evaluation   PLAN FOR NEXT SESSION: Reassess goals. Dynamic balance and walking sessions; backward walking and obstacles. Progress LE strength and static balance exercises.    Bradly Chris PT, DPT 06/14/2022, 5:24 PM

## 2022-06-16 ENCOUNTER — Encounter: Payer: Self-pay | Admitting: Physical Therapy

## 2022-06-16 ENCOUNTER — Ambulatory Visit: Payer: Medicare Other | Admitting: Physical Therapy

## 2022-06-16 DIAGNOSIS — R262 Difficulty in walking, not elsewhere classified: Secondary | ICD-10-CM | POA: Diagnosis not present

## 2022-06-16 DIAGNOSIS — I6930 Unspecified sequelae of cerebral infarction: Secondary | ICD-10-CM

## 2022-06-16 NOTE — Therapy (Signed)
OUTPATIENT PHYSICAL THERAPY PROGRESS NOTE/ Re-Certification   Episode of Reporting: 04/23/22- 06/17/22   Patient Name: Anthony Evans MRN: [id number] DOB:May 28, 1951, 71 y.o., male Today's Date: 06/16/2022  PCP: Dr. Tracie Harrier  REFERRING PROVIDER: Dr. Allene Dillon   END OF SESSION:   PT End of Session - 06/16/22 1243     Visit Number 10    Number of Visits 16    Date for PT Re-Evaluation 06/17/22    Authorization Type [employer] Medicare    PT Start Time 8341    PT Stop Time 1315    PT Time Calculation (min) 40 min    Equipment Utilized During Treatment Gait belt    Activity Tolerance Patient tolerated treatment well    Behavior During Therapy WFL for tasks assessed/performed             Past Medical History:  Diagnosis Date   Allergy    Arthritis    psoriatic   Back pain    Cataract    Degenerative disc disease    Diverticulitis    GERD (gastroesophageal reflux disease)    Hypertension    Hypothyroidism    Psoriasis    Reflux    Seizures ([place])    last seizure about 10 years ago   Stroke [facility])    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   Thalassemia    Thyroid disease    Past Surgical History:  Procedure Laterality Date   BACK SURGERY     screws and plate in c4 and c5   CATARACT EXTRACTION W/PHACO Left 05/15/2015   Procedure: CATARACT EXTRACTION PHACO AND INTRAOCULAR LENS PLACEMENT ([place]);  Surgeon: Lyla Glassing, MD;  Location: [facility] ORS;  Service: Ophthalmology;  Laterality: Left;  [place]: 2:40.9 AP: 17.3 CDE: 27.88   CERVICAL FUSION     COLONOSCOPY WITH PROPOFOL N/A 06/14/2016   Procedure: COLONOSCOPY WITH PROPOFOL;  Surgeon: Manya Silvas, MD;  Location: [facility] ENDOSCOPY;  Service: Endoscopy;  Laterality: N/A;   COLONOSCOPY WITH PROPOFOL N/A 03/15/2022   Procedure: COLONOSCOPY WITH PROPOFOL;  Surgeon: [name], DO;  Location: [facility] ENDOSCOPY;  Service: Gastroenterology;  Laterality: N/A;   CORRECTION HAMMER TOE     ESOPHAGOGASTRODUODENOSCOPY N/A 06/11/2022    Procedure: ESOPHAGOGASTRODUODENOSCOPY (EGD);  Surgeon: [name], DO;  Location: [facility] ENDOSCOPY;  Service: Gastroenterology;  Laterality: N/A;   ESOPHAGOGASTRODUODENOSCOPY (EGD) WITH PROPOFOL N/A 03/15/2022   Procedure: ESOPHAGOGASTRODUODENOSCOPY (EGD) WITH PROPOFOL;  Surgeon: [name], DO;  Location: [place];  Service: Gastroenterology;  Laterality: N/A;   EYE SURGERY Right    cataract   FRACTURE SURGERY     HIP FRACTURE SURGERY Right    pins in hip   JOINT REPLACEMENT     KNEE ARTHROPLASTY Left    total knee replacement   Patient Active Problem List   Diagnosis Date Noted   Syncope 11/05/2019   C2 cervical fracture ([place]) 09/14/2018   Hypertension 03/08/2018   GERD (gastroesophageal reflux disease) 03/08/2018   Thyroid disease 03/08/2018   Degenerative disc disease 03/08/2018   Psoriatic arthritis ([place]) 03/08/2018   Back pain 03/08/2018   Thalassemia minor 03/08/2018   Seizures ([place]) 03/08/2018   H/O cataract 03/08/2018   Seasonal allergies 03/08/2018    REFERRING DIAG: Imbalance   THERAPY DIAG:  Difficulty in walking, not elsewhere classified  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 Ladona Ridgel    HPI The patient is a 71 year old gentleman  followed by Dr. Joya Salm at [facility] and presents today for follow-up of low back pain with radiation into the left anterior thigh consistent with an L3 radiculitis. His symptoms began around the fall 2016 without preceding injury or trauma. His symptoms are worsened with heavy lifting, bending, twisting, and occasionally with standing. He has some relief with sitting and rest. Chiropractic manipulation with Dr. Laveda Abbe often provides moderate to good relief. Medications have included Mobic 7.5 mg twice daily as needed, Flexeril 5 mg as needed, and an occasional hydrocodone. He prefers to  avoid narcotics if possible.   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 He is now on permanent disability.    He was last evaluated on 06/27/2020 at which time he was experiencing left low back pain rating to the left anterior thigh consistent with an L3 radiculitis for which he was scheduled for another epidural steroid injection. He was to continue with meloxicam and referred for balance therapy at [facility].       At today's visit ports that he has experienced a flare of pain radiating from the left low back into the left anterior thigh stopping at the knee over the last several months and is hopeful to schedule for another epidural steroid injection. He continues meloxicam. He also continues to experience some imbalance and states that he never heard from [facility] for balance therapy I will refer him today. On exam today he is without any noted weakness in the left lower extremity.   PRECAUTIONS: FALLS  SUBJECTIVE: Pt reports no falls since last session. He says he felt more LBP yesterday and he attributes this to incoming storm.   PAIN:  Are you having pain? No pain   OBJECTIVE:   VITALS: BP 134/88 SpO2 97 HR 106      DIAGNOSTIC FINDINGS: CLINICAL DATA:  71 year old male status post fall backwards with subsequent weakness.   EXAM: MRI LUMBAR SPINE WITHOUT CONTRAST   TECHNIQUE: Multiplanar, multisequence MR imaging of the lumbar spine was performed. No intravenous contrast was administered.   COMPARISON:  Lumbar MRI 05/17/2019. Lumbar spine CT 10/10/2017.   FINDINGS: Segmentation: Transitional anatomy. For the purposes of this report hypoplastic or absent ribs are designated at T12 (full size ribs at T11) resulting in the lowest open disc space at L5-S1, and this is the same numbering system used on the June MRI. Correlation with radiographs is recommended prior to any operative intervention.   Alignment: Stable since June. Moderate chronic levoconvex  lumbar scoliosis with superimposed mild grade 1 anterolisthesis of L4 on L5.   Vertebrae: Chronic T12 compression fracture is stable with mild-to-moderate loss of vertebral height. Superimposed mild left L1 superior endplate compression or large Schmorl's node. Additional chronic endplate changes in the lower lumbar spine. No marrow edema or evidence of acute osseous abnormality. Normal background bone marrow signal. Stable and benign appearing chronic cystic appearing bone lesion in the medial right iliac on series 14, image 36. Intact visible sacrum and SI joints.   Conus medullaris and cauda equina: Conus extends to the T12-L1 level. No lower spinal cord or conus signal abnormality.   Paraspinal and other soft tissues: Stable since June.   Disc levels:   L1-L2 with no stable visible lower thoracic spine through spinal stenosis. There is multilevel chronic moderate and severe lower thoracic and upper lumbar neural foraminal stenosis greater on the right.   L2-L3: Moderate multifactorial spinal stenosis with severe right lateral recess stenosis is stable related  to rightward disc osteophyte complex and posterior element hypertrophy. Mild left and severe right L2 foraminal stenosis is stable.   L3-L4: Moderate to severe multifactorial spinal and right lateral recess stenosis is stable related to circumferential disc osteophyte complex and moderate to severe posterior element hypertrophy. Moderate to severe left and moderate right L3 foraminal stenosis is stable.   L4-L5: Moderate to severe chronic spinal and left greater than right lateral recess stenosis appears stable in the setting of grade 1 anterolisthesis. A small subligamentous synovial cyst has developed on the left (series 11, image 27), although does not exacerbate the stenosis at this time. Moderate to severe left and mild right L4 foraminal stenosis is stable.   L5-S1: Left side assimilation joint with left far  lateral disc osteophyte complex and moderate posterior element hypertrophy again noted. No spinal or lateral recess stenosis. Moderate to severe left and mild right L5 foraminal stenosis is stable.   IMPRESSION: 1. Stable MRI appearance of the lumbar spine since June. 2. Transitional anatomy. Correlation with radiographs is recommended prior to any operative intervention. 3. Chronic levoconvex lumbar scoliosis and grade 1 anterolisthesis at L4-L5 with widespread advanced disc, endplate, and posterior element degeneration. 4. Moderate to severe multifactorial spinal and lateral recess stenosis is stable from L2-L3 through L4-L5. 5. Widespread lower thoracic and lumbar moderate to severe neural foraminal stenosis is stable.     Electronically Signed   By: Genevie Ann M.D.   On: 11/05/2019 02:28   PATIENT SURVEYS:  FOTO 47/51   COGNITION:           Overall cognitive status: Within functional limits for tasks assessed                          SENSATION: Light touch: Impaired on RLE and RUE    MUSCLE LENGTH: Deferred to next visit  Hamstrings: Right NT deg; Left NT deg Marcello Moores test: Right NT deg; Left NT deg   POSTURE:  Left sided trunk  lean    PALPATION: N/a   LE ROM:     Active  Right 04/23/2022 Left 04/23/2022  Hip flexion 120 120  Hip extension 30 30  Hip abduction 45 45  Hip adduction 30 30  Hip internal rotation      Hip external rotation      Knee flexion 135 135  Knee extension 0 0  Ankle dorsiflexion 5 20  Ankle plantarflexion 50 50  Ankle inversion 35 35  Ankle eversion 15 15   (Blank rows = not tested)      LE MMT:   MMT Right 04/23/2022 Left 04/23/2022  Hip flexion 5 5  Hip extension      Hip abduction      Hip adduction      Hip internal rotation      Hip external rotation      Knee flexion 5 5  Knee extension 5 5  Ankle dorsiflexion 2+ 5  Ankle plantarflexion 5 5  Ankle inversion      Ankle eversion       (Blank rows = not tested)       LOWER EXTREMITY SPECIAL TESTS: NT     FUNCTIONAL TESTS:    5 Times Sit to Stand: 0 reps  (Individuals with times that exceed the listed time have worse than average performance - predictive of recurrent falls in healthy community-living subjects)         - 77-69 y.o. 11.4  sec         - 60-79 y.o. 12.6 sec         - 80-89 y.o. 14.8 sec                 30 sec chair stands: 0 reps   <12 reps for Males 70-74   *A below average score indicates a risk for falls   STEADI 4-Stage Balance Test: (inability to maintain tandem stance for 10 sec = increased risk for falls) - feet together, eyes open, noncompliant surface: 10/10 sec - semi-tandem stance, eyes open, noncompliant surface: 10/10 sec - tandem stances, eyes open, noncompliant surface: 3/ 0 sec - single leg stance: NT        GAIT: Distance walked: Right sided hemiplegia, decreased stance time on RLE and decreased step length on LLE  Assistive device utilized: None Level of assistance: Complete Independence Comments: H/o of left sided CVA as a child        TODAY'S TREATMENT:  06/16/22            Nu-Step 6 min with seat and arms at 11 and resistance at 2             Prone Prop Up on Elbows 3 x 30 sec             Modified Antron Stretch with #5 AW to increase stretch 3 x 30 sec             5 STS: 2 reps            Tandem Stance R/L 10 sec/ 8 sec (not able to achieve full tandem)    06/14/22   Nu-Step 5 min with seat and arms at 10 and resistance at 2 - 5 min  Sit to Stand from 18 inch mat height 3 x 10 with use of 1 UE  Prone Prop Ups on Elbows 3 x 30 secs Modified Eyoel Stretch 3 x 30 sec   NMR   Romberg Forwad and Backward Leans 3 x 10  -min VC to increase lumbar extension                        06/02/22  THEREX   Nu-Step Seat level and arm length 11 for 5 min Standing Heel Raises with BUE support  3 x 10    NMR   Step on and off 4 inch platform x 2 for 27 ft x 6 with SPC   Step over obstacles 1  ft x 5 for 27 ft x 6 with SPC  Figure 8s x 3 cones for 27 ft x 10  Figure 8s x 3 cones for 27 ft x 6  -min VC to recall numbers on PT's hand to prompt patient to look up while walking    05/31/22   THEREX:  Nu-Step Seat level and arm length 11 for 5 min   Supine Sit and Reaches/Abdominal Crunches 3 x 10 -min VC to inhale as he reaches up and to exhale as he goes down   Sit to Stand 3 x 10  -min VC to increase stand up speed and decrease eccentric phase  -min VC to place nose over toes   NMR:  10 m x 4 horz head turns with SPC  10 m x 4 vert head turns with SPC  10 m x 6 vert + horz head turns with [facility]    PATIENT EDUCATION:  Education details: Form  and technique for appropriate exercise  Person educated: Patient Education method: Explanation Education comprehension: verbalized understanding, returned demonstration, and verbal cues required     HOME EXERCISE PROGRAM: Access Code: [email or web address] URL: [email or web address] Date: 06/14/2022 Prepared by: Bradly Chris  Exercises - Sit to Stand with Counter Support  - 1 x daily - 3 x weekly - 3 sets - 5 reps - Seated Sciatic Tensioner  - 1 x daily - 7 x weekly - 1 sets - 10 reps - Prone Quadriceps Stretch with Strap  - 1 x daily - 7 x weekly - 1 sets - 3 reps - 30 hold - Ab Prep  - 1 x daily - 3 x weekly - 3 sets - 10 reps - Standing Heel Raise with Support  - 1 x daily - 3 x weekly - 3 sets - 10 reps - Forward Bending Hip Flexion with Flat Lumbar Spine  - 1 x daily - 3 x weekly - 3 sets - 10 reps - Prone Press Up On Elbows  - 1 x daily - 7 x weekly - 1 sets - 3 reps - 30 hold - Supine Dynamic Modified Jary Quad and Hip Flexor Dynamic Stretch  - 1 x daily - 7 x weekly - 1 sets - 3 reps - 30 hold   ASSESSMENT:   CLINICAL IMPRESSION:  Pt exhibits increased LE strength and improved static balance with ability to perform sit to stand without use of UE and ability to maintain near tandem stance for close to 10 sec  on each leg. Unable to finish assessing FGA goal because of time and will do next session. He will continue to benefit from skilled PT to improve static and dynamic balance and LE strength to decrease his risk for falling.   OBJECTIVE IMPAIRMENTS Abnormal gait, decreased balance, difficulty walking, decreased ROM, decreased strength, impaired flexibility, impaired sensation, and pain.    ACTIVITY LIMITATIONS shopping and community activity.    PERSONAL FACTORS Fitness, Past/current experiences, Time since onset of injury/illness/exacerbation, and 3+ comorbidities: h/o stroke, psoriatic arthritis, degenerative disc disease  are also affecting patient's functional outcome.      REHAB POTENTIAL: Fair chronicity of right hemiparesis and reduced familial and social support    CLINICAL DECISION MAKING: Stable/uncomplicated   EVALUATION COMPLEXITY: Low     GOALS: Goals reviewed with patient? No   SHORT TERM GOALS: Target date: 05/07/2022     Pt will be independent with HEP in order to improve strength and balance in order to decrease fall risk and improve function at home and work. Baseline: n/A 06/16/22: Performing exercises independently  Goal status: ONGoing   2.  Patient will perform sit to stand without use of UE support to demonstrate improve LE strength.  Baseline: Unable to perform without use of hands 06/16/22: Able to perform without use of UE Goal status: ACHIEVED        LONG TERM GOALS: Target date: 06/18/2022     Patient will have improved function and activity level as evidenced by an increase in FOTO score to predicted score. Baseline: 47/51 Goal status: ONGOING    2.  Patient will improve 5 x STS to <11.4 sec to demonstrate LE strength that places him at a decreased risk for falls. Baseline: Unable to perform because needs to use UE support 06/16/22: Only able to perform 2  Goal status: Partially met    3. Patient will hold tandem stance for >=10 sec to exhibit improved  static balance to decrease  his risk of falling.   Baseline: Tandem stance R/L 3 sec/0 sec  06/16/22: Tandem R/L 10 sec /8 sec; unable to perform full tandem with RLE as back foot  Goal status: Partially met    4.  Patient will improve his FGA score my 5 points to demonstrate a minimal significant improvement in his dynamic balance thus decreasing his risk for falling.  Baseline: 13/30 Goal status: ONGOING        PLAN: PT FREQUENCY: 1-2x/week   PT DURATION: 8 weeks   PLANNED INTERVENTIONS: Therapeutic exercises, Therapeutic activity, Neuromuscular re-education, Balance training, Gait training, Patient/Family education, Joint manipulation, Joint mobilization, Stair training, Orthotic/Fit training, DME instructions, Aquatic Therapy, Dry Needling, Cryotherapy, Moist heat, Manual therapy, and Re-evaluation   PLAN FOR NEXT SESSION:  FGA, Dynamic balance and walking sessions; backward walking and obstacles. Progress LE strength and static balance exercises.    Bradly Chris PT, DPT 06/16/2022, 12:44 PM

## 2022-06-21 ENCOUNTER — Ambulatory Visit: Payer: Medicare Other | Admitting: Physical Therapy

## 2022-06-21 ENCOUNTER — Encounter: Payer: Self-pay | Admitting: Physical Therapy

## 2022-06-21 DIAGNOSIS — I6930 Unspecified sequelae of cerebral infarction: Secondary | ICD-10-CM

## 2022-06-21 DIAGNOSIS — R262 Difficulty in walking, not elsewhere classified: Secondary | ICD-10-CM

## 2022-06-21 NOTE — Therapy (Addendum)
OUTPATIENT PHYSICAL THERAPY TREATMENT  NOTE  Patient Name: Anthony Evans MRN: [id number] DOB:05-21-1951, 71 y.o., male Today's Date: 06/21/2022  PCP: Dr. Tracie Harrier  REFERRING PROVIDER: Dr. Allene Dillon   END OF SESSION:    PT End of Session - 06/21/22 0001     Visit Number 11    Number of Visits 16    Date for PT Re-Evaluation 08/18/22    Authorization Type [employer] Medicare    PT Start Time 1630    PT Stop Time 1715    PT Time Calculation (min) 45 min    Equipment Utilized During Treatment Gait belt    Activity Tolerance Patient tolerated treatment well    Behavior During Therapy WFL for tasks assessed/performed              Past Medical History:  Diagnosis Date   Allergy    Arthritis    psoriatic   Back pain    Cataract    Degenerative disc disease    Diverticulitis    GERD (gastroesophageal reflux disease)    Hypertension    Hypothyroidism    Psoriasis    Reflux    Seizures ([place])    last seizure about 10 years ago   Stroke [facility])    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   Thalassemia    Thyroid disease    Past Surgical History:  Procedure Laterality Date   BACK SURGERY     screws and plate in c4 and c5   CATARACT EXTRACTION W/PHACO Left 05/15/2015   Procedure: CATARACT EXTRACTION PHACO AND INTRAOCULAR LENS PLACEMENT ([place]);  Surgeon: Lyla Glassing, MD;  Location: [facility] ORS;  Service: Ophthalmology;  Laterality: Left;  [place]: 2:40.9 AP: 17.3 CDE: 27.88   CERVICAL FUSION     COLONOSCOPY WITH PROPOFOL N/A 06/14/2016   Procedure: COLONOSCOPY WITH PROPOFOL;  Surgeon: Manya Silvas, MD;  Location: [facility] ENDOSCOPY;  Service: Endoscopy;  Laterality: N/A;   COLONOSCOPY WITH PROPOFOL N/A 03/15/2022   Procedure: COLONOSCOPY WITH PROPOFOL;  Surgeon: [name], DO;  Location: [facility] ENDOSCOPY;  Service: Gastroenterology;  Laterality: N/A;   CORRECTION HAMMER TOE     ESOPHAGOGASTRODUODENOSCOPY N/A 06/11/2022   Procedure: ESOPHAGOGASTRODUODENOSCOPY (EGD);  Surgeon: [name], DO;  Location: [facility] ENDOSCOPY;  Service: Gastroenterology;  Laterality: N/A;   ESOPHAGOGASTRODUODENOSCOPY (EGD) WITH PROPOFOL N/A 03/15/2022   Procedure: ESOPHAGOGASTRODUODENOSCOPY (EGD) WITH PROPOFOL;  Surgeon: [name], DO;  Location: [facility];  Service: Gastroenterology;  Laterality: N/A;   EYE SURGERY Right    cataract   FRACTURE SURGERY     HIP FRACTURE SURGERY Right    pins in hip   JOINT REPLACEMENT     KNEE ARTHROPLASTY Left    total knee replacement   Patient Active Problem List   Diagnosis Date Noted   Syncope 11/05/2019   C2 cervical fracture ([place]) 09/14/2018   Hypertension 03/08/2018   GERD (gastroesophageal reflux disease) 03/08/2018   Thyroid disease 03/08/2018   Degenerative disc disease 03/08/2018   Psoriatic arthritis ([place]) 03/08/2018   Back pain 03/08/2018   Thalassemia minor 03/08/2018   Seizures ([place]) 03/08/2018   H/O cataract 03/08/2018   Seasonal allergies 03/08/2018    REFERRING DIAG: Imbalance   THERAPY DIAG:  Difficulty in walking, not elsewhere classified  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 Ladona Ridgel    HPI The patient is a 71 year old gentleman followed by Dr. Joya Salm at [place]  [facility] and presents today for follow-up of low back pain with radiation into the left anterior thigh consistent with an L3 radiculitis. His symptoms began around the fall 2016 without preceding injury or trauma. His symptoms are worsened with heavy lifting, bending, twisting, and occasionally with standing. He has some relief with sitting and rest. Chiropractic manipulation with Dr. Laveda Abbe often provides moderate to good relief. Medications have included Mobic 7.5 mg twice daily as needed, Flexeril 5 mg as needed, and an occasional hydrocodone. He prefers to avoid narcotics if possible.   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 He is now on permanent disability.    He was last evaluated on 06/27/2020 at which time he was experiencing left low back pain rating to the left anterior thigh consistent with an L3 radiculitis for which he was scheduled for another epidural steroid injection. He was to continue with meloxicam and referred for balance therapy at [facility].       At today's visit ports that he has experienced a flare of pain radiating from the left low back into the left anterior thigh stopping at the knee over the last several months and is hopeful to schedule for another epidural steroid injection. He continues meloxicam. He also continues to experience some imbalance and states that he never heard from [facility] for balance therapy I will refer him today. On exam today he is without any noted weakness in the left lower extremity.   PRECAUTIONS: FALLS  SUBJECTIVE: Pt reports no falls since last session. He says he felt more LBP yesterday and he attributes this to incoming storm.   PAIN:  Are you having pain? No pain   OBJECTIVE:   VITALS: BP 134/88 SpO2 97 HR 106      DIAGNOSTIC FINDINGS: CLINICAL DATA:  71 year old male status post fall backwards with subsequent weakness.   EXAM: MRI LUMBAR SPINE WITHOUT CONTRAST   TECHNIQUE: Multiplanar, multisequence MR imaging of the lumbar spine was performed. No intravenous contrast was administered.   COMPARISON:  Lumbar MRI 05/17/2019. Lumbar spine CT 10/10/2017.   FINDINGS: Segmentation: Transitional anatomy. For the purposes of this report hypoplastic or absent ribs are designated at T12 (full size ribs at T11) resulting in the lowest open disc space at L5-S1, and this is the same numbering system used on the June MRI. Correlation with radiographs is recommended prior to any operative intervention.   Alignment: Stable since June. Moderate chronic levoconvex lumbar scoliosis with superimposed mild grade 1 anterolisthesis of L4  on L5.   Vertebrae: Chronic T12 compression fracture is stable with mild-to-moderate loss of vertebral height. Superimposed mild left L1 superior endplate compression or large Schmorl's node. Additional chronic endplate changes in the lower lumbar spine. No marrow edema or evidence of acute osseous abnormality. Normal background bone marrow signal. Stable and benign appearing chronic cystic appearing bone lesion in the medial right iliac on series 14, image 36. Intact visible sacrum and SI joints.   Conus medullaris and cauda equina: Conus extends to the T12-L1 level. No lower spinal cord or conus signal abnormality.   Paraspinal and other soft tissues: Stable since June.   Disc levels:   L1-L2 with no stable visible lower thoracic spine through spinal stenosis. There is multilevel chronic moderate and severe lower thoracic and upper lumbar neural foraminal stenosis greater on the right.   L2-L3: Moderate multifactorial spinal stenosis with severe right lateral recess stenosis is stable related to rightward disc osteophyte complex and  posterior element hypertrophy. Mild left and severe right L2 foraminal stenosis is stable.   L3-L4: Moderate to severe multifactorial spinal and right lateral recess stenosis is stable related to circumferential disc osteophyte complex and moderate to severe posterior element hypertrophy. Moderate to severe left and moderate right L3 foraminal stenosis is stable.   L4-L5: Moderate to severe chronic spinal and left greater than right lateral recess stenosis appears stable in the setting of grade 1 anterolisthesis. A small subligamentous synovial cyst has developed on the left (series 11, image 27), although does not exacerbate the stenosis at this time. Moderate to severe left and mild right L4 foraminal stenosis is stable.   L5-S1: Left side assimilation joint with left far lateral disc osteophyte complex and moderate posterior element  hypertrophy again noted. No spinal or lateral recess stenosis. Moderate to severe left and mild right L5 foraminal stenosis is stable.   IMPRESSION: 1. Stable MRI appearance of the lumbar spine since June. 2. Transitional anatomy. Correlation with radiographs is recommended prior to any operative intervention. 3. Chronic levoconvex lumbar scoliosis and grade 1 anterolisthesis at L4-L5 with widespread advanced disc, endplate, and posterior element degeneration. 4. Moderate to severe multifactorial spinal and lateral recess stenosis is stable from L2-L3 through L4-L5. 5. Widespread lower thoracic and lumbar moderate to severe neural foraminal stenosis is stable.     Electronically Signed   By: Genevie Ann M.D.   On: 11/05/2019 02:28   PATIENT SURVEYS:  FOTO 47/51   COGNITION:           Overall cognitive status: Within functional limits for tasks assessed                          SENSATION: Light touch: Impaired on RLE and RUE    MUSCLE LENGTH: Deferred to next visit  Hamstrings: Right NT deg; Left NT deg Marcello Moores test: Right NT deg; Left NT deg   POSTURE:  Left sided trunk  lean    PALPATION: N/a   LE ROM:     Active  Right 04/23/2022 Left 04/23/2022  Hip flexion 120 120  Hip extension 30 30  Hip abduction 45 45  Hip adduction 30 30  Hip internal rotation      Hip external rotation      Knee flexion 135 135  Knee extension 0 0  Ankle dorsiflexion 5 20  Ankle plantarflexion 50 50  Ankle inversion 35 35  Ankle eversion 15 15   (Blank rows = not tested)      LE MMT:   MMT Right 04/23/2022 Left 04/23/2022  Hip flexion 5 5  Hip extension      Hip abduction      Hip adduction      Hip internal rotation      Hip external rotation      Knee flexion 5 5  Knee extension 5 5  Ankle dorsiflexion 2+ 5  Ankle plantarflexion 5 5  Ankle inversion      Ankle eversion       (Blank rows = not tested)      LOWER EXTREMITY SPECIAL TESTS: NT     FUNCTIONAL TESTS:     5 Times Sit to Stand: 0 reps  (Individuals with times that exceed the listed time have worse than average performance - predictive of recurrent falls in healthy community-living subjects)         - 60-69 y.o. 11.4 sec         -  68-79 y.o. 12.6 sec         - 80-89 y.o. 14.8 sec                 30 sec chair stands: 0 reps   <12 reps for Males 70-74   *A below average score indicates a risk for falls   STEADI 4-Stage Balance Test: (inability to maintain tandem stance for 10 sec = increased risk for falls) - feet together, eyes open, noncompliant surface: 10/10 sec - semi-tandem stance, eyes open, noncompliant surface: 10/10 sec - tandem stances, eyes open, noncompliant surface: 3/ 0 sec - single leg stance: NT        GAIT: Distance walked: Right sided hemiplegia, decreased stance time on RLE and decreased step length on LLE  Assistive device utilized: None Level of assistance: Complete Independence Comments: H/o of left sided CVA as a child        TODAY'S TREATMENT:  06/21/22: Nu-Step Level 11 for seat and arms 5 min  Modified Payam Stretch with #5 AW and overpressure 3 x 60 sec  Sit to Stand with 1 UE support  3 x 10  -min to mod VC to increase depth of sit to make   FGA: 13 -Mild Impairments: Gait with eyes closed, vertical and horizontal head turns, step over obstacle, change in gait speed, gait level surface, gait with pivot turns  -Moderate Impairments: Ambulating Backwards + Stairs    06/16/22            Nu-Step 6 min with seat and arms at 11 and resistance at 2             Prone Prop Up on Elbows 3 x 30 sec             Modified Manjinder Stretch with #5 AW to increase stretch 3 x 30 sec             5 STS: 2 reps            Tandem Stance R/L 10 sec/ 8 sec (not able to achieve full tandem)    06/14/22   Nu-Step 5 min with seat and arms at 10 and resistance at 2 - 5 min  Sit to Stand from 18 inch mat height 3 x 10 with use of 1 UE  Prone Prop Ups on Elbows 3  x 30 secs Modified Nechemia Stretch 3 x 30 sec   NMR   Romberg Forwad and Backward Leans 3 x 10  -min VC to increase lumbar extension                        06/02/22  THEREX   Nu-Step Seat level and arm length 11 for 5 min Standing Heel Raises with BUE support  3 x 10    NMR   Step on and off 4 inch platform x 2 for 27 ft x 6 with SPC   Step over obstacles 1 ft x 5 for 27 ft x 6 with SPC  Figure 8s x 3 cones for 27 ft x 10  Figure 8s x 3 cones for 27 ft x 6  -min VC to recall numbers on PT's hand to prompt patient to look up while walking    05/31/22   THEREX:  Nu-Step Seat level and arm length 11 for 5 min   Supine Sit and Reaches/Abdominal Crunches 3 x 10 -min VC to inhale as he reaches up and to exhale  as he goes down   Sit to Stand 3 x 10  -min VC to increase stand up speed and decrease eccentric phase  -min VC to place nose over toes   NMR:  10 m x 4 horz head turns with SPC  10 m x 4 vert head turns with SPC  10 m x 6 vert + horz head turns with [facility]    PATIENT EDUCATION:  Education details: Form and technique for appropriate exercise  Person educated: Patient Education method: Explanation Education comprehension: verbalized understanding, returned demonstration, and verbal cues required     HOME EXERCISE PROGRAM: Access Code: [email or web address] URL: [email or web address] Date: 06/14/2022 Prepared by: Bradly Chris  Exercises - Sit to Stand with Counter Support  - 1 x daily - 3 x weekly - 3 sets - 5 reps - Seated Sciatic Tensioner  - 1 x daily - 7 x weekly - 1 sets - 10 reps - Prone Quadriceps Stretch with Strap  - 1 x daily - 7 x weekly - 1 sets - 3 reps - 30 hold - Ab Prep  - 1 x daily - 3 x weekly - 3 sets - 10 reps - Standing Heel Raise with Support  - 1 x daily - 3 x weekly - 3 sets - 10 reps - Forward Bending Hip Flexion with Flat Lumbar Spine  - 1 x daily - 3 x weekly - 3 sets - 10 reps - Prone Press Up On Elbows  - 1 x daily - 7 x weekly  - 1 sets - 3 reps - 30 hold - Supine Dynamic Modified Keval Quad and Hip Flexor Dynamic Stretch  - 1 x daily - 7 x weekly - 1 sets - 3 reps - 30 hold   ASSESSMENT:   CLINICAL IMPRESSION:  Pt continues to show decreased dynamic balance as evidenced by unchanged FGA score. Given little to no improvement in dynamic balance, future focus of sessions will be on strength and static balance. Pt educated on energy conservation techniques with use of chair while performing grooming activities to avoid increase low back.   He will continue to benefit from skilled PT to improve static and dynamic balance and LE strength to decrease his risk for falling.   OBJECTIVE IMPAIRMENTS Abnormal gait, decreased balance, difficulty walking, decreased ROM, decreased strength, impaired flexibility, impaired sensation, and pain.    ACTIVITY LIMITATIONS shopping and community activity.    PERSONAL FACTORS Fitness, Past/current experiences, Time since onset of injury/illness/exacerbation, and 3+ comorbidities: h/o stroke, psoriatic arthritis, degenerative disc disease  are also affecting patient's functional outcome.      REHAB POTENTIAL: Fair chronicity of right hemiparesis and reduced familial and social support    CLINICAL DECISION MAKING: Stable/uncomplicated   EVALUATION COMPLEXITY: Low     GOALS: Goals reviewed with patient? No   SHORT TERM GOALS: Target date: 08/17/2022     Pt will be independent with HEP in order to improve strength and balance in order to decrease fall risk and improve function at home and work. Baseline: n/A 06/16/22: Performing exercises independently  Goal status: ONGoing   2.  Patient will perform sit to stand without use of UE support to demonstrate improve LE strength.  Baseline: Unable to perform without use of hands 06/16/22: Able to perform without use of UE Goal status: ACHIEVED        LONG TERM GOALS: Target date: 08/18/2022     Patient will have improved function  and activity level as evidenced by  an increase in FOTO score to predicted score. Baseline: 47/51 Goal status: ONGOING    2.  Patient will improve 5 x STS to <11.4 sec to demonstrate LE strength that places him at a decreased risk for falls. Baseline: Unable to perform because needs to use UE support 06/16/22: Only able to perform 2  Goal status: Partially met    3. Patient will hold tandem stance for >=10 sec to exhibit improved static balance to decrease his risk of falling.   Baseline: Tandem stance R/L 3 sec/0 sec  06/16/22: Tandem R/L 10 sec /8 sec; unable to perform full tandem with RLE as back foot  Goal status: Partially met    4.  Patient will improve his FGA score my 5 points to demonstrate a minimal significant improvement in his dynamic balance thus decreasing his risk for falling.  Baseline: 13/30 06/21/22: 16/30 Goal status: Not Met        PLAN: PT FREQUENCY: 1-2x/week   PT DURATION: 8 weeks   PLANNED INTERVENTIONS: Therapeutic exercises, Therapeutic activity, Neuromuscular re-education, Balance training, Gait training, Patient/Family education, Joint manipulation, Joint mobilization, Stair training, Orthotic/Fit training, DME instructions, Aquatic Therapy, Dry Needling, Cryotherapy, Moist heat, Manual therapy, and Re-evaluation   PLAN FOR NEXT SESSION:  Dynamic balance and walking sessions; backward walking and obstacles. Progress LE strength and static balance exercises.    Bradly Chris PT, DPT 06/25/2022, 10:23 AM

## 2022-06-23 ENCOUNTER — Ambulatory Visit: Payer: Medicare Other | Admitting: Physical Therapy

## 2022-06-23 ENCOUNTER — Encounter: Payer: Self-pay | Admitting: Physical Therapy

## 2022-06-23 DIAGNOSIS — R262 Difficulty in walking, not elsewhere classified: Secondary | ICD-10-CM | POA: Diagnosis not present

## 2022-06-23 DIAGNOSIS — I6930 Unspecified sequelae of cerebral infarction: Secondary | ICD-10-CM

## 2022-06-23 NOTE — Therapy (Addendum)
OUTPATIENT PHYSICAL THERAPY TREATMENT  NOTE  Patient Name: Anthony Evans MRN: [id number] DOB:Mar 01, 1951, 71 y.o., male Today's Date: 06/23/2022  PCP: Dr. Tracie Harrier  REFERRING PROVIDER: Dr. Allene Dillon   END OF SESSION:    PT End of Session - 06/23/22 1633     Visit Number 12    Number of Visits 16    Date for PT Re-Evaluation 08/18/22    Authorization Type [employer] Medicare    PT Start Time 1630    PT Stop Time 1715    PT Time Calculation (min) 45 min    Equipment Utilized During Treatment Gait belt    Activity Tolerance Patient tolerated treatment well    Behavior During Therapy WFL for tasks assessed/performed               Past Medical History:  Diagnosis Date   Allergy    Arthritis    psoriatic   Back pain    Cataract    Degenerative disc disease    Diverticulitis    GERD (gastroesophageal reflux disease)    Hypertension    Hypothyroidism    Psoriasis    Reflux    Seizures ([place])    last seizure about 10 years ago   Stroke [facility])    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   Thalassemia    Thyroid disease    Past Surgical History:  Procedure Laterality Date   BACK SURGERY     screws and plate in c4 and c5   CATARACT EXTRACTION W/PHACO Left 05/15/2015   Procedure: CATARACT EXTRACTION PHACO AND INTRAOCULAR LENS PLACEMENT ([place]);  Surgeon: Lyla Glassing, MD;  Location: [facility] ORS;  Service: Ophthalmology;  Laterality: Left;  [place]: 2:40.9 AP: 17.3 CDE: 27.88   CERVICAL FUSION     COLONOSCOPY WITH PROPOFOL N/A 06/14/2016   Procedure: COLONOSCOPY WITH PROPOFOL;  Surgeon: Manya Silvas, MD;  Location: [facility] ENDOSCOPY;  Service: Endoscopy;  Laterality: N/A;   COLONOSCOPY WITH PROPOFOL N/A 03/15/2022   Procedure: COLONOSCOPY WITH PROPOFOL;  Surgeon: [name], DO;  Location: [facility] ENDOSCOPY;  Service: Gastroenterology;  Laterality: N/A;   CORRECTION HAMMER TOE     ESOPHAGOGASTRODUODENOSCOPY N/A 06/11/2022   Procedure: ESOPHAGOGASTRODUODENOSCOPY (EGD);  Surgeon:  [name], DO;  Location: [facility] ENDOSCOPY;  Service: Gastroenterology;  Laterality: N/A;   ESOPHAGOGASTRODUODENOSCOPY (EGD) WITH PROPOFOL N/A 03/15/2022   Procedure: ESOPHAGOGASTRODUODENOSCOPY (EGD) WITH PROPOFOL;  Surgeon: [name], DO;  Location: [place];  Service: Gastroenterology;  Laterality: N/A;   EYE SURGERY Right    cataract   FRACTURE SURGERY     HIP FRACTURE SURGERY Right    pins in hip   JOINT REPLACEMENT     KNEE ARTHROPLASTY Left    total knee replacement   Patient Active Problem List   Diagnosis Date Noted   Syncope 11/05/2019   C2 cervical fracture ([place]) 09/14/2018   Hypertension 03/08/2018   GERD (gastroesophageal reflux disease) 03/08/2018   Thyroid disease 03/08/2018   Degenerative disc disease 03/08/2018   Psoriatic arthritis ([place]) 03/08/2018   Back pain 03/08/2018   Thalassemia minor 03/08/2018   Seizures ([place]) 03/08/2018   H/O cataract 03/08/2018   Seasonal allergies 03/08/2018    REFERRING DIAG: Imbalance   THERAPY DIAG:  Difficulty in walking, not elsewhere classified  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 Ladona Ridgel    HPI The patient is a 71 year old gentleman followed by Dr. Joya Salm at  [place] and presents today for follow-up of low back pain with radiation into the left anterior thigh consistent with an L3 radiculitis. His symptoms began around the fall 2016 without preceding injury or trauma. His symptoms are worsened with heavy lifting, bending, twisting, and occasionally with standing. He has some relief with sitting and rest. Chiropractic manipulation with Dr. Laveda Abbe often provides moderate to good relief. Medications have included Mobic 7.5 mg twice daily as needed, Flexeril 5 mg as needed, and an occasional hydrocodone. He prefers to avoid narcotics if possible.   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 He is now on permanent disability.    He was last evaluated on 06/27/2020 at which time he was experiencing left low back pain rating to the left anterior thigh consistent with an L3 radiculitis for which he was scheduled for another epidural steroid injection. He was to continue with meloxicam and referred for balance therapy at [facility].       At today's visit ports that he has experienced a flare of pain radiating from the left low back into the left anterior thigh stopping at the knee over the last several months and is hopeful to schedule for another epidural steroid injection. He continues meloxicam. He also continues to experience some imbalance and states that he never heard from [facility] for balance therapy I will refer him today. On exam today he is without any noted weakness in the left lower extremity.   PRECAUTIONS: FALLS  SUBJECTIVE: Pt improvements in low back pain since last session.    PAIN:  Are you having pain? No pain   OBJECTIVE:   VITALS: BP 134/88 SpO2 97 HR 106      DIAGNOSTIC FINDINGS: CLINICAL DATA:  71 year old male status post fall backwards with subsequent weakness.   EXAM: MRI LUMBAR SPINE WITHOUT CONTRAST   TECHNIQUE: Multiplanar, multisequence MR imaging of the lumbar spine was performed. No intravenous contrast was administered.   COMPARISON:  Lumbar MRI 05/17/2019. Lumbar spine CT 10/10/2017.   FINDINGS: Segmentation: Transitional anatomy. For the purposes of this report hypoplastic or absent ribs are designated at T12 (full size ribs at T11) resulting in the lowest open disc space at L5-S1, and this is the same numbering system used on the June MRI. Correlation with radiographs is recommended prior to any operative intervention.   Alignment: Stable since June. Moderate chronic levoconvex lumbar scoliosis with superimposed mild grade 1 anterolisthesis of L4 on L5.   Vertebrae: Chronic T12 compression fracture is  stable with mild-to-moderate loss of vertebral height. Superimposed mild left L1 superior endplate compression or large Schmorl's node. Additional chronic endplate changes in the lower lumbar spine. No marrow edema or evidence of acute osseous abnormality. Normal background bone marrow signal. Stable and benign appearing chronic cystic appearing bone lesion in the medial right iliac on series 14, image 36. Intact visible sacrum and SI joints.   Conus medullaris and cauda equina: Conus extends to the T12-L1 level. No lower spinal cord or conus signal abnormality.   Paraspinal and other soft tissues: Stable since June.   Disc levels:   L1-L2 with no stable visible lower thoracic spine through spinal stenosis. There is multilevel chronic moderate and severe lower thoracic and upper lumbar neural foraminal stenosis greater on the right.   L2-L3: Moderate multifactorial spinal stenosis with severe right lateral recess stenosis is stable related to rightward disc osteophyte complex and posterior element hypertrophy. Mild left and severe right L2 foraminal  stenosis is stable.   L3-L4: Moderate to severe multifactorial spinal and right lateral recess stenosis is stable related to circumferential disc osteophyte complex and moderate to severe posterior element hypertrophy. Moderate to severe left and moderate right L3 foraminal stenosis is stable.   L4-L5: Moderate to severe chronic spinal and left greater than right lateral recess stenosis appears stable in the setting of grade 1 anterolisthesis. A small subligamentous synovial cyst has developed on the left (series 11, image 27), although does not exacerbate the stenosis at this time. Moderate to severe left and mild right L4 foraminal stenosis is stable.   L5-S1: Left side assimilation joint with left far lateral disc osteophyte complex and moderate posterior element hypertrophy again noted. No spinal or lateral recess stenosis.  Moderate to severe left and mild right L5 foraminal stenosis is stable.   IMPRESSION: 1. Stable MRI appearance of the lumbar spine since June. 2. Transitional anatomy. Correlation with radiographs is recommended prior to any operative intervention. 3. Chronic levoconvex lumbar scoliosis and grade 1 anterolisthesis at L4-L5 with widespread advanced disc, endplate, and posterior element degeneration. 4. Moderate to severe multifactorial spinal and lateral recess stenosis is stable from L2-L3 through L4-L5. 5. Widespread lower thoracic and lumbar moderate to severe neural foraminal stenosis is stable.     Electronically Signed   By: Genevie Ann M.D.   On: 11/05/2019 02:28   PATIENT SURVEYS:  FOTO 47/51   COGNITION:           Overall cognitive status: Within functional limits for tasks assessed                          SENSATION: Light touch: Impaired on RLE and RUE    MUSCLE LENGTH: Deferred to next visit  Hamstrings: Right NT deg; Left NT deg Marcello Moores test: Right NT deg; Left NT deg   POSTURE:  Left sided trunk  lean    PALPATION: N/a   LE ROM:     Active  Right 04/23/2022 Left 04/23/2022  Hip flexion 120 120  Hip extension 30 30  Hip abduction 45 45  Hip adduction 30 30  Hip internal rotation      Hip external rotation      Knee flexion 135 135  Knee extension 0 0  Ankle dorsiflexion 5 20  Ankle plantarflexion 50 50  Ankle inversion 35 35  Ankle eversion 15 15   (Blank rows = not tested)      LE MMT:   MMT Right 04/23/2022 Left 04/23/2022  Hip flexion 5 5  Hip extension      Hip abduction      Hip adduction      Hip internal rotation      Hip external rotation      Knee flexion 5 5  Knee extension 5 5  Ankle dorsiflexion 2+ 5  Ankle plantarflexion 5 5  Ankle inversion      Ankle eversion       (Blank rows = not tested)      LOWER EXTREMITY SPECIAL TESTS: NT     FUNCTIONAL TESTS:    5 Times Sit to Stand: 0 reps  (Individuals with times that  exceed the listed time have worse than average performance - predictive of recurrent falls in healthy community-living subjects)         - 60-69 y.o. 11.4 sec         - 70-79 y.o. 12.6 sec         -  65-89 y.o. 14.8 sec                 30 sec chair stands: 0 reps   <12 reps for Males 70-74   *A below average score indicates a risk for falls   STEADI 4-Stage Balance Test: (inability to maintain tandem stance for 10 sec = increased risk for falls) - feet together, eyes open, noncompliant surface: 10/10 sec - semi-tandem stance, eyes open, noncompliant surface: 10/10 sec - tandem stances, eyes open, noncompliant surface: 3/ 0 sec - single leg stance: NT        GAIT: Distance walked: Right sided hemiplegia, decreased stance time on RLE and decreased step length on LLE  Assistive device utilized: None Level of assistance: Complete [place] Comments: H/o of left sided CVA as a child        TODAY'S TREATMENT:  06/23/22: Nu-Step Level 10 for seat and arms 5 min  Sit to Stand 1 x 10 without use of UE support  Sit to Stand 1 x 6 with intermittent use of 1 UE  Sit to Stand 1 x 4 with intermittent use of 1 UE  Supine Double Bent Knee Leg Lift 3 x 10  -Pt instructed to attempt pursed lipped breathing to maintain breath  Quadruped Hip Extension 1 x 10 -Pt struggle to complete exercises  Quadruped Shoulder Flexion 2 x 10    06/21/22: Nu-Step Level 11 for seat and arms 5 min  Modified Ezekial Stretch with #5 AW and overpressure 3 x 60 sec  Sit to Stand with 1 UE support  3 x 10  -min to mod VC to increase depth of sit to make   FGA: 13 -Mild Impairments: Gait with eyes closed, vertical and horizontal head turns, step over obstacle, change in gait speed, gait level surface, gait with pivot turns  -Moderate Impairments: Ambulating Backwards + Stairs    06/16/22            Nu-Step 6 min with seat and arms at 11 and resistance at 2             Prone Prop Up on Elbows 3 x 30 sec              Modified Koehn Stretch with #5 AW to increase stretch 3 x 30 sec             5 STS: 2 reps            Tandem Stance R/L 10 sec/ 8 sec (not able to achieve full tandem)    06/14/22   Nu-Step 5 min with seat and arms at 10 and resistance at 2 - 5 min  Sit to Stand from 18 inch mat height 3 x 10 with use of 1 UE  Prone Prop Ups on Elbows 3 x 30 secs Modified Jaxen Stretch 3 x 30 sec   NMR   Romberg Forwad and Backward Leans 3 x 10  -min VC to increase lumbar extension                        06/02/22  THEREX   Nu-Step Seat level and arm length 11 for 5 min Standing Heel Raises with BUE support  3 x 10    NMR   Step on and off 4 inch platform x 2 for 27 ft x 6 with SPC   Step over obstacles 1 ft x 5 for 27 ft x 6 with SPC  Figure 8s x 3 cones for 27 ft x 10  Figure 8s x 3 cones for 27 ft x 6  -min VC to recall numbers on PT's hand to prompt patient to look up while walking    05/31/22   THEREX:  Nu-Step Seat level and arm length 11 for 5 min   Supine Sit and Reaches/Abdominal Crunches 3 x 10 -min VC to inhale as he reaches up and to exhale as he goes down   Sit to Stand 3 x 10  -min VC to increase stand up speed and decrease eccentric phase  -min VC to place nose over toes   NMR:  10 m x 4 horz head turns with SPC  10 m x 4 vert head turns with SPC  10 m x 6 vert + horz head turns with [facility]    PATIENT EDUCATION:  Education details: Form and technique for appropriate exercise  Person educated: Patient Education method: Explanation Education comprehension: verbalized understanding, returned demonstration, and verbal cues required     HOME EXERCISE PROGRAM: Access Code: [email or web address] URL: [email or web address] Date: 06/23/2022 Prepared by: Bradly Chris  Exercises - Sit to Stand with Counter Support  - 1 x daily - 3 x weekly - 3 sets - 5 reps - Seated Sciatic Tensioner  - 1 x daily - 7 x weekly - 1 sets - 10 reps - Prone Quadriceps  Stretch with Strap  - 1 x daily - 7 x weekly - 1 sets - 3 reps - 30 hold - Standing Heel Raise with Support  - 1 x daily - 3 x weekly - 3 sets - 10 reps - Forward Bending Hip Flexion with Flat Lumbar Spine  - 1 x daily - 3 x weekly - 3 sets - 10 reps - Prone Press Up On Elbows  - 1 x daily - 7 x weekly - 1 sets - 3 reps - 30 hold - Supine Dynamic Modified Karl Pock and Hip Flexor Dynamic Stretch  - 1 x daily - 7 x weekly - 1 sets - 3 reps - 30 hold - Supine Double Bent Leg Lift  - 1 x daily - 3 x weekly - 3 sets - 10 reps - Quadruped Alternating Arm Lift  - 1 x daily - 3 x weekly - 3 sets - 10 reps   ASSESSMENT:   CLINICAL IMPRESSION:  Pt shows improved LE strength with ability to perform sit to stand with decreased UE support and improved abdominal strength with ability to perform double bent knee leg lift. He continues to show hip extension weakness with difficulty performing quadruped hip extension with exercise needing modification to quadruped shoulder flexion. He was able to tolerate all exercises without an increase in his pain.  He will continue to benefit from skilled PT to improve static and dynamic balance and LE strength to decrease his risk for falling.   OBJECTIVE IMPAIRMENTS Abnormal gait, decreased balance, difficulty walking, decreased ROM, decreased strength, impaired flexibility, impaired sensation, and pain.    ACTIVITY LIMITATIONS shopping and community activity.    PERSONAL FACTORS Fitness, Past/current experiences, Time since onset of injury/illness/exacerbation, and 3+ comorbidities: h/o stroke, psoriatic arthritis, degenerative disc disease  are also affecting patient's functional outcome.      REHAB POTENTIAL: Fair chronicity of right hemiparesis and reduced familial and social support    CLINICAL DECISION MAKING: Stable/uncomplicated   EVALUATION COMPLEXITY: Low     GOALS: Goals reviewed with patient? No   SHORT TERM GOALS: Target  date: 08/17/2022     Pt  will be independent with HEP in order to improve strength and balance in order to decrease fall risk and improve function at home and work. Baseline: n/A 06/16/22: Performing exercises independently  Goal status: ONGoing   2.  Patient will perform sit to stand without use of UE support to demonstrate improve LE strength.  Baseline: Unable to perform without use of hands 06/16/22: Able to perform without use of UE Goal status: ACHIEVED        LONG TERM GOALS: Target date: 08/18/2022     Patient will have improved function and activity level as evidenced by an increase in FOTO score to predicted score. Baseline: 47/51 Goal status: ONGOING    2.  Patient will improve 5 x STS to <11.4 sec to demonstrate LE strength that places him at a decreased risk for falls. Baseline: Unable to perform because needs to use UE support 06/16/22: Only able to perform 2 reps Goal status: Partially met    3. Patient will hold tandem stance for >=10 sec to exhibit improved static balance to decrease his risk of falling.   Baseline: Tandem stance R/L 3 sec/0 sec  06/16/22: Tandem R/L 10 sec /8 sec; unable to perform full tandem with RLE as back foot  Goal status: Partially met    4.  Patient will improve his FGA score my 5 points to demonstrate a minimal significant improvement in his dynamic balance thus decreasing his risk for falling.  Baseline: 13/30 06/21/22: 16/30 Goal status: Not Met        PLAN: PT FREQUENCY: 1-2x/week   PT DURATION: 8 weeks   PLANNED INTERVENTIONS: Therapeutic exercises, Therapeutic activity, Neuromuscular re-education, Balance training, Gait training, Patient/Family education, Joint manipulation, Joint mobilization, Stair training, Orthotic/Fit training, DME instructions, Aquatic Therapy, Dry Needling, Cryotherapy, Moist heat, Manual therapy, and Re-evaluation   PLAN FOR NEXT SESSION: Progress LE strength and prioritize static balance exercises.    Bradly Chris PT,  DPT 06/24/2022, 5:24 PM

## 2022-06-28 ENCOUNTER — Ambulatory Visit: Payer: Medicare Other | Admitting: Physical Therapy

## 2022-06-28 ENCOUNTER — Encounter: Payer: Self-pay | Admitting: Physical Therapy

## 2022-06-28 DIAGNOSIS — R262 Difficulty in walking, not elsewhere classified: Secondary | ICD-10-CM | POA: Diagnosis not present

## 2022-06-28 DIAGNOSIS — I6930 Unspecified sequelae of cerebral infarction: Secondary | ICD-10-CM

## 2022-06-28 DIAGNOSIS — Z8673 Personal history of transient ischemic attack (TIA), and cerebral infarction without residual deficits: Secondary | ICD-10-CM

## 2022-06-28 NOTE — Therapy (Signed)
OUTPATIENT PHYSICAL THERAPY TREATMENT  NOTE  Patient Name: Anthony Evans MRN: [id number] DOB:[birth year], 71 y.o., male Today's Date: 06/23/2022  PCP: Dr. Tracie Harrier  REFERRING PROVIDER: Dr. Allene Dillon   END OF SESSION:    PT End of Session - 06/23/22 1633     Visit Number 12    Number of Visits 16    Date for PT Re-Evaluation 08/18/22    Authorization Type [employer] Medicare    PT Start Time 1630    PT Stop Time 1715    PT Time Calculation (min) 45 min    Equipment Utilized During Treatment Gait belt    Activity Tolerance Patient tolerated treatment well    Behavior During Therapy WFL for tasks assessed/performed               Past Medical History:  Diagnosis Date   Allergy    Arthritis    psoriatic   Back pain    Cataract    Degenerative disc disease    Diverticulitis    GERD (gastroesophageal reflux disease)    Hypertension    Hypothyroidism    Psoriasis    Reflux    Seizures ([place])    last seizure about 10 years ago   Stroke [facility])    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   Thalassemia    Thyroid disease    Past Surgical History:  Procedure Laterality Date   BACK SURGERY     screws and plate in c4 and c5   CATARACT EXTRACTION W/PHACO Left 05/15/2015   Procedure: CATARACT EXTRACTION PHACO AND INTRAOCULAR LENS PLACEMENT ([place]);  Surgeon: Lyla Glassing, MD;  Location: [facility] ORS;  Service: Ophthalmology;  Laterality: Left;  [place]: 2:40.9 AP: 17.3 CDE: 27.88   CERVICAL FUSION     COLONOSCOPY WITH PROPOFOL N/A 06/14/2016   Procedure: COLONOSCOPY WITH PROPOFOL;  Surgeon: Manya Silvas, MD;  Location: [facility] ENDOSCOPY;  Service: Endoscopy;  Laterality: N/A;   COLONOSCOPY WITH PROPOFOL N/A 03/15/2022   Procedure: COLONOSCOPY WITH PROPOFOL;  Surgeon: [name], DO;  Location: [facility] ENDOSCOPY;  Service: Gastroenterology;  Laterality: N/A;   CORRECTION HAMMER TOE     ESOPHAGOGASTRODUODENOSCOPY N/A 06/11/2022   Procedure: ESOPHAGOGASTRODUODENOSCOPY (EGD);  Surgeon:  [name], DO;  Location: [facility] ENDOSCOPY;  Service: Gastroenterology;  Laterality: N/A;   ESOPHAGOGASTRODUODENOSCOPY (EGD) WITH PROPOFOL N/A 03/15/2022   Procedure: ESOPHAGOGASTRODUODENOSCOPY (EGD) WITH PROPOFOL;  Surgeon: [name], DO;  Location: [place];  Service: Gastroenterology;  Laterality: N/A;   EYE SURGERY Right    cataract   FRACTURE SURGERY     HIP FRACTURE SURGERY Right    pins in hip   JOINT REPLACEMENT     KNEE ARTHROPLASTY Left    total knee replacement   Patient Active Problem List   Diagnosis Date Noted   Syncope 11/05/2019   C2 cervical fracture ([place]) 09/14/2018   Hypertension 03/08/2018   GERD (gastroesophageal reflux disease) 03/08/2018   Thyroid disease 03/08/2018   Degenerative disc disease 03/08/2018   Psoriatic arthritis ([place]) 03/08/2018   Back pain 03/08/2018   Thalassemia minor 03/08/2018   Seizures ([place]) 03/08/2018   H/O cataract 03/08/2018   Seasonal allergies 03/08/2018    REFERRING DIAG: Imbalance   THERAPY DIAG:  Difficulty in walking, not elsewhere classified  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 Ladona Ridgel    HPI The patient is a 71 year old gentleman followed by Dr. Joya Salm at  [facility] and presents today for follow-up of low back pain with radiation into the left anterior thigh consistent with an L3 radiculitis. His symptoms began around the fall 2016 without preceding injury or trauma. His symptoms are worsened with heavy lifting, bending, twisting, and occasionally with standing. He has some relief with sitting and rest. Chiropractic manipulation with Dr. Laveda Abbe often provides moderate to good relief. Medications have included Mobic 7.5 mg twice daily as needed, Flexeril 5 mg as needed, and an occasional hydrocodone. He prefers to avoid narcotics if possible.   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 He is now on permanent disability.    He was last evaluated on 06/27/2020 at which time he was experiencing left low back pain rating to the left anterior thigh consistent with an L3 radiculitis for which he was scheduled for another epidural steroid injection. He was to continue with meloxicam and referred for balance therapy at [facility].       At today's visit ports that he has experienced a flare of pain radiating from the left low back into the left anterior thigh stopping at the knee over the last several months and is hopeful to schedule for another epidural steroid injection. He continues meloxicam. He also continues to experience some imbalance and states that he never heard from [facility] for balance therapy I will refer him today. On exam today he is without any noted weakness in the left lower extremity.   PRECAUTIONS: FALLS  SUBJECTIVE: Pt reports ongoing low back pain especially when it is about rain. He states that when he feels back pain he limits the amount of exercises he does to avoid increasing back pain.   PAIN:  Are you having pain? 5/10    OBJECTIVE:   VITALS: BP 134/88 SpO2 97 HR 106      DIAGNOSTIC FINDINGS: CLINICAL DATA:  71 year old male status post fall backwards with subsequent weakness.   EXAM: MRI LUMBAR SPINE WITHOUT CONTRAST   TECHNIQUE: Multiplanar, multisequence MR imaging of the lumbar spine was performed. No intravenous contrast was administered.   COMPARISON:  Lumbar MRI 05/17/2019. Lumbar spine CT 10/10/2017.   FINDINGS: Segmentation: Transitional anatomy. For the purposes of this report hypoplastic or absent ribs are designated at T12 (full size ribs at T11) resulting in the lowest open disc space at L5-S1, and this is the same numbering system used on the June MRI. Correlation with radiographs is recommended prior to any operative intervention.   Alignment: Stable since June. Moderate chronic levoconvex  lumbar scoliosis with superimposed mild grade 1 anterolisthesis of L4 on L5.   Vertebrae: Chronic T12 compression fracture is stable with mild-to-moderate loss of vertebral height. Superimposed mild left L1 superior endplate compression or large Schmorl's node. Additional chronic endplate changes in the lower lumbar spine. No marrow edema or evidence of acute osseous abnormality. Normal background bone marrow signal. Stable and benign appearing chronic cystic appearing bone lesion in the medial right iliac on series 14, image 36. Intact visible sacrum and SI joints.   Conus medullaris and cauda equina: Conus extends to the T12-L1 level. No lower spinal cord or conus signal abnormality.   Paraspinal and other soft tissues: Stable since June.   Disc levels:   L1-L2 with no stable visible lower thoracic spine through spinal stenosis. There is multilevel chronic moderate and severe lower thoracic and upper lumbar neural foraminal stenosis greater on the right.   L2-L3: Moderate multifactorial spinal stenosis with severe  right lateral recess stenosis is stable related to rightward disc osteophyte complex and posterior element hypertrophy. Mild left and severe right L2 foraminal stenosis is stable.   L3-L4: Moderate to severe multifactorial spinal and right lateral recess stenosis is stable related to circumferential disc osteophyte complex and moderate to severe posterior element hypertrophy. Moderate to severe left and moderate right L3 foraminal stenosis is stable.   L4-L5: Moderate to severe chronic spinal and left greater than right lateral recess stenosis appears stable in the setting of grade 1 anterolisthesis. A small subligamentous synovial cyst has developed on the left (series 11, image 27), although does not exacerbate the stenosis at this time. Moderate to severe left and mild right L4 foraminal stenosis is stable.   L5-S1: Left side assimilation joint with left far  lateral disc osteophyte complex and moderate posterior element hypertrophy again noted. No spinal or lateral recess stenosis. Moderate to severe left and mild right L5 foraminal stenosis is stable.   IMPRESSION: 1. Stable MRI appearance of the lumbar spine since June. 2. Transitional anatomy. Correlation with radiographs is recommended prior to any operative intervention. 3. Chronic levoconvex lumbar scoliosis and grade 1 anterolisthesis at L4-L5 with widespread advanced disc, endplate, and posterior element degeneration. 4. Moderate to severe multifactorial spinal and lateral recess stenosis is stable from L2-L3 through L4-L5. 5. Widespread lower thoracic and lumbar moderate to severe neural foraminal stenosis is stable.     Electronically Signed   By: Genevie Ann M.D.   On: 11/05/2019 02:28   PATIENT SURVEYS:  FOTO 47/51   COGNITION:           Overall cognitive status: Within functional limits for tasks assessed                          SENSATION: Light touch: Impaired on RLE and RUE    MUSCLE LENGTH: Deferred to next visit  Hamstrings: Right NT deg; Left NT deg Marcello Moores test: Right NT deg; Left NT deg   POSTURE:  Left sided trunk  lean    PALPATION: N/a   LE ROM:     Active  Right 04/23/2022 Left 04/23/2022  Hip flexion 120 120  Hip extension 30 30  Hip abduction 45 45  Hip adduction 30 30  Hip internal rotation      Hip external rotation      Knee flexion 135 135  Knee extension 0 0  Ankle dorsiflexion 5 20  Ankle plantarflexion 50 50  Ankle inversion 35 35  Ankle eversion 15 15   (Blank rows = not tested)      LE MMT:   MMT Right 04/23/2022 Left 04/23/2022  Hip flexion 5 5  Hip extension      Hip abduction      Hip adduction      Hip internal rotation      Hip external rotation      Knee flexion 5 5  Knee extension 5 5  Ankle dorsiflexion 2+ 5  Ankle plantarflexion 5 5  Ankle inversion      Ankle eversion       (Blank rows = not tested)       LOWER EXTREMITY SPECIAL TESTS: NT     FUNCTIONAL TESTS:    5 Times Sit to Stand: 0 reps  (Individuals with times that exceed the listed time have worse than average performance - predictive of recurrent falls in healthy community-living subjects)         -  26-69 y.o. 11.4 sec         - 70-79 y.o. 12.6 sec         - 80-89 y.o. 14.8 sec                 30 sec chair stands: 0 reps   <12 reps for Males 70-74   *A below average score indicates a risk for falls   STEADI 4-Stage Balance Test: (inability to maintain tandem stance for 10 sec = increased risk for falls) - feet together, eyes open, noncompliant surface: 10/10 sec - semi-tandem stance, eyes open, noncompliant surface: 10/10 sec - tandem stances, eyes open, noncompliant surface: 3/ 0 sec - single leg stance: NT        GAIT: Distance walked: Right sided hemiplegia, decreased stance time on RLE and decreased step length on LLE  Assistive device utilized: None Level of assistance: Complete Independence Comments: H/o of left sided CVA as a child        TODAY'S TREATMENT:  06/28/22: Nu-Step Level 10 for seat and arms and level 4 resistance - 5 min  Lower Trunk Rotations 3 x 10  Double Bent Knee Lifts 3 x 10  -min VC to decrease eccentric portion of exercise  Supine Bridges 3 x 10  -min VC to decrease eccentric portion of exercise  Sit to Stands from 18" chair height with 1 UE support 3 x 10 Standing Hip Abduction with 1 UE support 2 x 10                 06/23/22: Nu-Step Level 10 for seat and arms 5 min  Sit to Stand 1 x 10 without use of UE support  Sit to Stand 1 x 6 with intermittent use of 1 UE  Sit to Stand 1 x 4 with intermittent use of 1 UE  Supine Double Bent Knee Leg Lift 3 x 10  -Pt instructed to attempt pursed lipped breathing to maintain breath  Quadruped Hip Extension 1 x 10 -Pt struggle to complete exercises  Quadruped Shoulder Flexion 2 x 10    06/21/22: Nu-Step Level 11 for seat and  arms 5 min  Modified Less Stretch with #5 AW and overpressure 3 x 60 sec  Sit to Stand with 1 UE support  3 x 10  -min to mod VC to increase depth of sit to make   FGA: 13 -Mild Impairments: Gait with eyes closed, vertical and horizontal head turns, step over obstacle, change in gait speed, gait level surface, gait with pivot turns  -Moderate Impairments: Ambulating Backwards + Stairs    06/16/22            Nu-Step 6 min with seat and arms at 11 and resistance at 2             Prone Prop Up on Elbows 3 x 30 sec             Modified [place] Stretch with #5 AW to increase stretch 3 x 30 sec             5 STS: 2 reps            Tandem Stance R/L 10 sec/ 8 sec (not able to achieve full tandem)       PATIENT EDUCATION:  Education details: Form and technique for appropriate exercise  Person educated: Patient Education method: Explanation Education comprehension: verbalized understanding, returned demonstration, and verbal cues required     HOME EXERCISE PROGRAM:  Access Code: [email or web address] URL: [email or web address] Date: 06/28/2022 Prepared by: Ellin Goodie  Exercises - Sit to Stand with Counter Support  - 1 x daily - 3 x weekly - 3 sets - 5 reps - Seated Sciatic Tensioner  - 1 x daily - 7 x weekly - 1 sets - 10 reps - Prone Quadriceps Stretch with Strap  - 1 x daily - 7 x weekly - 1 sets - 3 reps - 30 hold - Standing Heel Raise with Support  - 1 x daily - 3 x weekly - 3 sets - 10 reps - Forward Bending Hip Flexion with Flat Lumbar Spine  - 1 x daily - 3 x weekly - 3 sets - 10 reps - Prone Press Up On Elbows  - 1 x daily - 7 x weekly - 1 sets - 3 reps - 30 hold - Supine Dynamic Modified Lenis Noon and Hip Flexor Dynamic Stretch  - 1 x daily - 7 x weekly - 1 sets - 3 reps - 30 hold - Supine Double Bent Leg Lift  - 1 x daily - 3 x weekly - 3 sets - 10 reps - Standing Hip Abduction with Counter Support  - 1 x daily - 3 x weekly - 2 sets - 10 reps   ASSESSMENT:    CLINICAL IMPRESSION: Pt continues to exhibit similar LE strength with need for UE support for sit to stand. He does show improved hip abduction with ability to perform standing hip abduction albeit with increased compensation with lateral lean. Pt does show limited participation in HEP often reporting that he only does certain exercises and benefits from doing exercises with PT for increased accountability and motivation. He is nearing end of POC and he will continue to benefit from skilled PT to improve static and dynamic balance and LE strength to decrease his risk for falling.    OBJECTIVE IMPAIRMENTS Abnormal gait, decreased balance, difficulty walking, decreased ROM, decreased strength, impaired flexibility, impaired sensation, and pain.    ACTIVITY LIMITATIONS shopping and community activity.    PERSONAL FACTORS Fitness, Past/current experiences, Time since onset of injury/illness/exacerbation, and 3+ comorbidities: h/o stroke, psoriatic arthritis, degenerative disc disease  are also affecting patient's functional outcome.      REHAB POTENTIAL: Fair chronicity of right hemiparesis and reduced familial and social support    CLINICAL DECISION MAKING: Stable/uncomplicated   EVALUATION COMPLEXITY: Low     GOALS: Goals reviewed with patient? No   SHORT TERM GOALS: Target date: 08/17/2022     Pt will be independent with HEP in order to improve strength and balance in order to decrease fall risk and improve function at home and work. Baseline: n/A 06/16/22: Performing exercises independently  Goal status: ONGoing   2.  Patient will perform sit to stand without use of UE support to demonstrate improve LE strength.  Baseline: Unable to perform without use of hands 06/16/22: Able to perform without use of UE Goal status: ACHIEVED        LONG TERM GOALS: Target date: 08/18/2022     Patient will have improved function and activity level as evidenced by an increase in FOTO score to  predicted score. Baseline: 47/51 Goal status: ONGOING    2.  Patient will improve 5 x STS to <11.4 sec to demonstrate LE strength that places him at a decreased risk for falls. Baseline: Unable to perform because needs to use UE support 06/16/22: Only able to perform 2 reps Goal status: Partially met  3. Patient will hold tandem stance for >=10 sec to exhibit improved static balance to decrease his risk of falling.   Baseline: Tandem stance R/L 3 sec/0 sec  06/16/22: Tandem R/L 10 sec /8 sec; unable to perform full tandem with RLE as back foot  Goal status: Partially met    4.  Patient will improve his FGA score my 5 points to demonstrate a minimal significant improvement in his dynamic balance thus decreasing his risk for falling.  Baseline: 13/30 06/21/22: 16/30 Goal status: Deferred        PLAN: PT FREQUENCY: 1-2x/week   PT DURATION: 8 weeks   PLANNED INTERVENTIONS: Therapeutic exercises, Therapeutic activity, Neuromuscular re-education, Balance training, Gait training, Patient/Family education, Joint manipulation, Joint mobilization, Stair training, Orthotic/Fit training, DME instructions, Aquatic Therapy, Dry Needling, Cryotherapy, Moist heat, Manual therapy, and Re-evaluation   PLAN FOR NEXT SESSION: Progress LE strength with quadruped and begin static balance exercises    Bradly Chris PT, DPT 06/28/2022, 4:20 PM

## 2022-07-01 ENCOUNTER — Encounter: Payer: Self-pay | Admitting: Physical Therapy

## 2022-07-01 ENCOUNTER — Ambulatory Visit: Payer: Medicare Other | Admitting: Physical Therapy

## 2022-07-01 DIAGNOSIS — Z8673 Personal history of transient ischemic attack (TIA), and cerebral infarction without residual deficits: Secondary | ICD-10-CM

## 2022-07-01 DIAGNOSIS — R262 Difficulty in walking, not elsewhere classified: Secondary | ICD-10-CM

## 2022-07-01 DIAGNOSIS — I6930 Unspecified sequelae of cerebral infarction: Secondary | ICD-10-CM

## 2022-07-01 NOTE — Therapy (Addendum)
OUTPATIENT PHYSICAL THERAPY TREATMENT  NOTE  Patient Name: Anthony Evans MRN: [id number] DOB:[birth year], 71 y.o., male Today's Date: 06/23/2022  PCP: Dr. Tracie Harrier  REFERRING PROVIDER: Dr. Allene Dillon   END OF SESSION:    PT End of Session - 07/01/22 1545    Visit Number 14   Number of Visits 16    Date for PT Re-Evaluation 08/18/22    Authorization Type [employer] Medicare    PT Start Time 1545   PT Stop Time 1630   PT Time Calculation (min) 45 min    Equipment Utilized During Treatment Gait belt    Activity Tolerance Patient tolerated treatment well    Behavior During Therapy WFL for tasks assessed/performed               Past Medical History:  Diagnosis Date   Allergy    Arthritis    psoriatic   Back pain    Cataract    Degenerative disc disease    Diverticulitis    GERD (gastroesophageal reflux disease)    Hypertension    Hypothyroidism    Psoriasis    Reflux    Seizures ([place])    last seizure about 10 years ago   Stroke [facility])    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   Thalassemia    Thyroid disease    Past Surgical History:  Procedure Laterality Date   BACK SURGERY     screws and plate in c4 and c5   CATARACT EXTRACTION W/PHACO Left 05/15/2015   Procedure: CATARACT EXTRACTION PHACO AND INTRAOCULAR LENS PLACEMENT ([place]);  Surgeon: Lyla Glassing, MD;  Location: [facility] ORS;  Service: Ophthalmology;  Laterality: Left;  [place]: 2:40.9 AP: 17.3 CDE: 27.88   CERVICAL FUSION     COLONOSCOPY WITH PROPOFOL N/A 06/14/2016   Procedure: COLONOSCOPY WITH PROPOFOL;  Surgeon: Manya Silvas, MD;  Location: [facility] ENDOSCOPY;  Service: Endoscopy;  Laterality: N/A;   COLONOSCOPY WITH PROPOFOL N/A 03/15/2022   Procedure: COLONOSCOPY WITH PROPOFOL;  Surgeon: [name], DO;  Location: [facility] ENDOSCOPY;  Service: Gastroenterology;  Laterality: N/A;   CORRECTION HAMMER TOE     ESOPHAGOGASTRODUODENOSCOPY N/A 06/11/2022   Procedure: ESOPHAGOGASTRODUODENOSCOPY (EGD);  Surgeon: [name], DO;  Location: [facility] ENDOSCOPY;  Service: Gastroenterology;  Laterality: N/A;   ESOPHAGOGASTRODUODENOSCOPY (EGD) WITH PROPOFOL N/A 03/15/2022   Procedure: ESOPHAGOGASTRODUODENOSCOPY (EGD) WITH PROPOFOL;  Surgeon: [name], DO;  Location: [place];  Service: Gastroenterology;  Laterality: N/A;   EYE SURGERY Right    cataract   FRACTURE SURGERY     HIP FRACTURE SURGERY Right    pins in hip   JOINT REPLACEMENT     KNEE ARTHROPLASTY Left    total knee replacement   Patient Active Problem List   Diagnosis Date Noted   Syncope 11/05/2019   C2 cervical fracture ([place]) 09/14/2018   Hypertension 03/08/2018   GERD (gastroesophageal reflux disease) 03/08/2018   Thyroid disease 03/08/2018   Degenerative disc disease 03/08/2018   Psoriatic arthritis ([place]) 03/08/2018   Back pain 03/08/2018   Thalassemia minor 03/08/2018   Seizures ([place]) 03/08/2018   H/O cataract 03/08/2018   Seasonal allergies 03/08/2018    REFERRING DIAG: Imbalance   THERAPY DIAG:  Difficulty in walking, not elsewhere classified  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 Ladona Ridgel    HPI The patient is a 71 year old gentleman followed by Dr. Joya Salm at [facility] neurosurgery and presents  today for follow-up of low back pain with radiation into the left anterior thigh consistent with an L3 radiculitis. His symptoms began around the fall 2016 without preceding injury or trauma. His symptoms are worsened with heavy lifting, bending, twisting, and occasionally with standing. He has some relief with sitting and rest. Chiropractic manipulation with Dr. Laveda Abbe often provides moderate to good relief. Medications have included Mobic 7.5 mg twice daily as needed, Flexeril 5 mg as needed, and an occasional hydrocodone. He prefers to avoid narcotics if possible.   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 He is now on permanent disability.    He was last evaluated on 06/27/2020 at which time he was experiencing left low back pain rating to the left anterior thigh consistent with an L3 radiculitis for which he was scheduled for another epidural steroid injection. He was to continue with meloxicam and referred for balance therapy at [facility].       At today's visit ports that he has experienced a flare of pain radiating from the left low back into the left anterior thigh stopping at the knee over the last several months and is hopeful to schedule for another epidural steroid injection. He continues meloxicam. He also continues to experience some imbalance and states that he never heard from [facility] for balance therapy I will refer him today. On exam today he is without any noted weakness in the left lower extremity.   PRECAUTIONS: FALLS  SUBJECTIVE: Pt states that he is feeling increased muscle soreness from last session to point where he was not able to go grocery shopping.   PAIN:  Are you having pain? 5/10    OBJECTIVE:   VITALS: BP 134/88 SpO2 97 HR 106      DIAGNOSTIC FINDINGS: CLINICAL DATA:  71 year old male status post fall backwards with subsequent weakness.   EXAM: MRI LUMBAR SPINE WITHOUT CONTRAST   TECHNIQUE: Multiplanar, multisequence MR imaging of the lumbar spine was performed. No intravenous contrast was administered.   COMPARISON:  Lumbar MRI 05/17/2019. Lumbar spine CT 10/10/2017.   FINDINGS: Segmentation: Transitional anatomy. For the purposes of this report hypoplastic or absent ribs are designated at T12 (full size ribs at T11) resulting in the lowest open disc space at L5-S1, and this is the same numbering system used on the June MRI. Correlation with radiographs is recommended prior to any operative intervention.   Alignment: Stable since June. Moderate chronic levoconvex lumbar scoliosis with superimposed mild grade 1 anterolisthesis  of L4 on L5.   Vertebrae: Chronic T12 compression fracture is stable with mild-to-moderate loss of vertebral height. Superimposed mild left L1 superior endplate compression or large Schmorl's node. Additional chronic endplate changes in the lower lumbar spine. No marrow edema or evidence of acute osseous abnormality. Normal background bone marrow signal. Stable and benign appearing chronic cystic appearing bone lesion in the medial right iliac on series 14, image 36. Intact visible sacrum and SI joints.   Conus medullaris and cauda equina: Conus extends to the T12-L1 level. No lower spinal cord or conus signal abnormality.   Paraspinal and other soft tissues: Stable since June.   Disc levels:   L1-L2 with no stable visible lower thoracic spine through spinal stenosis. There is multilevel chronic moderate and severe lower thoracic and upper lumbar neural foraminal stenosis greater on the right.   L2-L3: Moderate multifactorial spinal stenosis with severe right lateral recess stenosis is stable related to rightward disc osteophyte complex and posterior  element hypertrophy. Mild left and severe right L2 foraminal stenosis is stable.   L3-L4: Moderate to severe multifactorial spinal and right lateral recess stenosis is stable related to circumferential disc osteophyte complex and moderate to severe posterior element hypertrophy. Moderate to severe left and moderate right L3 foraminal stenosis is stable.   L4-L5: Moderate to severe chronic spinal and left greater than right lateral recess stenosis appears stable in the setting of grade 1 anterolisthesis. A small subligamentous synovial cyst has developed on the left (series 11, image 27), although does not exacerbate the stenosis at this time. Moderate to severe left and mild right L4 foraminal stenosis is stable.   L5-S1: Left side assimilation joint with left far lateral disc osteophyte complex and moderate posterior element  hypertrophy again noted. No spinal or lateral recess stenosis. Moderate to severe left and mild right L5 foraminal stenosis is stable.   IMPRESSION: 1. Stable MRI appearance of the lumbar spine since June. 2. Transitional anatomy. Correlation with radiographs is recommended prior to any operative intervention. 3. Chronic levoconvex lumbar scoliosis and grade 1 anterolisthesis at L4-L5 with widespread advanced disc, endplate, and posterior element degeneration. 4. Moderate to severe multifactorial spinal and lateral recess stenosis is stable from L2-L3 through L4-L5. 5. Widespread lower thoracic and lumbar moderate to severe neural foraminal stenosis is stable.     Electronically Signed   By: Genevie Ann M.D.   On: 11/05/2019 02:28   PATIENT SURVEYS:  FOTO 47/51   COGNITION:           Overall cognitive status: Within functional limits for tasks assessed                          SENSATION: Light touch: Impaired on RLE and RUE    MUSCLE LENGTH: Deferred to next visit  Hamstrings: Right NT deg; Left NT deg Marcello Moores test: Right NT deg; Left NT deg   POSTURE:  Left sided trunk  lean    PALPATION: N/a   LE ROM:     Active  Right 04/23/2022 Left 04/23/2022  Hip flexion 120 120  Hip extension 30 30  Hip abduction 45 45  Hip adduction 30 30  Hip internal rotation      Hip external rotation      Knee flexion 135 135  Knee extension 0 0  Ankle dorsiflexion 5 20  Ankle plantarflexion 50 50  Ankle inversion 35 35  Ankle eversion 15 15   (Blank rows = not tested)      LE MMT:   MMT Right 04/23/2022 Left 04/23/2022  Hip flexion 5 5  Hip extension      Hip abduction      Hip adduction      Hip internal rotation      Hip external rotation      Knee flexion 5 5  Knee extension 5 5  Ankle dorsiflexion 2+ 5  Ankle plantarflexion 5 5  Ankle inversion      Ankle eversion       (Blank rows = not tested)      LOWER EXTREMITY SPECIAL TESTS: NT     FUNCTIONAL TESTS:     5 Times Sit to Stand: 0 reps  (Individuals with times that exceed the listed time have worse than average performance - predictive of recurrent falls in healthy community-living subjects)         - 60-69 y.o. 11.4 sec         -  42-79 y.o. 12.6 sec         - 80-89 y.o. 14.8 sec                 30 sec chair stands: 0 reps   <12 reps for Males 70-74   *A below average score indicates a risk for falls   STEADI 4-Stage Balance Test: (inability to maintain tandem stance for 10 sec = increased risk for falls) - feet together, eyes open, noncompliant surface: 10/10 sec - semi-tandem stance, eyes open, noncompliant surface: 10/10 sec - tandem stances, eyes open, noncompliant surface: 3/ 0 sec - single leg stance: NT        GAIT: Distance walked: Right sided hemiplegia, decreased stance time on RLE and decreased step length on LLE  Assistive device utilized: None Level of assistance: Complete Independence Comments: H/o of left sided CVA as a child        TODAY'S TREATMENT:  07/01/22: Nu-Step Level 10 for seat and arms and level 4 resistance - 5 min  Sit to Stand from 18 inch chair height 3 x 5  Dead Bug with bent knees 1 x 10  -Pt exhibits difficulty with coordinating exercise             Double Bent Knee Leg Lifts 1 x 10             Double Bent Knee Leg Lifts with knee extension 2 x 5              06/28/22: Nu-Step Level 10 for seat and arms and level 4 resistance - 5 min  Lower Trunk Rotations 3 x 10  Double Bent Knee Lifts 3 x 10  -min VC to decrease eccentric portion of exercise  Supine Bridges 3 x 10  -min VC to decrease eccentric portion of exercise  Sit to Stands from 18" chair height with 1 UE support 3 x 10 Standing Hip Abduction with 1 UE support 2 x 10                 06/23/22: Nu-Step Level 10 for seat and arms 5 min  Sit to Stand 1 x 10 without use of UE support  Sit to Stand 1 x 6 with intermittent use of 1 UE  Sit to Stand 1 x 4 with intermittent use  of 1 UE  Supine Double Bent Knee Leg Lift 3 x 10  -Pt instructed to attempt pursed lipped breathing to maintain breath  Quadruped Hip Extension 1 x 10 -Pt struggle to complete exercises  Quadruped Shoulder Flexion 2 x 10    06/21/22: Nu-Step Level 11 for seat and arms 5 min  Modified Damaso Stretch with #5 AW and overpressure 3 x 60 sec  Sit to Stand with 1 UE support  3 x 10  -min to mod VC to increase depth of sit to make   FGA: 13 -Mild Impairments: Gait with eyes closed, vertical and horizontal head turns, step over obstacle, change in gait speed, gait level surface, gait with pivot turns  -Moderate Impairments: Ambulating Backwards + Stairs     PATIENT EDUCATION:  Education details: Form and technique for appropriate exercise  Person educated: Patient Education method: Explanation Education comprehension: verbalized understanding, returned demonstration, and verbal cues required     HOME EXERCISE PROGRAM: Access Code: [email or web address] URL: [email or web address] Date: 07/01/2022 Prepared by: Bradly Chris  Exercises - Sit to Stand with Counter Support  - 1 x daily - 3 x weekly -  3 sets - 5 reps - Seated Sciatic Tensioner  - 1 x daily - 7 x weekly - 1 sets - 10 reps - Prone Quadriceps Stretch with Strap  - 1 x daily - 7 x weekly - 1 sets - 3 reps - 30 hold - Standing Heel Raise with Support  - 1 x daily - 3 x weekly - 3 sets - 10 reps - Forward Bending Hip Flexion with Flat Lumbar Spine  - 1 x daily - 3 x weekly - 3 sets - 10 reps - Prone Press Up On Elbows  - 1 x daily - 7 x weekly - 1 sets - 3 reps - 30 hold - Supine Dynamic Modified Levern Quad and Hip Flexor Dynamic Stretch  - 1 x daily - 7 x weekly - 1 sets - 3 reps - 30 hold - Supine Double Bent Leg Lift  - 1 x daily - 3 x weekly - 3 sets - 10 reps - Standing Hip Abduction with Counter Support  - 1 x daily - 3 x weekly - 2 sets - 10 reps   ASSESSMENT:   CLINICAL IMPRESSION:  Pt exhibits increase in  abdominal strength and LE strength with ability to perform leg lift with knee extension and sit to stands from a decreased surface height and without UE support. He is nearing the end of his POC and he was educated about reassessment of goals as a way to determine progress and that if he continues to plateau with progress then he would likely be discharged with HEP as he would likely have reached his full rehab potential. He will continue to benefit from skilled PT to improve static and dynamic balance and LE strength to decrease his risk for falling.    OBJECTIVE IMPAIRMENTS Abnormal gait, decreased balance, difficulty walking, decreased ROM, decreased strength, impaired flexibility, impaired sensation, and pain.    ACTIVITY LIMITATIONS shopping and community activity.    PERSONAL FACTORS Fitness, Past/current experiences, Time since onset of injury/illness/exacerbation, and 3+ comorbidities: h/o stroke, psoriatic arthritis, degenerative disc disease  are also affecting patient's functional outcome.      REHAB POTENTIAL: Fair chronicity of right hemiparesis and reduced familial and social support    CLINICAL DECISION MAKING: Stable/uncomplicated   EVALUATION COMPLEXITY: Low     GOALS: Goals reviewed with patient? No   SHORT TERM GOALS: Target date: 08/17/2022     Pt will be independent with HEP in order to improve strength and balance in order to decrease fall risk and improve function at home and work. Baseline: n/A 06/16/22: Performing exercises independently  Goal status: ONGoing   2.  Patient will perform sit to stand without use of UE support to demonstrate improve LE strength.  Baseline: Unable to perform without use of hands 06/16/22: Able to perform without use of UE Goal status: ACHIEVED        LONG TERM GOALS: Target date: 08/18/2022     Patient will have improved function and activity level as evidenced by an increase in FOTO score to predicted score. Baseline: 47/51  05/31/22: 46 06/16/22: 45  Goal status: ONGOING    2.  Patient will improve 5 x STS to <11.4 sec to demonstrate LE strength that places him at a decreased risk for falls. Baseline: Unable to perform because needs to use UE support 06/16/22: Only able to perform 2 reps Goal status: Partially met    3. Patient will hold tandem stance for >=10 sec to exhibit improved static  balance to decrease his risk of falling.   Baseline: Tandem stance R/L 3 sec/0 sec  06/16/22: Tandem R/L 10 sec /8 sec; unable to perform full tandem with RLE as back foot  Goal status: Partially met    4.  Patient will improve his FGA score my 5 points to demonstrate a minimal significant improvement in his dynamic balance thus decreasing his risk for falling.  Baseline: 13/30 06/21/22: 16/30 Goal status: Deferred        PLAN: PT FREQUENCY: 1-2x/week   PT DURATION: 8 weeks   PLANNED INTERVENTIONS: Therapeutic exercises, Therapeutic activity, Neuromuscular re-education, Balance training, Gait training, Patient/Family education, Joint manipulation, Joint mobilization, Stair training, Orthotic/Fit training, DME instructions, Aquatic Therapy, Dry Needling, Cryotherapy, Moist heat, Manual therapy, and Re-evaluation   PLAN FOR NEXT SESSION: Progress LE strength with quadruped and begin static balance exercises    Bradly Chris PT, DPT 07/01/2022, 4:02 PM

## 2022-07-05 ENCOUNTER — Encounter: Payer: Self-pay | Admitting: Physical Therapy

## 2022-07-05 ENCOUNTER — Ambulatory Visit: Payer: Medicare Other | Admitting: Physical Therapy

## 2022-07-05 DIAGNOSIS — R262 Difficulty in walking, not elsewhere classified: Secondary | ICD-10-CM

## 2022-07-05 DIAGNOSIS — I6930 Unspecified sequelae of cerebral infarction: Secondary | ICD-10-CM
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
## 2022-07-07 ENCOUNTER — Telehealth: Payer: Self-pay | Admitting: Physical Therapy

## 2022-07-07 NOTE — Telephone Encounter (Signed)
Called pt to see if he wanted to come in earlier because of increased availability. Pt did not want to come in today.

## 2022-07-08 ENCOUNTER — Ambulatory Visit: Payer: Medicare Other | Admitting: Physical Therapy

## 2022-07-12 ENCOUNTER — Ambulatory Visit: Payer: Medicare Other | Admitting: Physical Therapy

## 2022-07-14 ENCOUNTER — Ambulatory Visit: Payer: Medicare Other | Admitting: Physical Therapy

## 2022-07-19 ENCOUNTER — Encounter: Payer: Self-pay | Admitting: Physical Therapy

## 2022-07-19 ENCOUNTER — Ambulatory Visit: Payer: Medicare Other | Attending: Family Medicine | Admitting: Physical Therapy

## 2022-07-19 DIAGNOSIS — R262 Difficulty in walking, not elsewhere classified: Secondary | ICD-10-CM | POA: Insufficient documentation

## 2022-07-19 DIAGNOSIS — I6930 Unspecified sequelae of cerebral infarction: Secondary | ICD-10-CM | POA: Diagnosis present

## 2022-07-19 NOTE — Therapy (Signed)
OUTPATIENT PHYSICAL THERAPY TREATMENT  NOTE  Patient Name: Anthony Evans MRN: [id number] DOB:1951/06/19, 71 y.o., male Today's Date: 06/23/2022  PCP: Dr. Tracie Harrier  REFERRING PROVIDER: Dr. Allene Dillon   END OF SESSION:    PT End of Session - 07/19/22 1639     Visit Number 16    Number of Visits 16    Date for PT Re-Evaluation 08/18/22    Authorization Type [facility] Medicare    Equipment Utilized During Treatment Gait belt    Activity Tolerance Patient tolerated treatment well    Behavior During Therapy [facility] for tasks assessed/performed               Past Medical History:  Diagnosis Date   Allergy    Arthritis    psoriatic   Back pain    Cataract    Degenerative disc disease    Diverticulitis    GERD (gastroesophageal reflux disease)    Hypertension    Hypothyroidism    Psoriasis    Reflux    Seizures ([place])    last seizure about 10 years ago   Stroke [facility])    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   Thalassemia    Thyroid disease    Past Surgical History:  Procedure Laterality Date   BACK SURGERY     screws and plate in c4 and c5   CATARACT EXTRACTION W/PHACO Left 05/15/2015   Procedure: CATARACT EXTRACTION PHACO AND INTRAOCULAR LENS PLACEMENT ([place]);  Surgeon: Lyla Glassing, MD;  Location: [facility] ORS;  Service: Ophthalmology;  Laterality: Left;  [place]: 2:40.9 AP: 17.3 CDE: 27.88   CERVICAL FUSION     COLONOSCOPY WITH PROPOFOL N/A 06/14/2016   Procedure: COLONOSCOPY WITH PROPOFOL;  Surgeon: Manya Silvas, MD;  Location: [facility] ENDOSCOPY;  Service: Endoscopy;  Laterality: N/A;   COLONOSCOPY WITH PROPOFOL N/A 03/15/2022   Procedure: COLONOSCOPY WITH PROPOFOL;  Surgeon: [name], DO;  Location: [facility] ENDOSCOPY;  Service: Gastroenterology;  Laterality: N/A;   CORRECTION HAMMER TOE     ESOPHAGOGASTRODUODENOSCOPY N/A 06/11/2022   Procedure: ESOPHAGOGASTRODUODENOSCOPY (EGD);  Surgeon: [name], DO;  Location: [facility] ENDOSCOPY;  Service: Gastroenterology;   Laterality: N/A;   ESOPHAGOGASTRODUODENOSCOPY (EGD) WITH PROPOFOL N/A 03/15/2022   Procedure: ESOPHAGOGASTRODUODENOSCOPY (EGD) WITH PROPOFOL;  Surgeon: [name], DO;  Location: [place];  Service: Gastroenterology;  Laterality: N/A;   EYE SURGERY Right    cataract   FRACTURE SURGERY     HIP FRACTURE SURGERY Right    pins in hip   JOINT REPLACEMENT     KNEE ARTHROPLASTY Left    total knee replacement   Patient Active Problem List   Diagnosis Date Noted   Syncope 11/05/2019   C2 cervical fracture ([place]) 09/14/2018   Hypertension 03/08/2018   GERD (gastroesophageal reflux disease) 03/08/2018   Thyroid disease 03/08/2018   Degenerative disc disease 03/08/2018   Psoriatic arthritis ([place]) 03/08/2018   Back pain 03/08/2018   Thalassemia minor 03/08/2018   Seizures ([place]) 03/08/2018   H/O cataract 03/08/2018   Seasonal allergies 03/08/2018    REFERRING DIAG: Imbalance   THERAPY DIAG:  Difficulty in walking, not elsewhere classified  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 Ladona Ridgel    HPI The patient is a 71 year old gentleman followed by Dr. Joya Salm at [facility] and presents today for follow-up of low back pain with radiation into the left anterior thigh consistent with an L3 radiculitis.  His symptoms began around the fall 2016 without preceding injury or trauma. His symptoms are worsened with heavy lifting, bending, twisting, and occasionally with standing. He has some relief with sitting and rest. Chiropractic manipulation with Dr. Laveda Abbe often provides moderate to good relief. Medications have included Mobic 7.5 mg twice daily as needed, Flexeril 5 mg as needed, and an occasional hydrocodone. He prefers to avoid narcotics if possible.   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 He is now on permanent disability.    He was  last evaluated on 06/27/2020 at which time he was experiencing left low back pain rating to the left anterior thigh consistent with an L3 radiculitis for which he was scheduled for another epidural steroid injection. He was to continue with meloxicam and referred for balance therapy at [facility].       At today's visit ports that he has experienced a flare of pain radiating from the left low back into the left anterior thigh stopping at the knee over the last several months and is hopeful to schedule for another epidural steroid injection. He continues meloxicam. He also continues to experience some imbalance and states that he never heard from [facility] for balance therapy I will refer him today. On exam today he is without any noted weakness in the left lower extremity.   PRECAUTIONS: FALLS  SUBJECTIVE: Pt reports that his low back is feeling better. He has been preoccupied lately so he has not been doing his exercises.   PAIN:  Are you having pain? No pain    OBJECTIVE:   VITALS: BP 134/88 SpO2 97 HR 106      DIAGNOSTIC FINDINGS: CLINICAL DATA:  71 year old male status post fall backwards with subsequent weakness.   EXAM: MRI LUMBAR SPINE WITHOUT CONTRAST   TECHNIQUE: Multiplanar, multisequence MR imaging of the lumbar spine was performed. No intravenous contrast was administered.   COMPARISON:  Lumbar MRI 05/17/2019. Lumbar spine CT 10/10/2017.   FINDINGS: Segmentation: Transitional anatomy. For the purposes of this report hypoplastic or absent ribs are designated at T12 (full size ribs at T11) resulting in the lowest open disc space at L5-S1, and this is the same numbering system used on the June MRI. Correlation with radiographs is recommended prior to any operative intervention.   Alignment: Stable since June. Moderate chronic levoconvex lumbar scoliosis with superimposed mild grade 1 anterolisthesis of L4 on L5.   Vertebrae: Chronic T12 compression fracture is stable  with mild-to-moderate loss of vertebral height. Superimposed mild left L1 superior endplate compression or large Schmorl's node. Additional chronic endplate changes in the lower lumbar spine. No marrow edema or evidence of acute osseous abnormality. Normal background bone marrow signal. Stable and benign appearing chronic cystic appearing bone lesion in the medial right iliac on series 14, image 36. Intact visible sacrum and SI joints.   Conus medullaris and cauda equina: Conus extends to the T12-L1 level. No lower spinal cord or conus signal abnormality.   Paraspinal and other soft tissues: Stable since June.   Disc levels:   L1-L2 with no stable visible lower thoracic spine through spinal stenosis. There is multilevel chronic moderate and severe lower thoracic and upper lumbar neural foraminal stenosis greater on the right.   L2-L3: Moderate multifactorial spinal stenosis with severe right lateral recess stenosis is stable related to rightward disc osteophyte complex and posterior element hypertrophy. Mild left and severe right L2 foraminal stenosis is stable.   L3-L4: Moderate to severe multifactorial  spinal and right lateral recess stenosis is stable related to circumferential disc osteophyte complex and moderate to severe posterior element hypertrophy. Moderate to severe left and moderate right L3 foraminal stenosis is stable.   L4-L5: Moderate to severe chronic spinal and left greater than right lateral recess stenosis appears stable in the setting of grade 1 anterolisthesis. A small subligamentous synovial cyst has developed on the left (series 11, image 27), although does not exacerbate the stenosis at this time. Moderate to severe left and mild right L4 foraminal stenosis is stable.   L5-S1: Left side assimilation joint with left far lateral disc osteophyte complex and moderate posterior element hypertrophy again noted. No spinal or lateral recess stenosis. Moderate  to severe left and mild right L5 foraminal stenosis is stable.   IMPRESSION: 1. Stable MRI appearance of the lumbar spine since June. 2. Transitional anatomy. Correlation with radiographs is recommended prior to any operative intervention. 3. Chronic levoconvex lumbar scoliosis and grade 1 anterolisthesis at L4-L5 with widespread advanced disc, endplate, and posterior element degeneration. 4. Moderate to severe multifactorial spinal and lateral recess stenosis is stable from L2-L3 through L4-L5. 5. Widespread lower thoracic and lumbar moderate to severe neural foraminal stenosis is stable.     Electronically Signed   By: Genevie Ann M.D.   On: 11/05/2019 02:28   PATIENT SURVEYS:  FOTO 47/51   COGNITION:           Overall cognitive status: Within functional limits for tasks assessed                          SENSATION: Light touch: Impaired on RLE and RUE    MUSCLE LENGTH: Deferred to next visit  Hamstrings: Right NT deg; Left NT deg Marcello Moores test: Right NT deg; Left NT deg   POSTURE:  Left sided trunk  lean    PALPATION: N/a   LE ROM:     Active  Right 04/23/2022 Left 04/23/2022  Hip flexion 120 120  Hip extension 30 30  Hip abduction 45 45  Hip adduction 30 30  Hip internal rotation      Hip external rotation      Knee flexion 135 135  Knee extension 0 0  Ankle dorsiflexion 5 20  Ankle plantarflexion 50 50  Ankle inversion 35 35  Ankle eversion 15 15   (Blank rows = not tested)      LE MMT:   MMT Right 04/23/2022 Left 04/23/2022  Hip flexion 5 5  Hip extension      Hip abduction      Hip adduction      Hip internal rotation      Hip external rotation      Knee flexion 5 5  Knee extension 5 5  Ankle dorsiflexion 2+ 5  Ankle plantarflexion 5 5  Ankle inversion      Ankle eversion       (Blank rows = not tested)      LOWER EXTREMITY SPECIAL TESTS: NT     FUNCTIONAL TESTS:    5 Times Sit to Stand: 0 reps  (Individuals with times that exceed  the listed time have worse than average performance - predictive of recurrent falls in healthy community-living subjects)         - 60-69 y.o. 11.4 sec         - 70-79 y.o. 12.6 sec         - 80-89 y.o. 14.8  sec                 30 sec chair stands: 0 reps   <12 reps for Males 70-74   *A below average score indicates a risk for falls   STEADI 4-Stage Balance Test: (inability to maintain tandem stance for 10 sec = increased risk for falls) - feet together, eyes open, noncompliant surface: 10/10 sec - semi-tandem stance, eyes open, noncompliant surface: 10/10 sec - tandem stances, eyes open, noncompliant surface: 3/ 0 sec - single leg stance: NT        GAIT: Distance walked: Right sided hemiplegia, decreased stance time on RLE and decreased step length on LLE  Assistive device utilized: None Level of assistance: Complete Independence Comments: H/o of left sided CVA as a child        TODAY'S TREATMENT:  07/19/22:  THEREX:  Nu-Step Seat and arms at level 11 with resistance at level 2 for 5 min   Half tandem R/L 10 sec/10 sec  Tandem R/L 0 sec   FOTO: 55/100 with 51 as target  Educated on stepping down off curb using effected limb and cane and stepping up curb using unaffected limb.  -Pt demonstrated this by performing independently   07/05/22:  THEREX   Nu-Step Level 13 for seat and arms and level 1 resistance- 5 min  Prone Press Up 3 sec hold x 10  Quadruped with support on 2 ft foam block Y's 3 x 10  Lower Trunk Rotation 3 x 10 with 3 sec hold  HS stretch 3 x 30 sec  -min to mod VC to straighten knee  MANUA: Pt provides overpressure   Supine HS Stretch 2 x 60 sec  Supine Hip Abduction Stretch 2 x 60 sec  Supine KTC Stretch 2 x 60  Supine Calf Stretch 2 x 60 sec    07/01/22: Nu-Step Level 10 for seat and arms and level 4 resistance - 5 min  Sit to Stand from 18 inch chair height 3 x 5  Dead Bug with bent knees 1 x 10  -Pt exhibits difficulty with  coordinating exercise             Double Bent Knee Leg Lifts 1 x 10             Double Bent Knee Leg Lifts with knee extension 2 x 5                                PATIENT EDUCATION:  Education details: Form and technique for appropriate exercise  Person educated: Patient Education method: Explanation Education comprehension: verbalized understanding, returned demonstration, and verbal cues required     HOME EXERCISE PROGRAM: Access Code: [email or web address] URL: [email or web address] Date: 07/05/2022 Prepared by: Bradly Chris  Exercises - Sit to Stand with Counter Support  - 1 x daily - 3 x weekly - 3 sets - 5 reps - Seated Sciatic Tensioner  - 1 x daily - 7 x weekly - 1 sets - 10 reps - Prone Quadriceps Stretch with Strap  - 1 x daily - 7 x weekly - 1 sets - 3 reps - 30 hold - Standing Heel Raise with Support  - 1 x daily - 3 x weekly - 3 sets - 10 reps - Forward Bending Hip Flexion with Flat Lumbar Spine  - 1 x daily - 3 x weekly - 3 sets - 10 reps - Prone Press  Up On Elbows  - 1 x daily - 7 x weekly - 1 sets - 3 reps - 30 hold - Supine Dynamic Modified Karl Pock and Hip Flexor Dynamic Stretch  - 1 x daily - 7 x weekly - 1 sets - 3 reps - 30 hold - Supine Double Bent Leg Lift  - 1 x daily - 3 x weekly - 3 sets - 10 reps - Seated Hamstring Stretch  - 1 x daily - 7 x weekly - 1 sets - 3 reps - 60 hold  ASSESSMENT:   CLINICAL IMPRESSION:  Pt continues to show deficits with static balance and LE strength that has not improved significantly since the initial session. PT continues to recommend pt use rollator for additional BUE support and educated on how to negotiate curbs using SPC. Due to limited progress, pt discharged from PT and he will continue independently with HEP.     OBJECTIVE IMPAIRMENTS Abnormal gait, decreased balance, difficulty walking, decreased ROM, decreased strength, impaired flexibility, impaired sensation, and pain.    ACTIVITY LIMITATIONS  shopping and community activity.    PERSONAL FACTORS Fitness, Past/current experiences, Time since onset of injury/illness/exacerbation, and 3+ comorbidities: h/o stroke, psoriatic arthritis, degenerative disc disease  are also affecting patient's functional outcome.      REHAB POTENTIAL: Fair chronicity of right hemiparesis and reduced familial and social support    CLINICAL DECISION MAKING: Stable/uncomplicated   EVALUATION COMPLEXITY: Low     GOALS: Goals reviewed with patient? No   SHORT TERM GOALS: Target date: 08/17/2022     Pt will be independent with HEP in order to improve strength and balance in order to decrease fall risk and improve function at home and work. Baseline: n/A 06/16/22: Performing exercises independently  Goal status: ONGoing   2.  Patient will perform sit to stand without use of UE support to demonstrate improve LE strength.  Baseline: Unable to perform without use of hands 06/16/22: Able to perform without use of UE Goal status: ACHIEVED        LONG TERM GOALS: Target date: 08/18/2022     Patient will have improved function and activity level as evidenced by an increase in FOTO score to predicted score. Baseline: 47/51 05/31/22: 46 06/16/22: 45 07/19/22: 55/51 Goal status: ACHIEVED    2.  Patient will improve 5 x STS to <11.4 sec to demonstrate LE strength that places him at a decreased risk for falls. Baseline: Unable to perform because needs to use UE support 06/16/22: Only able to perform 2 reps 07/19/22: 5 reps in 23 sec  Goal status: Not Met    3. Patient will hold tandem stance for >=10 sec to exhibit improved static balance to decrease his risk of falling.   Baseline: Tandem stance R/L 3 sec/0 sec  06/16/22: Tandem R/L 10 sec /8 sec; unable to perform full tandem with RLE as back foot  07/19/22: half tandemR/L 10 SEC, unable to do tandem  Goal status: Not Met    4.  Patient will improve his FGA score my 5 points to demonstrate a minimal significant  improvement in his dynamic balance thus decreasing his risk for falling.  Baseline: 13/30 06/21/22: 16/30 Goal status: Deferred        PLAN: PT FREQUENCY: 1-2x/week   PT DURATION: 8 weeks   PLANNED INTERVENTIONS: Therapeutic exercises, Therapeutic activity, Neuromuscular re-education, Balance training, Gait training, Patient/Family education, Joint manipulation, Joint mobilization, Stair training, Orthotic/Fit training, DME instructions, Aquatic Therapy, Dry Needling, Cryotherapy, Moist  heat, Manual therapy, and Re-evaluation   PLAN FOR NEXT SESSION:  Discharge for PT    Bradly Chris PT, DPT 07/19/2022, 4:40 PM

## 2022-07-22 ENCOUNTER — Ambulatory Visit: Payer: Medicare Other | Admitting: Physical Therapy

## 2022-07-26 ENCOUNTER — Ambulatory Visit: Payer: Medicare Other | Admitting: Physical Therapy

## 2022-07-29 ENCOUNTER — Encounter: Payer: Medicare Other | Admitting: Physical Therapy

## 2022-08-02 ENCOUNTER — Encounter: Payer: Medicare Other | Admitting: Physical Therapy

## 2022-08-03 ENCOUNTER — Encounter: Payer: Self-pay | Admitting: Licensed Clinical Social Worker

## 2022-08-03 ENCOUNTER — Inpatient Hospital Stay: Payer: Medicare Other | Attending: Oncology | Admitting: Licensed Clinical Social Worker

## 2022-08-03 ENCOUNTER — Inpatient Hospital Stay: Payer: Medicare Other

## 2022-08-03 DIAGNOSIS — Z803 Family history of malignant neoplasm of breast: Secondary | ICD-10-CM | POA: Diagnosis not present

## 2022-08-03 DIAGNOSIS — Z86010 Personal history of colonic polyps: Secondary | ICD-10-CM | POA: Diagnosis not present

## 2022-08-03 DIAGNOSIS — Z8042 Family history of malignant neoplasm of prostate: Secondary | ICD-10-CM | POA: Diagnosis not present

## 2022-08-03 NOTE — Progress Notes (Signed)
REFERRING PROVIDER: Geanie Kenning, PA-C [place],  [place] [id number]  PRIMARY PROVIDER:  Tracie Harrier, MD  PRIMARY REASON FOR VISIT:  1. Personal history of colonic polyps   2. Family history of breast cancer   3. Family history of prostate cancer      HISTORY OF PRESENT ILLNESS:   Mr. Langdon, a 71 y.o. male, was seen for a [place] cancer genetics consultation at the request of Dr. Chauncey Mann due to a family history of cancer and personal history of colon polyps.  Mr. Fandrich presents to clinic today to discuss the possibility of a hereditary predisposition to cancer, genetic testing, and to further clarify his future cancer risks, as well as potential cancer risks for family members.   CANCER HISTORY:  Mr. Maiden is a 71 y.o. male with no personal history of cancer.    RISK FACTORS:  Colonoscopy:  2009- 5 tubular adenomas 2014- 4 tubular adenomas, 2 hyperplastic polyps 2017- 8 tubular adenomas, 2 hyperplastic polyps 2023- 12 tubular adenomas, 1 tubulovillous adenoma, multiple gastric polyps   Past Medical History:  Diagnosis Date   Allergy    Arthritis    psoriatic   Back pain    Cataract    Degenerative disc disease    Diverticulitis    GERD (gastroesophageal reflux disease)    Hypertension    Hypothyroidism    Psoriasis    Reflux    Seizures ([facility])    last seizure about 10 years ago   Stroke [facility])    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   Thalassemia    Thyroid disease     Past Surgical History:  Procedure Laterality Date   BACK SURGERY     screws and plate in c4 and c5   CATARACT EXTRACTION W/PHACO Left 05/15/2015   Procedure: CATARACT EXTRACTION PHACO AND INTRAOCULAR LENS PLACEMENT ([place]);  Surgeon: Lyla Glassing, MD;  Location: [facility] ORS;  Service: Ophthalmology;  Laterality: Left;  [place]: 2:40.9 AP: 17.3 CDE: 27.88   CERVICAL FUSION     COLONOSCOPY WITH PROPOFOL N/A 06/14/2016   Procedure: COLONOSCOPY WITH PROPOFOL;  Surgeon: Manya Silvas, MD;   Location: [facility] ENDOSCOPY;  Service: Endoscopy;  Laterality: N/A;   COLONOSCOPY WITH PROPOFOL N/A 03/15/2022   Procedure: COLONOSCOPY WITH PROPOFOL;  Surgeon: [name], DO;  Location: [facility] ENDOSCOPY;  Service: Gastroenterology;  Laterality: N/A;   CORRECTION HAMMER TOE     ESOPHAGOGASTRODUODENOSCOPY N/A 06/11/2022   Procedure: ESOPHAGOGASTRODUODENOSCOPY (EGD);  Surgeon: [name], DO;  Location: [facility] ENDOSCOPY;  Service: Gastroenterology;  Laterality: N/A;   ESOPHAGOGASTRODUODENOSCOPY (EGD) WITH PROPOFOL N/A 03/15/2022   Procedure: ESOPHAGOGASTRODUODENOSCOPY (EGD) WITH PROPOFOL;  Surgeon: [name], DO;  Location: [place];  Service: Gastroenterology;  Laterality: N/A;   EYE SURGERY Right    cataract   FRACTURE SURGERY     HIP FRACTURE SURGERY Right    pins in hip   JOINT REPLACEMENT     KNEE ARTHROPLASTY Left    total knee replacement    FAMILY HISTORY:  We obtained a detailed, 4-generation family history.  Significant diagnoses are listed below: Family History  Problem Relation Age of Onset   Breast cancer Mother        dx 37s   Prostate cancer Father        d. 59   Colon polyps Father    Breast cancer Sister 38   Uterine cancer Sister        dx 21s   Cancer  Paternal Aunt        multiple aunts; unknown types   Prostate cancer Paternal Uncle        at least 2 uncles   Cancer Other    Diabetes Other    Arthritis Other    Mr. Fomby has 3 sisters, 1 brother. One sister had breast cancer at 52, another sister had uterine cancer in her 58s.   Mr. Rebello mother had breast cancer in her 37s. No other known cancers on this side of the family.  Mr. Karner father had prostate cancer and passed at 26. Patient had at least 2 paternal uncles who also had prostate cancer, and several aunts who had cancer, unknown types.  Mr. Kattner is unaware of previous family history of genetic testing for hereditary cancer risks. There is no reported Ashkenazi  Jewish ancestry. There is no known consanguinity.    GENETIC COUNSELING ASSESSMENT: Mr. Ofarrell is a 71 y.o. male with a personal history of colon polyps and family history of prostate cancer which is somewhat suggestive of a hereditary cancer syndrome and predisposition to cancer. We, therefore, discussed and recommended the following at today's visit.   DISCUSSION:  We discussed that polyps in general are common, however, most people have fewer than 5 lifetime polyps.  When an individual has 10 or more polyps we become concerned about an underlying polyposis syndrome.  The most common hereditary polyposis syndromes are caused by problems in the APC and MUTYH genes. We discussed that approximately 10% of prostate cancer is hereditary. There are other genes associated with hereditary cancer as well. Cancers and risks are gene specific. We discussed that testing is beneficial for several reasons including knowing about cancer risks, identifying potential screening and risk-reduction options that may be appropriate, and to understand if other family members could be at risk for cancer and allow them to undergo genetic testing.   We reviewed the characteristics, features and inheritance patterns of hereditary cancer syndromes. We also discussed genetic testing, including the appropriate family members to test, the process of testing, insurance coverage and turn-around-time for results. We discussed the implications of a negative, positive and/or variant of uncertain significant result. We recommended Mr. Mendell pursue genetic testing for the [facility] Multi-Cancer+RNA gene panel.   Based on Mr. Capasso's personal history of colon polyps  he meets medical criteria for genetic testing. Despite that he meets criteria, he may still have an out of pocket cost. We discussed that if his out of pocket cost for testing is over $100, the laboratory will call and confirm whether he wants to proceed with testing.  If the out of  pocket cost of testing is less than $100 he will be billed by the genetic testing laboratory.   PLAN: After considering the risks, benefits, and limitations, Mr. Jeziorski provided informed consent to pursue genetic testing and the blood sample was sent to [facility] for analysis of the Multi-Cancer+RNA panel. Results should be available within approximately 2-3 weeks' time, at which point they will be disclosed by telephone to Mr. Zenia Resides, as will any additional recommendations warranted by these results. Mr. Burkett will receive a summary of his genetic counseling visit and a copy of his results once available. This information will also be available in Epic.   Mr. Seydel questions were answered to his satisfaction today. Our contact information was provided should additional questions or concerns arise. Thank you for the referral and allowing [place] to share in the care of your patient.  Faith Rogue, MS, [facility] Genetic Counselor [email or web address] Phone: [phone]  The patient was seen for a total of 25 minutes in face-to-face genetic counseling.  Dr. Grayland Ormond was available for discussion regarding this case.   _______________________________________________________________________ For Office Staff:  Number of people involved in session: 1 Was an Intern/ student involved with case: no

## 2022-08-04 ENCOUNTER — Encounter: Payer: Medicare Other | Admitting: Physical Therapy

## 2022-11-04 IMAGING — CT CT HEAD W/O CM
4 series · 17 of 47 positions shown, 19 images · non-contrast
Comparison: 11/05/2019

CLINICAL DATA: Dizziness



[Series 2: head bone · axial · 0.48mm/px · z∈[-143,-85]mm · 4 of 85 slices shown]
[im 9/85  bone]
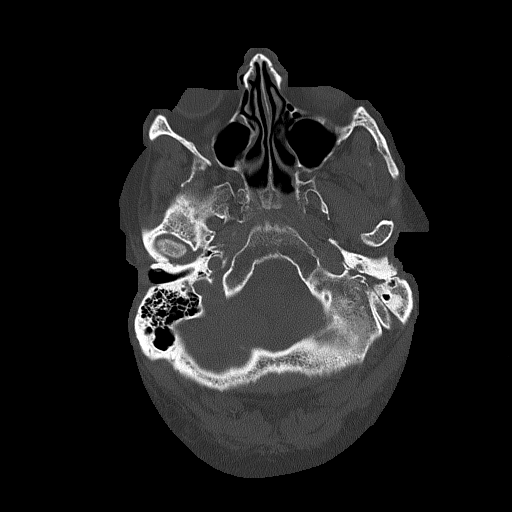
[im 17/85  bone]
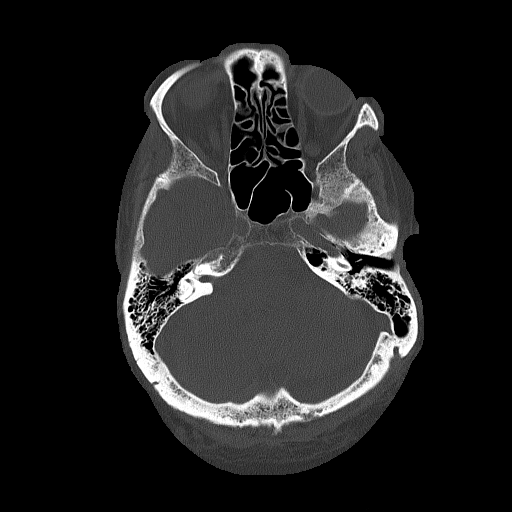
[im 26/85  bone]
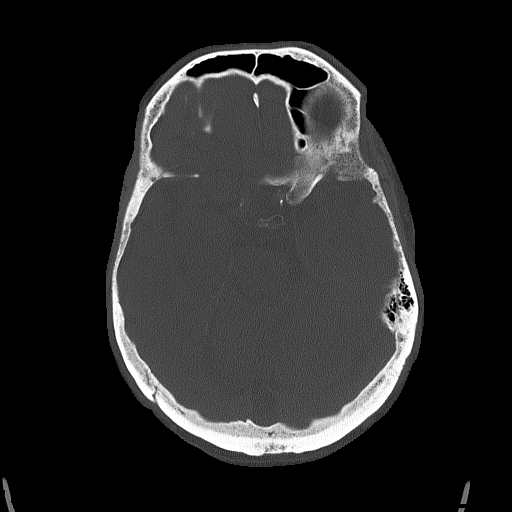
[im 38/85  bone]
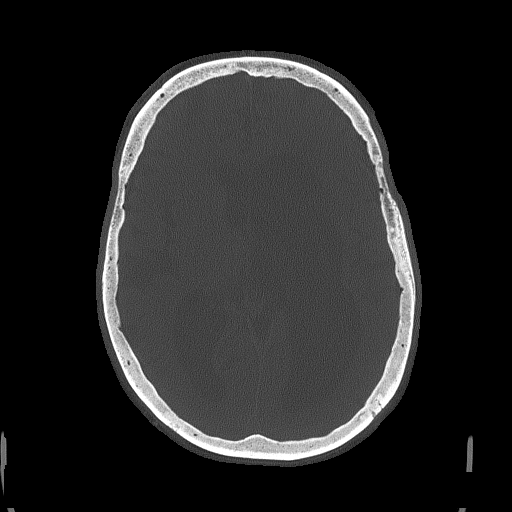

[Series 3: head wo · axial · 0.48mm/px · z∈[-139,-19]mm · 7 of 34 slices shown, 9 images]
[im 5/34  brain]
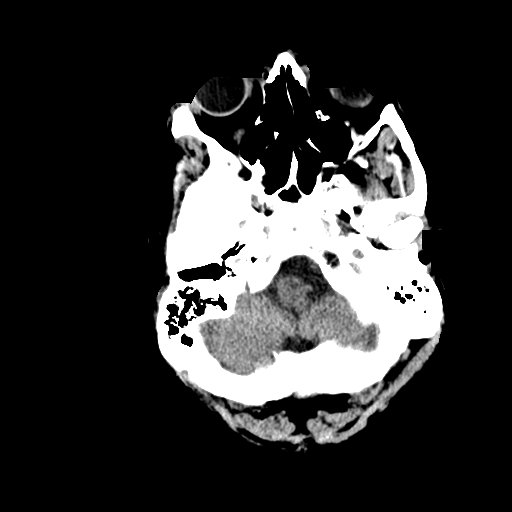
[im 5/34  bone]
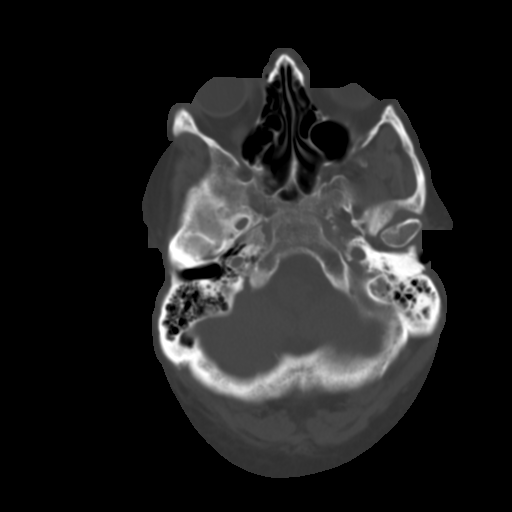
[im 9/34  brain]
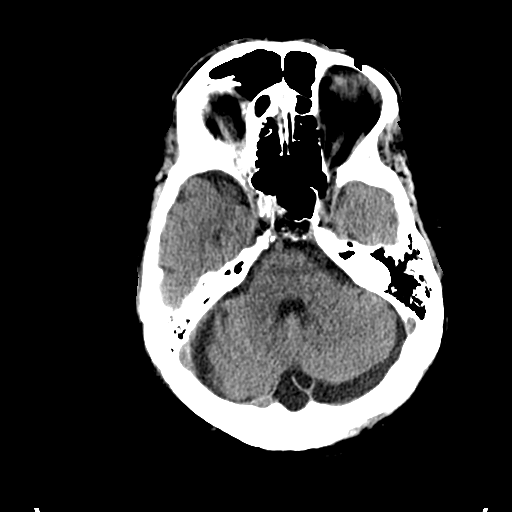
[im 13/34  brain]
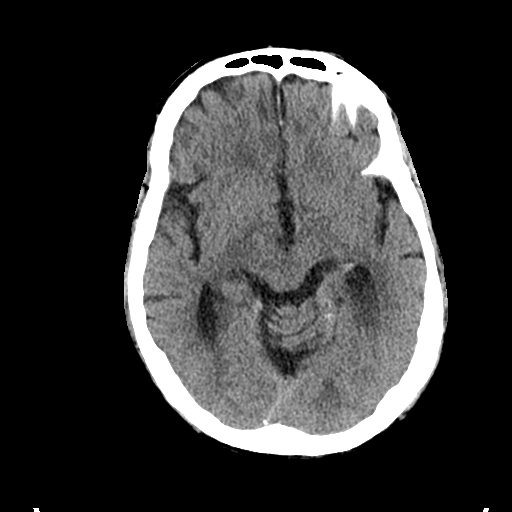
[im 17/34  brain]
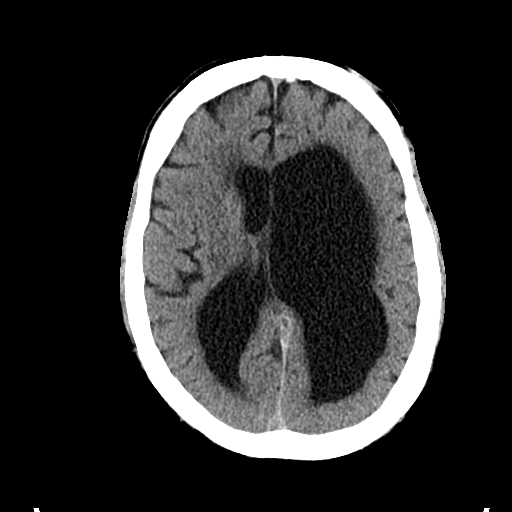
[im 21/34  brain]
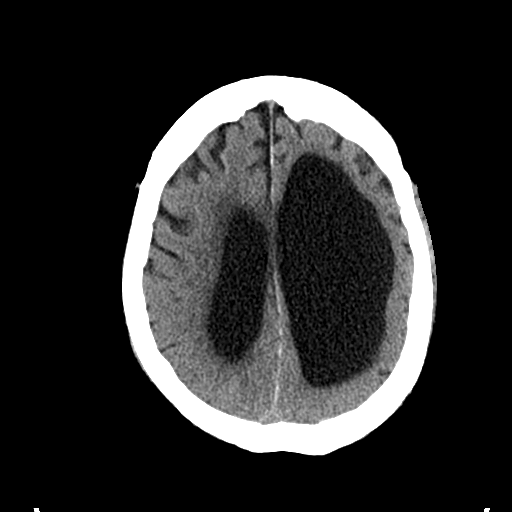
[im 21/34  bone]
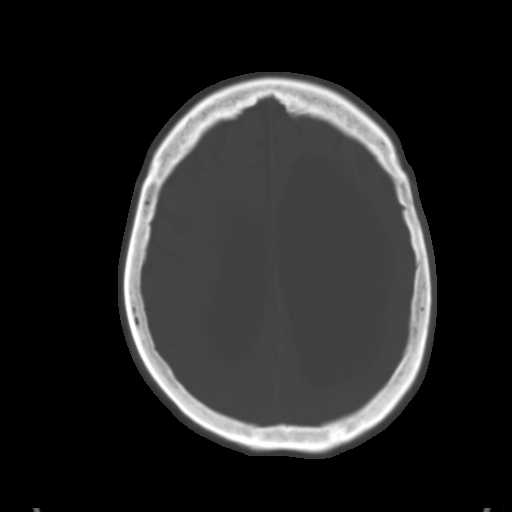
[im 25/34  brain]
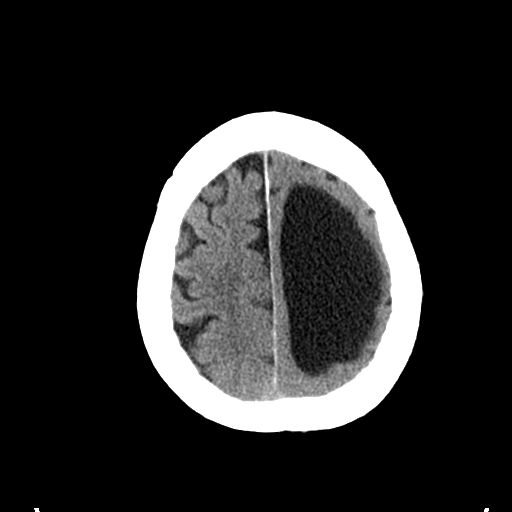
[im 29/34  brain]
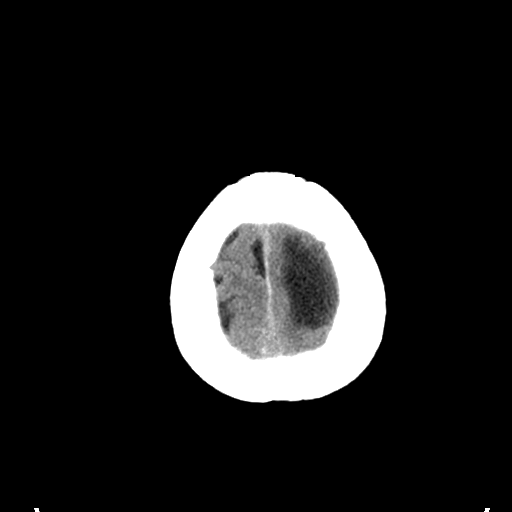

[Series 4: coronal soft tissue · coronal · 0.36mm/px · 3 of 74 slices shown]
[im 25/74  brain]
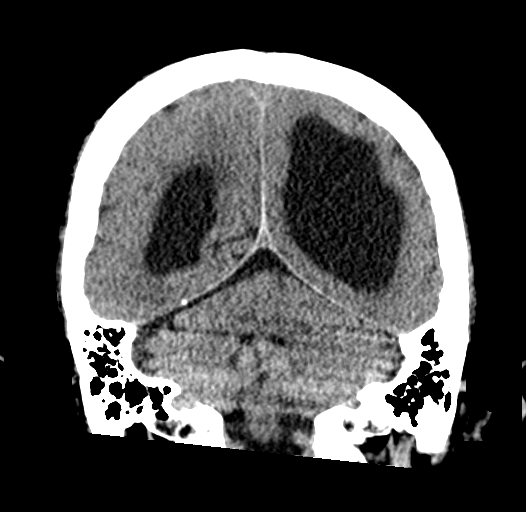
[im 33/74  brain]
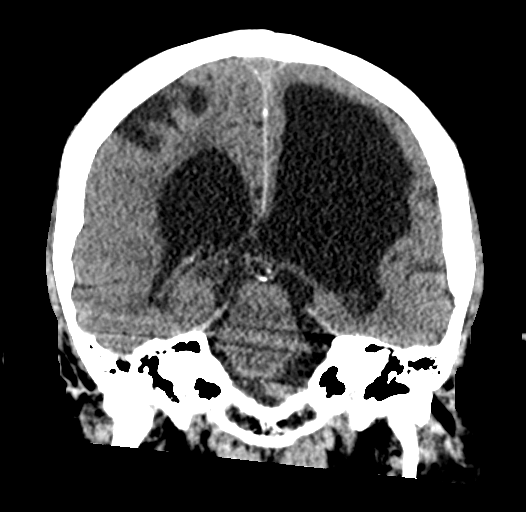
[im 41/74  brain]
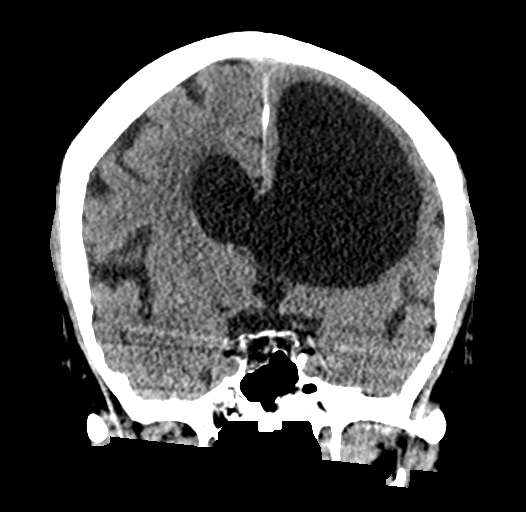

[Series 5: sagittal soft tissue · sagittal · 0.36mm/px · 3 of 64 slices shown]
[im 23/64  brain]
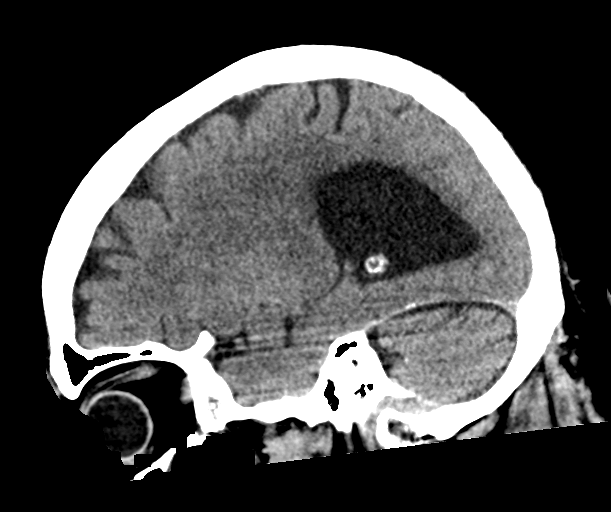
[im 32/64  brain]
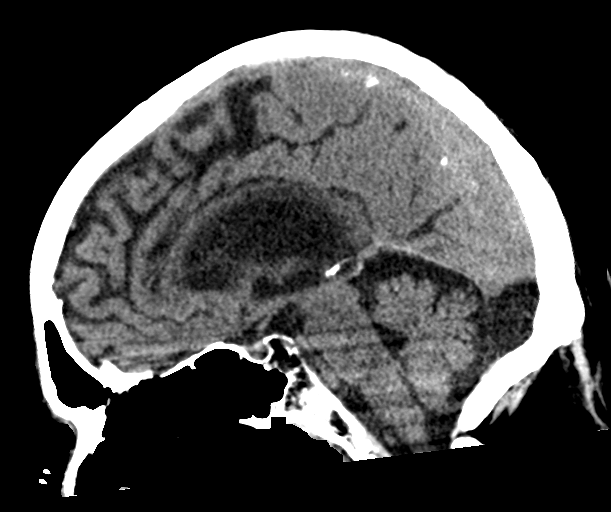
[im 42/64  brain]
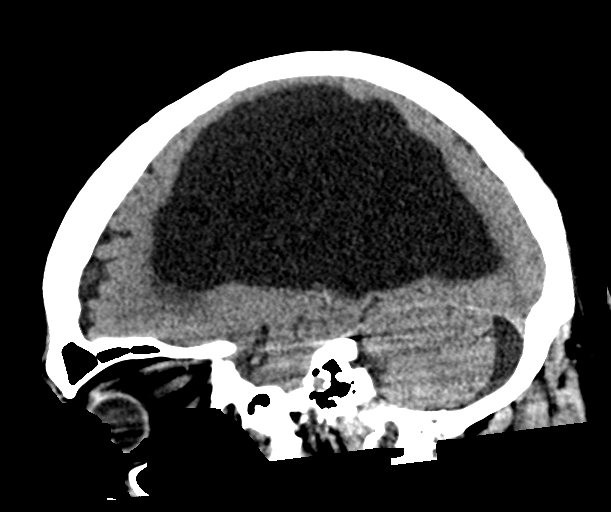

[17 of 47 positions shown; findings below may reference images not displayed]

FINDINGS: Brain: Chronic dilatation of the left lateral ventricle is noted
stable in appearance from the prior exam. Mild atrophic changes are
noted. No findings to suggest acute hemorrhage or acute infarction
are seen.

Vascular: No hyperdense vessel or unexpected calcification.

Skull: Normal. Negative for fracture or focal lesion.

Sinuses/Orbits: No acute finding.

Other: None.
IMPRESSION: Chronic changes without acute intracranial abnormality. The overall
appearance is stable from the prior study.

## 2022-11-12 ENCOUNTER — Encounter: Payer: Self-pay | Admitting: Gastroenterology

## 2022-11-14 NOTE — H&P (Signed)
Pre-Procedure H&P   Patient ID: Anthony Evans is a 71 y.o. male.  Gastroenterology Provider: [name], DO  Referring Provider: Octavia Bruckner, PA PCP: Tracie Harrier, MD  Date: 11/15/2022  HPI Anthony Evans is a 71 y.o. male who presents today for Colonoscopy for Surveillance-personal history of colon polyps . 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  Last underwent colonoscopy in April 2023 with limited prep.  This demonstrated 12 tubular adenomas and one tubulovillous adenoma as well as internal hemorrhoids.  TVA was in descending colon.  He has no family history of colon cancer.  Father with a history of colon polyps\\most recent lab work creatinine 0.9 hemoglobin 9.2 MCV 73 platelets 41,000 ferritin 117   Personal history of stroke with residual right hemiplegia.  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   Thalassemia    Thyroid disease     Past Surgical History:  Procedure Laterality Date   BACK SURGERY     screws and plate in c4 and c5   CATARACT EXTRACTION W/PHACO Left 05/15/2015   Procedure: CATARACT EXTRACTION PHACO AND INTRAOCULAR LENS PLACEMENT ([place]);  Surgeon: Lyla Glassing, MD;  Location: [facility] ORS;  Service: Ophthalmology;  Laterality: Left;  [place]: 2:40.9 AP: 17.3 CDE: 27.88   CERVICAL FUSION     COLONOSCOPY WITH PROPOFOL N/A 06/14/2016   Procedure: COLONOSCOPY WITH PROPOFOL;  Surgeon: Manya Silvas, MD;  Location: [facility] ENDOSCOPY;  Service: Endoscopy;  Laterality: N/A;   COLONOSCOPY WITH PROPOFOL N/A 03/15/2022   Procedure: COLONOSCOPY WITH  PROPOFOL;  Surgeon: [name], DO;  Location: [facility] ENDOSCOPY;  Service: Gastroenterology;  Laterality: N/A;   CORRECTION HAMMER TOE     ESOPHAGOGASTRODUODENOSCOPY N/A 06/11/2022   Procedure: ESOPHAGOGASTRODUODENOSCOPY (EGD);  Surgeon: [name], DO;  Location: [facility] ENDOSCOPY;  Service: Gastroenterology;  Laterality: N/A;   ESOPHAGOGASTRODUODENOSCOPY (EGD) WITH PROPOFOL N/A 03/15/2022   Procedure: ESOPHAGOGASTRODUODENOSCOPY (EGD) WITH PROPOFOL;  Surgeon: [name], DO;  Location: [place];  Service: Gastroenterology;  Laterality: N/A;   EYE SURGERY Right    cataract   FRACTURE SURGERY     HIP FRACTURE SURGERY Right    pins in hip   JOINT REPLACEMENT     KNEE ARTHROPLASTY Left    total knee replacement    Family History Father- polyps No h/o GI disease or malignancy  Review of Systems  Constitutional:  Negative for activity change, appetite change, chills, diaphoresis, fatigue, fever and unexpected weight change.  HENT:  Negative for trouble swallowing and voice change.   Respiratory:  Negative for shortness of breath and wheezing.   Cardiovascular:  Negative for chest pain, palpitations and leg swelling.  Gastrointestinal:  Negative for abdominal distention, abdominal pain, anal bleeding, blood in stool, constipation, diarrhea, nausea and vomiting.  Skin:  Negative for color change and pallor.  Neurological:  Negative for dizziness, syncope and weakness.  Psychiatric/Behavioral:  Negative for confusion. The patient is not nervous/anxious.   All other  systems reviewed and are negative.    Medications No current facility-administered medications on file prior to encounter.   Current Outpatient Medications on File Prior to Encounter  Medication Sig Dispense Refill   amLODipine (NORVASC) 5 MG tablet Take 5 mg by mouth daily.     Ascorbic Acid (VITAMIN C) 1000 MG tablet Take 1,000 mg by mouth daily.     Ca Carbonate-Mag Hydroxide (ROLAIDS)  550-110 MG CHEW Chew by mouth.     calcium carbonate (OSCAL) 1500 (600 Ca) MG TABS tablet Take by mouth 2 (two) times daily with a meal.     carbamazepine (TEGRETOL) 200 MG tablet Take 400 mg by mouth 3 (three) times daily.     Cyanocobalamin (VITAMIN B 12) 100 MCG LOZG Take 100 mcg by mouth daily.     fenofibrate (TRICOR) 48 MG tablet Take 48 mg by mouth daily.     folic acid (FOLVITE) 1 MG tablet Take 1 mg by mouth daily.     gabapentin (NEURONTIN) 300 MG capsule Take 300 mg by mouth 2 (two) times daily.     levothyroxine (SYNTHROID, LEVOTHROID) 175 MCG tablet Take 175 mcg by mouth daily.      meloxicam (MOBIC) 7.5 MG tablet Take 7.5 mg by mouth 2 (two) times daily.      timolol (TIMOPTIC) 0.5 % ophthalmic solution Place 1 drop into both eyes daily.  2   triamterene-hydrochlorothiazide (MAXZIDE) 75-50 MG per tablet Take 1 tablet by mouth daily.      vitamin E 400 UNIT capsule Take 400 Units by mouth daily.     amoxicillin (AMOXIL) 500 MG capsule Take 500 mg by mouth 4 (four) times daily as needed (dental).     Calcipotriene-Betameth Diprop (WYNZORA) 0.005-0.064 % CREA Apply to hands 5 nights a week at bedtime 60 g 1   cetirizine (ZYRTEC) 10 MG tablet Take 10 mg by mouth daily.     clobetasol cream (TEMOVATE) 9.48 % Apply 1 application. topically 2 (two) times daily.     cyclobenzaprine (FLEXERIL) 5 MG tablet Take 5 mg by mouth 3 (three) times daily as needed for muscle spasms.  (Patient not taking: Reported on 04/22/2022)     HYDROcodone-acetaminophen (NORCO/VICODIN) 5-325 MG tablet Take 1 tablet by mouth as needed for moderate pain. (Patient not taking: Reported on 11/15/2022)     hydrocortisone 2.5 % ointment Apply topically as needed.     hydroxychloroquine (PLAQUENIL) 200 MG tablet Take 200 mg by mouth daily.  (Patient not taking: Reported on 03/15/2022)     meclizine (ANTIVERT) 25 MG tablet Take 1 tablet (25 mg total) by mouth 3 (three) times daily as needed for dizziness. 30 tablet 0    methotrexate (RHEUMATREX) 2.5 MG tablet Take 10 mg by mouth every Wednesday.      Multiple Vitamins-Minerals (CENTRUM SILVER 50+WOMEN) TABS Take 1 tablet by mouth daily.     primidone (MYSOLINE) 250 MG tablet Take 250 mg by mouth 3 (three) times daily.      tamsulosin (FLOMAX) 0.4 MG CAPS capsule Take 1 capsule (0.4 mg total) by mouth daily. 30 capsule 0   triamcinolone cream (KENALOG) 0.1 % Apply 1 application topically See admin instructions.      Pertinent medications related to GI and procedure were reviewed by me with the patient prior to the procedure   Current Facility-Administered Medications:    0.9 %  sodium chloride infusion, , Intravenous, Continuous, [name], DO, Last Rate: 20 mL/hr at 11/15/22 0730, New Bag  at 11/15/22 0730      Allergies  Allergen Reactions   Etanercept Nausea And Vomiting   Etodolac Nausea And Vomiting   Rubbing Alcohol [Isopropyl Alcohol] Rash   Allergies were reviewed by me prior to the procedure  Objective   Body mass index is 31.71 kg/m. Vitals:   11/15/22 0707  BP: 135/89  Pulse: 92  Resp: 16  Temp: (!) 97.1 F (36.2 C)  TempSrc: Temporal  SpO2: 97%  Weight: 100.2 kg  Height: '5\' 10"'$  (1.778 m)     Physical Exam Vitals and nursing note reviewed.  Constitutional:      General: He is not in acute distress.    Appearance: Normal appearance. He is obese. He is not ill-appearing, toxic-appearing or diaphoretic.  HENT:     Head: Normocephalic and atraumatic.     Nose: Nose normal.     Mouth/Throat:     Mouth: Mucous membranes are moist.     Pharynx: Oropharynx is clear.  Eyes:     General: No scleral icterus.    Extraocular Movements: Extraocular movements intact.  Cardiovascular:     Rate and Rhythm: Normal rate and regular rhythm.     Heart sounds: Normal heart sounds. No murmur heard.    No friction rub. No gallop.  Pulmonary:     Effort: Pulmonary effort is normal. No respiratory distress.     Breath sounds:  Normal breath sounds. No wheezing, rhonchi or rales.  Abdominal:     General: Bowel sounds are normal. There is no distension.     Palpations: Abdomen is soft.     Tenderness: There is no abdominal tenderness. There is no guarding or rebound.  Musculoskeletal:     Cervical back: Neck supple.     Right lower leg: No edema.     Left lower leg: No edema.  Skin:    General: Skin is warm and dry.     Coloration: Skin is not jaundiced or pale.  Neurological:     Mental Status: He is alert and oriented to person, place, and time. Mental status is at baseline.  Psychiatric:        Mood and Affect: Mood normal.        Behavior: Behavior normal.        Thought Content: Thought content normal.        Judgment: Judgment normal.      Assessment:  Anthony Evans is a 71 y.o. male  who presents today for Colonoscopy for Surveillance-personal history of colon polyps .  Plan:  Colonoscopy with possible intervention today  Colonoscopy with possible biopsy, control of bleeding, polypectomy, and interventions as necessary has been discussed with the patient/patient representative. Informed consent was obtained from the patient/patient representative after explaining the indication, nature, and risks of the procedure including but not limited to death, bleeding, perforation, missed neoplasm/lesions, cardiorespiratory compromise, and reaction to medications. Opportunity for questions was given and appropriate answers were provided. Patient/patient representative has verbalized understanding is amenable to undergoing the procedure.   [name], DO  [facility]  Portions of the record may have been created with voice recognition software. Occasional wrong-word or 'sound-a-like' substitutions may have occurred due to the inherent limitations of voice recognition software.  Read the chart carefully and recognize, using context, where substitutions may have occurred.

## 2022-11-15 ENCOUNTER — Ambulatory Visit: Payer: Medicare Other | Admitting: Anesthesiology

## 2022-11-15 ENCOUNTER — Encounter
Admission: RE | Disposition: A | Discharge status: Home / Self Care | Payer: Self-pay | Source: Home / Self Care | Attending: Gastroenterology

## 2022-11-15 ENCOUNTER — Ambulatory Visit
Admission: RE | Admit: 2022-11-15 | Discharge: 2022-11-15 | Disposition: A | Discharge status: Home / Self Care | Payer: Medicare Other | Attending: Gastroenterology | Admitting: Gastroenterology

## 2022-11-15 ENCOUNTER — Encounter: Payer: Self-pay | Admitting: Gastroenterology

## 2022-11-15 DIAGNOSIS — E039 Hypothyroidism, unspecified: Secondary | ICD-10-CM | POA: Diagnosis not present

## 2022-11-15 DIAGNOSIS — Z86010 Personal history of colonic polyps: Secondary | ICD-10-CM | POA: Insufficient documentation

## 2022-11-15 DIAGNOSIS — D122 Benign neoplasm of ascending colon: Secondary | ICD-10-CM | POA: Diagnosis not present

## 2022-11-15 DIAGNOSIS — D123 Benign neoplasm of transverse colon: Secondary | ICD-10-CM | POA: Insufficient documentation

## 2022-11-15 DIAGNOSIS — D120 Benign neoplasm of cecum: Secondary | ICD-10-CM | POA: Diagnosis not present

## 2022-11-15 DIAGNOSIS — K640 First degree hemorrhoids: Secondary | ICD-10-CM | POA: Diagnosis not present

## 2022-11-15 DIAGNOSIS — Z8669 Personal history of other diseases of the nervous system and sense organs: Secondary | ICD-10-CM | POA: Insufficient documentation

## 2022-11-15 DIAGNOSIS — I10 Essential (primary) hypertension: Secondary | ICD-10-CM | POA: Insufficient documentation

## 2022-11-15 DIAGNOSIS — Z1211 Encounter for screening for malignant neoplasm of colon: Secondary | ICD-10-CM | POA: Diagnosis present

## 2022-11-15 DIAGNOSIS — Z96652 Presence of left artificial knee joint: Secondary | ICD-10-CM | POA: Insufficient documentation

## 2022-11-15 DIAGNOSIS — I69351 Hemiplegia and hemiparesis following cerebral infarction affecting right dominant side: Secondary | ICD-10-CM | POA: Insufficient documentation

## 2022-11-15 DIAGNOSIS — D124 Benign neoplasm of descending colon: Secondary | ICD-10-CM | POA: Diagnosis not present

## 2022-11-15 DIAGNOSIS — K219 Gastro-esophageal reflux disease without esophagitis: Secondary | ICD-10-CM | POA: Insufficient documentation

## 2022-11-15 DIAGNOSIS — L4050 Arthropathic psoriasis, unspecified: Secondary | ICD-10-CM | POA: Insufficient documentation

## 2022-11-15 DIAGNOSIS — G4730 Sleep apnea, unspecified: Secondary | ICD-10-CM | POA: Insufficient documentation

## 2022-11-15 HISTORY — PX: COLONOSCOPY: SHX5424

## 2022-11-15 SURGERY — COLONOSCOPY
Anesthesia: General

## 2022-11-15 MED ORDER — SODIUM CHLORIDE 0.9 % IV SOLN: INTRAVENOUS | Status: DC

## 2022-11-15 MED ORDER — PROPOFOL 10 MG/ML IV BOLUS
INTRAVENOUS | Status: DC | PRN
  Administered 2022-11-15: 30 mg via INTRAVENOUS
  Administered 2022-11-15: 20 mg via INTRAVENOUS

## 2022-11-15 MED ORDER — FENTANYL CITRATE (PF) 100 MCG/2ML IJ SOLN
INTRAMUSCULAR | Status: AC
  Filled 2022-11-15: qty 2

## 2022-11-15 MED ORDER — MIDAZOLAM HCL 2 MG/2ML IJ SOLN
INTRAMUSCULAR | Status: DC | PRN
  Administered 2022-11-15 (×2): 1 mg via INTRAVENOUS

## 2022-11-15 MED ORDER — PROPOFOL 500 MG/50ML IV EMUL
INTRAVENOUS | Status: DC | PRN
  Administered 2022-11-15: 75 ug/kg/min via INTRAVENOUS

## 2022-11-15 MED ORDER — FENTANYL CITRATE (PF) 100 MCG/2ML IJ SOLN
INTRAMUSCULAR | Status: DC | PRN
  Administered 2022-11-15 (×4): 25 ug via INTRAVENOUS

## 2022-11-15 MED ORDER — PROPOFOL 10 MG/ML IV BOLUS
INTRAVENOUS | Status: AC
  Filled 2022-11-15: qty 40

## 2022-11-15 MED ORDER — MIDAZOLAM HCL 2 MG/2ML IJ SOLN
INTRAMUSCULAR | Status: AC
  Filled 2022-11-15: qty 2

## 2022-11-15 NOTE — Op Note (Signed)
[facility] Patient Name: Anthony Evans Procedure Date: 11/15/2022 7:16 AM MRN: [id number] Account #: [id number] Date of Birth: 07/31/1951 Admit Type: Outpatient Age: 71 Room: [facility] ENDO ROOM 2 Gender: Male Note Status: Finalized Instrument Name: Colonoscope [id number] Procedure:             Colonoscopy Indications:           High risk colon cancer surveillance: Personal history                         of colonic polyps Providers:             [name] DO, DO Medicines:             Monitored Anesthesia Care Complications:         No immediate complications. Estimated blood loss:                         Minimal. Procedure:             Pre-Anesthesia Assessment:                        - Prior to the procedure, a History and Physical was                         performed, and patient medications and allergies were                         reviewed. The patient is competent. The risks and                         benefits of the procedure and the sedation options and                         risks were discussed with the patient. All questions                         were answered and informed consent was obtained.                         Patient identification and proposed procedure were                         verified by the physician, the nurse, the anesthetist                         and the technician in the endoscopy suite. Mental                         Status Examination: alert and oriented. Airway                         Examination: normal oropharyngeal airway and neck                         mobility. Respiratory Examination: clear to                         auscultation. CV Examination: RRR, no murmurs, no S3  or S4. Prophylactic Antibiotics: The patient does not                         require prophylactic antibiotics. Prior                         Anticoagulants: The patient has taken no anticoagulant                          or antiplatelet agents. ASA Grade Assessment: III - A                         patient with severe systemic disease. After reviewing                         the risks and benefits, the patient was deemed in                         satisfactory condition to undergo the procedure. The                         anesthesia plan was to use monitored anesthesia care                         (MAC). Immediately prior to administration of                         medications, the patient was re-assessed for adequacy                         to receive sedatives. The heart rate, respiratory                         rate, oxygen saturations, blood pressure, adequacy of                         pulmonary ventilation, and response to care were                         monitored throughout the procedure. The physical                         status of the patient was re-assessed after the                         procedure.                        After obtaining informed consent, the colonoscope was                         passed under direct vision. Throughout the procedure,                         the patient's blood pressure, pulse, and oxygen                         saturations were monitored continuously. The  Colonoscope was introduced through the anus and                         advanced to the the terminal ileum, with                         identification of the appendiceal orifice and IC                         valve. The colonoscopy was performed without                         difficulty. The patient tolerated the procedure well.                         The quality of the bowel preparation was evaluated                         using the BBPS [facility] Bowel Preparation Scale) with                         scores of: Right Colon = 2 (minor amount of residual                         staining, small fragments of stool and/or opaque                         liquid, but  mucosa seen well), Transverse Colon = 2                         (minor amount of residual staining, small fragments of                         stool and/or opaque liquid, but mucosa seen well) and                         Left Colon = 2 (minor amount of residual staining,                         small fragments of stool and/or opaque liquid, but                         mucosa seen well). The total BBPS score equals 6. The                         quality of the bowel preparation was good. The                         terminal ileum, ileocecal valve, appendiceal orifice,                         and rectum were photographed. Findings:      The perianal and digital rectal examinations were normal. Pertinent       negatives include normal sphincter tone.      The terminal ileum appeared normal. Estimated blood loss: none.      Retroflexion in the right colon was performed.  Three sessile polyps were found in the ascending colon. The polyps were       5 to 8 mm in size. These polyps were removed with a hot snare. Resection       and retrieval were complete. Estimated blood loss was minimal. Estimated       blood loss was minimal.      Four sessile polyps were found in the descending colon (1), ascending       colon (1) and cecum (2). The polyps were 3 to 5 mm in size. Estimated       blood loss was minimal.      Nine sessile polyps were found in the rectum (1), descending colon (2),       transverse colon (3), ascending colon (2) and cecum (1). The polyps were       1 to 2 mm in size. These polyps were removed with a jumbo cold forceps.       Resection and retrieval were complete. Estimated blood loss was minimal.      Non-bleeding internal hemorrhoids were found during retroflexion. The       hemorrhoids were Grade I (internal hemorrhoids that do not prolapse).       Estimated blood loss: none.      adherent stool to the walls. Large amounts of lavage to clear for       improved  visualization, Estimated blood loss: none.      The exam was otherwise without abnormality on direct and retroflexion       views.      A tattoo was seen in the descending colon. A post-polypectomy scar was       found at the tattoo site. Possible residual polyp vs. scar removed via       cold snare as above. Impression:            - The examined portion of the ileum was normal.                        - Three 5 to 8 mm polyps in the ascending colon,                         removed with a hot snare. Resected and retrieved.                        - Four 3 to 5 mm polyps in the descending colon, in                         the ascending colon and in the cecum.                        - Nine 1 to 2 mm polyps in the rectum, in the                         descending colon, in the transverse colon, in the                         ascending colon and in the cecum, removed with a jumbo                         cold forceps. Resected and retrieved.                        -  Non-bleeding internal hemorrhoids.                        - The examination was otherwise normal on direct and                         retroflexion views.                        - A tattoo was seen in the descending colon. A                         post-polypectomy scar was found at the tattoo site. Recommendation:        - Patient has a contact number available for                         emergencies. The signs and symptoms of potential                         delayed complications were discussed with the patient.                         Return to normal activities tomorrow. Written                         discharge instructions were provided to the patient.                        - Discharge patient to home.                        - Resume previous diet.                        - Continue present medications.                        - No aspirin, ibuprofen, naproxen, or other                         non-steroidal anti-inflammatory  drugs for 5 days after                         polyp removal.                        - Await pathology results.                        - Repeat colonoscopy in 1 year for surveillance based                         on pathology results.                        - Return to GI office as previously scheduled.                        - Recommend genetic testing given almost 30 polyps  removed within 1 year.                        - The findings and recommendations were discussed with                         the patient. Procedure Code(s):     --- Professional ---                        [phone], Colonoscopy, flexible; with removal of                         tumor(s), polyp(s), or other lesion(s) by snare                         technique                        45380, 2, Colonoscopy, flexible; with biopsy, single                         or multiple Diagnosis Code(s):     --- Professional ---                        Z86.010, Personal history of colonic polyps                        K64.0, First degree hemorrhoids                        D12.8, Benign neoplasm of rectum                        D12.4, Benign neoplasm of descending colon                        D12.3, Benign neoplasm of transverse colon (hepatic                         flexure or splenic flexure)                        D12.2, Benign neoplasm of ascending colon                        D12.0, Benign neoplasm of cecum CPT copyright 2022 [employer]. All rights reserved. The codes documented in this report are preliminary and upon coder review may  be revised to meet current compliance requirements. Attending Participation:      I personally performed the entire procedure. Volney American, DO [name] DO, DO 11/15/2022 9:17:49 AM This report has been signed electronically. Number of Addenda: 0 Note Initiated On: 11/15/2022 7:16 AM Scope Withdrawal Time: 1 hour 9 minutes 28 seconds  Total  Procedure Duration: 1 hour 16 minutes 10 seconds  Estimated Blood Loss:  Estimated blood loss was minimal.      [facility]

## 2022-11-15 NOTE — Transfer of Care (Signed)
Immediate Anesthesia Transfer of Care Note  Patient: BRAEDEN KENNAN  Procedure(s) Performed: COLONOSCOPY  Patient Location: PACU  Anesthesia Type:General  Level of Consciousness: awake, alert , and oriented  Airway & Oxygen Therapy: Patient Spontanous Breathing and Patient connected to nasal cannula oxygen  Post-op Assessment: Report given to RN, Post -op Vital signs reviewed and stable, and Patient moving all extremities  Post vital signs: Reviewed and stable   Last Vitals:  Vitals Value Taken Time  BP    Temp    Pulse 83 11/15/22 0908  Resp 11 11/15/22 0908  SpO2 100 % 11/15/22 0908  Vitals shown include unvalidated device data.  Last Pain:  Vitals:   11/15/22 0707  TempSrc: Temporal         Complications: No notable events documented.

## 2022-11-15 NOTE — Anesthesia Preprocedure Evaluation (Signed)
Anesthesia Evaluation  Patient identified by MRN, date of birth, ID band Patient awake    Reviewed: Allergy & Precautions, NPO status , Patient's Chart, lab work & pertinent test results  History of Anesthesia Complications Negative for: history of anesthetic complications  Airway Mallampati: III  TM Distance: <3 FB Neck ROM: full    Dental  (+) Chipped   Pulmonary neg shortness of breath, sleep apnea    Pulmonary exam normal        Cardiovascular Exercise Tolerance: Good hypertension, (-) angina (-) Past MI Normal cardiovascular exam     Neuro/Psych Seizures -,  CVA  negative psych ROS   GI/Hepatic Neg liver ROS,GERD  Controlled,,  Endo/Other  Hypothyroidism    Renal/GU negative Renal ROS  negative genitourinary   Musculoskeletal   Abdominal   Peds  Hematology negative hematology ROS (+)   Anesthesia Other Findings Past Medical History: No date: Allergy No date: Arthritis     Comment:  psoriatic No date: Back pain No date: Cataract No date: Degenerative disc disease No date: Diverticulitis No date: GERD (gastroesophageal reflux disease) No date: Hypertension No date: Hypothyroidism No date: Psoriasis No date: Reflux No date: Seizures ([place])     Comment:  last seizure about 10 years ago No date: 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 No date: Thalassemia No date: Thyroid disease  Past Surgical History: No date: BACK SURGERY     Comment:  screws and plate in c4 and c5 12/11/1094: CATARACT EXTRACTION W/PHACO; Left     Comment:  Procedure: CATARACT EXTRACTION PHACO AND INTRAOCULAR               LENS PLACEMENT (IOC);  Surgeon: Lyla Glassing, MD;                Location: [facility] ORS;  Service: Ophthalmology;  Laterality:              Left;  [place]: 2:40.9 AP: 17.3 CDE: 27.88 No date: CERVICAL FUSION 06/14/2016: COLONOSCOPY WITH PROPOFOL; N/A     Comment:  Procedure: COLONOSCOPY WITH PROPOFOL;   Surgeon: Manya Silvas, MD;  Location: [facility] ENDOSCOPY;  Service:               Endoscopy;  Laterality: N/A; 03/15/2022: COLONOSCOPY WITH PROPOFOL; N/A     Comment:  Procedure: COLONOSCOPY WITH PROPOFOL;  Surgeon: [name], DO;  Location: [facility] ENDOSCOPY;  Service:               Gastroenterology;  Laterality: N/A; No date: CORRECTION HAMMER TOE 06/11/2022: ESOPHAGOGASTRODUODENOSCOPY; N/A     Comment:  Procedure: ESOPHAGOGASTRODUODENOSCOPY (EGD);  Surgeon:               [name], DO;  Location: [facility] ENDOSCOPY;                Service: Gastroenterology;  Laterality: N/A; 03/15/2022: ESOPHAGOGASTRODUODENOSCOPY (EGD) WITH PROPOFOL; N/A     Comment:  Procedure: ESOPHAGOGASTRODUODENOSCOPY (EGD) WITH               PROPOFOL;  Surgeon: [name], DO;  Location:              [facility] ENDOSCOPY;  Service: Gastroenterology;  Laterality:               N/A; No  date: EYE SURGERY; Right     Comment:  cataract No date: FRACTURE SURGERY No date: HIP FRACTURE SURGERY; Right     Comment:  pins in hip No date: JOINT REPLACEMENT No date: KNEE ARTHROPLASTY; Left     Comment:  total knee replacement  BMI    Body Mass Index: 31.71 kg/m      Reproductive/Obstetrics negative OB ROS                             Anesthesia Physical Anesthesia Plan  ASA: 3  Anesthesia Plan: General   Post-op Pain Management:    Induction: Intravenous  PONV Risk Score and Plan: Propofol infusion and TIVA  Airway Management Planned: Natural Airway and Nasal Cannula  Additional Equipment:   Intra-op Plan:   Post-operative Plan:   Informed Consent: I have reviewed the patients History and Physical, chart, labs and discussed the procedure including the risks, benefits and alternatives for the proposed anesthesia with the patient or authorized representative who has indicated his/her understanding and acceptance.     Dental  Advisory Given  Plan Discussed with: Anesthesiologist, CRNA and Surgeon  Anesthesia Plan Comments: (Patient consented for risks of anesthesia including but not limited to:  - adverse reactions to medications - risk of airway placement if required - damage to eyes, teeth, lips or other oral mucosa - nerve damage due to positioning  - sore throat or hoarseness - Damage to heart, brain, nerves, lungs, other parts of body or loss of life  Patient voiced understanding.)       Anesthesia Quick Evaluation

## 2022-11-15 NOTE — Anesthesia Postprocedure Evaluation (Signed)
Anesthesia Post Note  Patient: Anthony Evans  Procedure(s) Performed: COLONOSCOPY  Patient location during evaluation: Endoscopy Anesthesia Type: General Level of consciousness: awake and alert Pain management: pain level controlled Vital Signs Assessment: post-procedure vital signs reviewed and stable Respiratory status: spontaneous breathing, nonlabored ventilation, respiratory function stable and patient connected to nasal cannula oxygen Cardiovascular status: blood pressure returned to baseline and stable Postop Assessment: no apparent nausea or vomiting Anesthetic complications: no   No notable events documented.   Last Vitals:  Vitals:   11/15/22 0918 11/15/22 0928  BP: (!) 152/95 (!) 153/99  Pulse: 85 82  Resp: 13 12  Temp:    SpO2: 99% 100%    Last Pain:  Vitals:   11/15/22 0928  TempSrc:   PainSc: 0-No pain                 Precious Haws Addiel Mccardle

## 2022-11-15 NOTE — Interval H&P Note (Signed)
History and Physical Interval Note: Preprocedure H&P from 11/15/22  was reviewed and there was no interval change after seeing and examining the patient.  Written consent was obtained from the patient after discussion of risks, benefits, and alternatives. Patient has consented to proceed with Colonoscopy with possible intervention   11/15/2022 7:37 AM  Anthony Evans  has presented today for surgery, with the diagnosis of Hx of adenomatous colonic polyps (Z86.010).  The various methods of treatment have been discussed with the patient and family. After consideration of risks, benefits and other options for treatment, the patient has consented to  Procedure(s): COLONOSCOPY (N/A) as a surgical intervention.  The patient's history has been reviewed, patient examined, no change in status, stable for surgery.  I have reviewed the patient's chart and labs.  Questions were answered to the patient's satisfaction.     [name]

## 2022-11-16 ENCOUNTER — Encounter: Payer: Self-pay | Admitting: Gastroenterology

## 2022-11-16 LAB — SURGICAL PATHOLOGY

## 2023-06-22 ENCOUNTER — Ambulatory Visit: Payer: Medicare Other | Admitting: Podiatry

## 2023-06-22 ENCOUNTER — Encounter: Payer: Self-pay | Admitting: Podiatry

## 2023-06-22 DIAGNOSIS — B351 Tinea unguium: Secondary | ICD-10-CM

## 2023-06-22 DIAGNOSIS — M79676 Pain in unspecified toe(s): Secondary | ICD-10-CM | POA: Diagnosis not present

## 2023-06-22 DIAGNOSIS — L409 Psoriasis, unspecified: Secondary | ICD-10-CM | POA: Insufficient documentation

## 2023-06-22 DIAGNOSIS — H25019 Cortical age-related cataract, unspecified eye: Secondary | ICD-10-CM | POA: Insufficient documentation

## 2023-06-22 DIAGNOSIS — I639 Cerebral infarction, unspecified: Secondary | ICD-10-CM | POA: Insufficient documentation

## 2023-06-22 NOTE — Progress Notes (Signed)
Subjective:  Patient ID: Anthony Evans, male    DOB: [birth year],  MRN: [id number] HPI Chief Complaint  Patient presents with   Nail Problem    Hallux bilateral - thick, discolored nails x several months,    New Patient (Initial Visit)    Est pt 2016    72 y.o. male presents with the above complaint.   ROS: Denies fever chills nausea vomit muscle aches pains calf pain back pain chest pain shortness of breath.  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 ENDOSCOPY;  Service: Gastroenterology;  Laterality: N/A;   COLONOSCOPY WITH PROPOFOL N/A 06/14/2016   Procedure: COLONOSCOPY WITH PROPOFOL;  Surgeon: Scot Jun, MD;  Location: [facility] ENDOSCOPY;  Service: Endoscopy;  Laterality: N/A;   COLONOSCOPY WITH PROPOFOL N/A 03/15/2022   Procedure: COLONOSCOPY WITH PROPOFOL;  Surgeon: Jaynie Collins, DO;  Location: [facility] ENDOSCOPY;  Service: Gastroenterology;  Laterality: N/A;   CORRECTION HAMMER TOE     ESOPHAGOGASTRODUODENOSCOPY N/A 06/11/2022   Procedure: ESOPHAGOGASTRODUODENOSCOPY (EGD);  Surgeon: Jaynie Collins, DO;  Location: [facility] ENDOSCOPY;  Service:  Gastroenterology;  Laterality: N/A;   ESOPHAGOGASTRODUODENOSCOPY (EGD) WITH PROPOFOL N/A 03/15/2022   Procedure: ESOPHAGOGASTRODUODENOSCOPY (EGD) WITH PROPOFOL;  Surgeon: Jaynie Collins, DO;  Location: [facility] ENDOSCOPY;  Service: Gastroenterology;  Laterality: N/A;   EYE SURGERY Right    cataract   FRACTURE SURGERY     HIP FRACTURE SURGERY Right    pins in hip   JOINT REPLACEMENT     KNEE ARTHROPLASTY Left    total knee replacement    Current Outpatient Medications:    amLODipine (NORVASC) 5 MG tablet, Take 5 mg by mouth daily., Disp: , Rfl:    amoxicillin (AMOXIL) 500 MG capsule, Take 500 mg by mouth 4 (four) times daily as needed (dental)., Disp: , Rfl:    Ascorbic Acid (VITAMIN C) 1000 MG tablet, Take 1,000 mg by mouth daily., Disp: , Rfl:    Ca Carbonate-Mag Hydroxide (ROLAIDS) 550-110 MG CHEW, Chew by mouth., Disp: , Rfl:    Calcipotriene-Betameth Diprop (WYNZORA) 0.005-0.064 % CREA, Apply to hands 5 nights a week at bedtime, Disp: 60 g, Rfl: 1   calcium carbonate (OSCAL) 1500 (600 Ca) MG TABS tablet, Take by mouth 2 (two) times daily with a meal., Disp: , Rfl:    carbamazepine (TEGRETOL) 200 MG tablet, Take 400 mg by mouth 3 (three) times daily., Disp: , Rfl:    cetirizine (ZYRTEC) 10 MG tablet, Take 10 mg by mouth daily.,  Disp: , Rfl:    clobetasol cream (TEMOVATE) 0.05 %, Apply 1 application. topically 2 (two) times daily., Disp: , Rfl:    Cyanocobalamin (VITAMIN B 12) 100 MCG LOZG, Take 100 mcg by mouth daily., Disp: , Rfl:    cyclobenzaprine (FLEXERIL) 5 MG tablet, Take 5 mg by mouth 3 (three) times daily as needed for muscle spasms.  (Patient not taking: Reported on 04/22/2022), Disp: , Rfl:    fenofibrate (TRICOR) 48 MG tablet, Take 48 mg by mouth daily., Disp: , Rfl:    folic acid (FOLVITE) 1 MG tablet, Take 1 mg by mouth daily., Disp: , Rfl:    gabapentin (NEURONTIN) 300 MG capsule, Take 300 mg by mouth 2 (two) times daily., Disp: , Rfl:    HYDROcodone-acetaminophen  (NORCO/VICODIN) 5-325 MG tablet, Take 1 tablet by mouth as needed for moderate pain. (Patient not taking: Reported on 11/15/2022), Disp: , Rfl:    hydrocortisone 2.5 % ointment, Apply topically as needed., Disp: , Rfl:    hydroxychloroquine (PLAQUENIL) 200 MG tablet, Take 200 mg by mouth daily.  (Patient not taking: Reported on 03/15/2022), Disp: , Rfl:    levothyroxine (SYNTHROID, LEVOTHROID) 175 MCG tablet, Take 175 mcg by mouth daily. , Disp: , Rfl:    meclizine (ANTIVERT) 25 MG tablet, Take 1 tablet (25 mg total) by mouth 3 (three) times daily as needed for dizziness., Disp: 30 tablet, Rfl: 0   meloxicam (MOBIC) 7.5 MG tablet, Take 7.5 mg by mouth 2 (two) times daily. , Disp: , Rfl:    methotrexate (RHEUMATREX) 2.5 MG tablet, Take 10 mg by mouth every Wednesday. , Disp: , Rfl:    Multiple Vitamins-Minerals (CENTRUM SILVER 50+WOMEN) TABS, Take 1 tablet by mouth daily., Disp: , Rfl:    primidone (MYSOLINE) 250 MG tablet, Take 250 mg by mouth 3 (three) times daily. , Disp: , Rfl:    tamsulosin (FLOMAX) 0.4 MG CAPS capsule, Take 1 capsule (0.4 mg total) by mouth daily., Disp: 30 capsule, Rfl: 0   timolol (TIMOPTIC) 0.5 % ophthalmic solution, Place 1 drop into both eyes daily., Disp: , Rfl: 2   triamcinolone cream (KENALOG) 0.1 %, Apply 1 application topically See admin instructions., Disp: , Rfl:    triamterene-hydrochlorothiazide (MAXZIDE) 75-50 MG per tablet, Take 1 tablet by mouth daily. , Disp: , Rfl:    vitamin E 400 UNIT capsule, Take 400 Units by mouth daily., Disp: , Rfl:   Allergies  Allergen Reactions   Etanercept Nausea And Vomiting   Etodolac Nausea And Vomiting   Rubbing Alcohol [Isopropyl Alcohol] Rash   Review of Systems Objective:  There were no vitals filed for this visit.  General: Well developed, nourished, in no acute distress, alert and oriented x3   Dermatological: Skin is warm, dry and supple bilateral. Nails particularly the hallux bilaterally demonstrate thick  yellow dystrophic clinically mycotic nail with subungual debris and overlapping growth patterns.  Otherwise the remainder of the nails are normal in appearance; remaining integument appears unremarkable at this time. There are no open sores, no preulcerative lesions, no rash or signs of infection present.  Vascular: Dorsalis Pedis artery and Posterior Tibial artery pedal pulses are 2/4 bilateral with immedate capillary fill time. Pedal hair growth present. No varicosities and no lower extremity edema present bilateral.   Neruologic: Grossly intact via light touch bilateral. Vibratory intact via tuning fork bilateral. Protective threshold with Semmes Wienstein monofilament intact to all pedal sites bilateral. Patellar and Achilles deep tendon reflexes 2+ bilateral. No Babinski  or clonus noted bilateral.   Musculoskeletal: No gross boney pedal deformities bilateral. No pain, crepitus, or limitation noted with foot and ankle range of motion bilateral. Muscular strength 5/5 in all groups tested bilateral.  Gait: Unassisted, Nonantalgic.    Radiographs:  None taken  Assessment & Plan:   Assessment: Painful mycotic nails hallux bilateral  Plan: Debrided all of his nails 1 through 5 bilateral.     Tishana Clinkenbeard T. [place], [place]

## 2023-07-26 ENCOUNTER — Ambulatory Visit: Payer: Medicare Other | Attending: Otolaryngology

## 2023-07-26 DIAGNOSIS — R400 Somnolence: Secondary | ICD-10-CM | POA: Insufficient documentation

## 2023-07-26 DIAGNOSIS — R5383 Other fatigue: Secondary | ICD-10-CM | POA: Diagnosis not present

## 2023-07-26 DIAGNOSIS — R5381 Other malaise: Secondary | ICD-10-CM | POA: Insufficient documentation

## 2023-07-26 DIAGNOSIS — R0683 Snoring: Secondary | ICD-10-CM | POA: Insufficient documentation

## 2023-09-22 ENCOUNTER — Encounter: Payer: Self-pay | Admitting: Podiatry

## 2023-09-22 ENCOUNTER — Ambulatory Visit: Payer: Medicare Other | Admitting: Podiatry

## 2023-09-22 DIAGNOSIS — B351 Tinea unguium: Secondary | ICD-10-CM

## 2023-09-22 DIAGNOSIS — M79676 Pain in unspecified toe(s): Secondary | ICD-10-CM | POA: Diagnosis not present

## 2023-09-22 DIAGNOSIS — L03031 Cellulitis of right toe: Secondary | ICD-10-CM | POA: Diagnosis not present

## 2023-09-22 MED ORDER — DOXYCYCLINE HYCLATE 100 MG PO CAPS: 100.0000 mg | ORAL_CAPSULE | Freq: Two times a day (BID) | ORAL | 0 refills | Status: AC

## 2023-09-30 NOTE — Progress Notes (Signed)
  Subjective:  Patient ID: Anthony Evans, male    DOB: 03-Apr-1951,  MRN: [id number]  72 y.o. male presents to clinic with  painful thick toenails that are difficult to trim. Pain interferes with ambulation. Aggravating factors include wearing enclosed shoe gear. Pain is relieved with periodic professional debridement.    New problem(s): None   PCP is Hande, Vishwanath, MD.  Allergies  Allergen Reactions   Etanercept Nausea And Vomiting   Etodolac Nausea And Vomiting   Rubbing Alcohol [Isopropyl Alcohol] Rash    Review of Systems: Negative except as noted in the HPI.   Objective:  [name] is a pleasant 72 y.o. male obese in NAD. AAO x 3.  Vascular Examination: Vascular status intact b/l with palpable pedal pulses. CFT immediate b/l. No edema. No pain with calf compression b/l. Skin temperature gradient WNL b/l. No cyanosis or clubbing noted b/l LE.  Neurological Examination: Sensation grossly intact b/l with 10 gram monofilament. Vibratory sensation intact b/l.   Dermatological Examination: Pedal skin with normal turgor, texture and tone b/l. No hyperkeratotic lesions noted b/l.   Toenails bilateral great toes elongated, discolored, dystrophic, thickened, and crumbly with subungual debris and tenderness to dorsal palpation.  Patient was unaware there is noted onchyolysis of nailplate of right great toe.  The nailbed(s) remain(s) intact. There is noted erythema at proximal nail fold. No purulence. There is subungual debris with mild odor. No underlying nail bed ulceration.  Musculoskeletal Examination: Muscle strength 5/5 to b/l LE. No pain, crepitus or joint limitation noted with ROM bilateral LE. No gross bony deformities bilaterally.  Radiographs: None  Last A1c:       No data to display          Assessment:   1. Pain due to onychomycosis of toenail   2. Cellulitis of great toe of right foot    Plan:  -Consent given for treatment as described  below: -Examined patient. -Patient to continue soft, supportive shoe gear daily. -Toenails bilateral great toes debrided in length and girth without iatrogenic bleeding with sterile nail nipper and dremel.  --Rx for Doxycyline 100 mg, #20, to be taken one capsule twice daily for 10 days. -Patient/POA to call should there be question/concern in the interim. -Return in about 2 weeks (around 10/06/2023) with Dr. Allena Katz for recheck of right great toe. Follow up with me in 3 months  Freddie Breech, DPM

## 2023-10-06 ENCOUNTER — Encounter: Payer: Self-pay | Admitting: Podiatry

## 2023-10-06 ENCOUNTER — Ambulatory Visit: Payer: Medicare Other | Admitting: Podiatry

## 2023-10-06 VITALS — Ht 70.0 in | Wt 221.0 lb

## 2023-10-06 DIAGNOSIS — L03031 Cellulitis of right toe: Secondary | ICD-10-CM | POA: Diagnosis not present

## 2023-10-06 NOTE — Progress Notes (Signed)
Subjective:  Patient ID: Anthony Evans, male    DOB: 10-Apr-1951,  MRN: [id number]  Chief Complaint  Patient presents with   Foot Pain    cellulitis right foot great toe     72 y.o. male presents with the above complaint.  Patient presents with right hallux paronychia.  Patient states that it came out of nowhere is progressive gotten worse patient was treated by Dr. Donzetta Matters and placed on antibiotics she is here for follow-up for further management if needed.  No ingrowing noted.  She is not a diabetic.  Pain scale is now 3 out of 10.   Review of Systems: Negative except as noted in the HPI. Denies N/V/F/Ch.  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   Thalassemia    Thalassemia, beta (HCC)    Thyroid disease     Current Outpatient Medications:    amLODipine (NORVASC) 5 MG tablet, Take 5 mg by mouth daily., Disp: , Rfl:    amoxicillin (AMOXIL) 500 MG capsule, Take 500 mg by mouth 4 (four) times daily as needed (dental)., Disp: , Rfl:    Ascorbic Acid (VITAMIN C) 1000 MG tablet, Take 1,000 mg by mouth daily., Disp: , Rfl:    Ca Carbonate-Mag Hydroxide (ROLAIDS) 550-110 MG CHEW, Chew by mouth., Disp: , Rfl:    Calcipotriene-Betameth Diprop (WYNZORA) 0.005-0.064 % CREA, Apply to hands 5 nights a week at bedtime, Disp: 60 g, Rfl: 1   calcium carbonate (OSCAL) 1500 (600 Ca) MG TABS tablet, Take by mouth 2 (two)  times daily with a meal., Disp: , Rfl:    carbamazepine (TEGRETOL) 200 MG tablet, Take 400 mg by mouth 3 (three) times daily., Disp: , Rfl:    cetirizine (ZYRTEC) 10 MG tablet, Take 10 mg by mouth daily., Disp: , Rfl:    clobetasol cream (TEMOVATE) 0.05 %, Apply 1 application. topically 2 (two) times daily., Disp: , Rfl:    Cyanocobalamin (VITAMIN B 12) 100 MCG LOZG, Take 100 mcg by mouth daily., Disp: , Rfl:    fenofibrate (TRICOR) 48 MG tablet, Take 48 mg by mouth daily., Disp: , Rfl:    folic acid (FOLVITE) 1 MG tablet, Take 1 mg by mouth daily., Disp: , Rfl:    gabapentin (NEURONTIN) 300 MG capsule, Take 300 mg by mouth 2 (two) times daily., Disp: , Rfl:    HYDROcodone-acetaminophen (NORCO/VICODIN) 5-325 MG tablet, Take 1 tablet by mouth as needed for moderate pain (pain score 4-6)., Disp: , Rfl:    hydrocortisone 2.5 % ointment, Apply topically as needed., Disp: , Rfl:    hydroxychloroquine (PLAQUENIL) 200 MG tablet, Take 200 mg by mouth daily., Disp: , Rfl:    levothyroxine (SYNTHROID, LEVOTHROID) 175 MCG tablet, Take 175 mcg by mouth daily. ,  Disp: , Rfl:    meclizine (ANTIVERT) 25 MG tablet, Take 1 tablet (25 mg total) by mouth 3 (three) times daily as needed for dizziness., Disp: 30 tablet, Rfl: 0   meloxicam (MOBIC) 7.5 MG tablet, Take 7.5 mg by mouth 2 (two) times daily. , Disp: , Rfl:    methotrexate (RHEUMATREX) 2.5 MG tablet, Take 10 mg by mouth every Wednesday. , Disp: , Rfl:    Multiple Vitamins-Minerals (CENTRUM SILVER 50+WOMEN) TABS, Take 1 tablet by mouth daily., Disp: , Rfl:    primidone (MYSOLINE) 250 MG tablet, Take 250 mg by mouth 3 (three) times daily. , Disp: , Rfl:    tamsulosin (FLOMAX) 0.4 MG CAPS capsule, Take 1 capsule (0.4 mg total) by mouth daily., Disp: 30 capsule, Rfl: 0   timolol (TIMOPTIC) 0.5 % ophthalmic solution, Place 1 drop into both eyes daily., Disp: , Rfl: 2   triamcinolone cream (KENALOG) 0.1 %, Apply 1 application topically See admin instructions.,  Disp: , Rfl:    triamterene-hydrochlorothiazide (MAXZIDE) 75-50 MG per tablet, Take 1 tablet by mouth daily. , Disp: , Rfl:    vitamin E 400 UNIT capsule, Take 400 Units by mouth daily., Disp: , Rfl:    aspirin EC 81 MG tablet, Take 81 mg by mouth daily. Swallow whole., Disp: , Rfl:    docusate sodium (COLACE) 100 MG capsule, Take 100 mg by mouth daily., Disp: , Rfl:    Methotrexate, Anti-Rheumatic, (RHEUMATREX PO), Take 2.5 mg by mouth every 7 (seven) days. TAKE 7 TABLETS EVERY 7 DAYS, Disp: , Rfl:   Social History   Tobacco Use  Smoking Status Never  Smokeless Tobacco Never    Allergies  Allergen Reactions   Etanercept Nausea And Vomiting   Etodolac Nausea And Vomiting   Rubbing Alcohol [Isopropyl Alcohol] Rash   Objective:  There were no vitals filed for this visit. Body mass index is 31.71 kg/m. Constitutional Well developed. Well nourished.  Vascular Dorsalis pedis pulses palpable bilaterally. Posterior tibial pulses palpable bilaterally. Capillary refill normal to all digits.  No cyanosis or clubbing noted. Pedal hair growth normal.  Neurologic Normal speech. Oriented to person, place, and time. Epicritic sensation to light touch grossly present bilaterally.  Dermatologic Right hallux paronychia now resolved.  No open wounds or lesion noted no cellulitis noted no erythema noted no malodor or drainage noted  Orthopedic: Normal joint ROM without pain or crepitus bilaterally. No visible deformities. No bony tenderness.   Radiographs: None Assessment:   1. Cellulitis of great toe of right foot    Plan:  Patient was evaluated and treated and all questions answered.  Right hallux paronychia resolved -All questions and concerns were discussed with the patient in extensive detail given the amount of paronychia that was present at this time I do not see any signs of ingrowing.  If it continues to regress or continues to happen we will discuss further treatment options for  now patient can finish his antibiotics he does not any further.  No follow-ups on file.

## 2023-10-18 ENCOUNTER — Encounter: Payer: Self-pay | Admitting: Gastroenterology

## 2023-11-07 ENCOUNTER — Ambulatory Visit: Admission: RE | Admit: 2023-11-07 | Payer: Medicare Other | Source: Home / Self Care | Admitting: Gastroenterology

## 2023-11-07 HISTORY — DX: Hemiplegia and hemiparesis following cerebral infarction affecting right dominant side: I69.351

## 2023-11-07 HISTORY — DX: Personal history of adenomatous and serrated colon polyps: Z86.0101

## 2023-11-07 HISTORY — DX: Difficulty in walking, not elsewhere classified: R26.2

## 2023-11-07 HISTORY — DX: Beta thalassemia: D56.1

## 2023-11-07 HISTORY — DX: Radiculopathy, cervical region: M54.12

## 2023-11-07 HISTORY — DX: Arthropathic psoriasis, unspecified: L40.50

## 2023-11-07 HISTORY — DX: Unspecified osteoarthritis, unspecified site: M19.90

## 2023-11-07 HISTORY — DX: Pure hyperglyceridemia: E78.1

## 2023-11-07 HISTORY — DX: Repeated falls: R29.6

## 2023-11-07 SURGERY — COLONOSCOPY
Anesthesia: General

## 2023-12-29 ENCOUNTER — Ambulatory Visit: Payer: Medicare Other | Admitting: Podiatry

## 2024-02-03 ENCOUNTER — Encounter: Payer: Self-pay | Admitting: Podiatry

## 2024-02-03 ENCOUNTER — Ambulatory Visit: Payer: Medicare Other | Admitting: Podiatry

## 2024-02-03 DIAGNOSIS — M79676 Pain in unspecified toe(s): Secondary | ICD-10-CM | POA: Diagnosis not present

## 2024-02-03 DIAGNOSIS — B351 Tinea unguium: Secondary | ICD-10-CM | POA: Diagnosis not present

## 2024-02-03 NOTE — Progress Notes (Signed)
  Subjective:  Patient ID: Anthony Evans, male    DOB: 12/05/1951,  MRN: [id number]  73 y.o. male presents to clinic with  painful thick toenails that are difficult to trim. Pain interferes with ambulation. Aggravating factors include wearing enclosed shoe gear. Pain is relieved with periodic professional debridement.  Chief Complaint  Patient presents with   Nail Problem    "Trim my toenails."   New problem(s): None   PCP is Hande, Vishwanath, MD.  Allergies  Allergen Reactions   Etanercept Nausea And Vomiting   Etodolac Nausea And Vomiting   Rubbing Alcohol [Isopropyl Alcohol] Rash    Review of Systems: Negative except as noted in the HPI.   Objective:  Anthony Evans is a pleasant 73 y.o. male obese in NAD. AAO x 3.  Vascular Examination: Vascular status intact b/l with palpable pedal pulses. CFT immediate b/l. No edema. No pain with calf compression b/l. Skin temperature gradient WNL b/l.   Neurological Examination: Sensation grossly intact b/l with 10 gram monofilament. Vibratory sensation intact b/l.   Dermatological Examination: Pedal skin with normal turgor, texture and tone b/l. Toenails 1-5 left, 1, 2, 4, 5 right  thick, discolored, elongated with subungual debris and pain on dorsal palpation. No hyperkeratotic lesions noted b/l. Anonychia noted R 3rd toe. Nailbed(s) epithelialized.   Musculoskeletal Examination: Muscle strength 5/5 to b/l LE. No pain, crepitus or joint limitation noted with ROM bilateral LE. No gross bony deformities bilaterally.  Radiographs: None  Last A1c:       No data to display           Assessment:   1. Pain due to onychomycosis of toenail    Plan:  Patient was evaluated and treated. All patient's and/or POA's questions/concerns addressed on today's visit. Toenails bilateral great toes, 2-5 left foot, R 2nd toe, R 4th toe, and R 5th toe debrided in length and girth without incident. Continue soft, supportive shoe gear daily.  Report any pedal injuries to medical professional. Call office if there are any questions/concerns. -Patient/POA to call should there be question/concern in the interim.  Return in about 3 months (around 05/02/2024).  Freddie Breech, DPM      [place] LOCATION: [address] [place] [address]                   Office [phone]   [facility] LOCATION: [address] [place], [place] [address] Office [phone]

## 2024-05-10 ENCOUNTER — Ambulatory Visit: Payer: Medicare Other | Admitting: Podiatry

## 2024-05-10 DIAGNOSIS — B351 Tinea unguium: Secondary | ICD-10-CM | POA: Diagnosis not present

## 2024-05-10 DIAGNOSIS — M79676 Pain in unspecified toe(s): Secondary | ICD-10-CM

## 2024-05-10 DIAGNOSIS — B353 Tinea pedis: Secondary | ICD-10-CM | POA: Diagnosis not present

## 2024-05-10 MED ORDER — KETOCONAZOLE 2 % EX CREA: TOPICAL_CREAM | CUTANEOUS | 1 refills | Status: DC

## 2024-05-10 NOTE — Progress Notes (Signed)
  Subjective:  Patient ID: Anthony Evans, male    DOB: 02/08/1951,  MRN: [id number]  73 y.o. male presents painful elongated mycotic toenails 1-5 bilaterally which are tender when wearing enclosed shoe gear. Pain is relieved with periodic professional debridement.  New problem(s): None   PCP is Antonio Baumgarten, MD , and last visit was Apr 18, 2024.  Allergies  Allergen Reactions   Etanercept Nausea And Vomiting   Etodolac Nausea And Vomiting   Rubbing Alcohol [Isopropyl Alcohol] Rash    Review of Systems: Negative except as noted in the HPI.   Objective:  Anthony Evans is a pleasant 73 y.o. male obese in NAD. AAO x 3.  Vascular Examination: Vascular status intact b/l with palpable pedal pulses. CFT immediate b/l. Pedal hair present. No edema. No pain with calf compression b/l. Skin temperature gradient WNL b/l. No varicosities noted. No cyanosis or clubbing noted.  Neurological Examination: Sensation grossly intact b/l with 10 gram monofilament. Vibratory sensation intact b/l.  Dermatological Examination: Pedal skin with normal turgor and tone b/l. No open wounds nor interdigital macerations noted. Toenails 1-5 b/l thick, discolored, elongated with subungual debris and pain on dorsal palpation. No hyperkeratotic lesions noted b/l.   Diffuse scaling noted peripherally and plantarly b/l feet.  No interdigital macerations.  No blisters, no weeping. No signs of secondary bacterial infection noted.  Musculoskeletal Examination: Muscle strength 5/5 to b/l LE.  No pain, crepitus noted b/l. No gross pedal deformities. Patient ambulates independently without assistive aids.   Radiographs: None  Last A1c:       No data to display           Assessment:   1. Pain due to onychomycosis of toenail   2. Tinea pedis of both feet   Plan:   Meds ordered this encounter  Medications   ketoconazole  (NIZORAL ) 2 % cream    Sig: Apply to both feet and between toes once daily for 6  weeks.    Dispense:  60 g    Refill:  1   -Consent given for treatment as described below: -Examined patient. -Patient to continue soft, supportive shoe gear daily. -Toenails 1-5 bilaterally were debrided in length and girth with sterile nail nippers and dremel. Pinpoint bleeding of left great toe addressed with Lumicain Hemostatic Solution, cleansed with alcohol. Triple antibiotic ointment applied. No further treatment required by patient/caregiver. -Discussed tinea pedis infection. To prevent re-infection of tinea pedis, patient/POA/caregiver instructed to spray shoes with Lysol every evening and clean tub/shower with bleach based cleanser. Rx sent to pharmacy for Ketoconazole  Cream 2% to be applied to both feet and between toes once daily for six week.  -Patient/POA to call should there be question/concern in the interim.  Return in about 3 months (around 08/10/2024).  Anthony Evans, DPM      [place] LOCATION: [address], [place] [address]                   Office [phone]   [facility] LOCATION: [address] [place], [place] [address] Office [phone]

## 2024-05-10 NOTE — Patient Instructions (Signed)
 To prevent reinfection, spray shoes with lysol every evening.  Clean tub or shower with bleach based cleanser.  Athlete's Foot Athlete's foot (tinea pedis) is a fungal infection of the skin on your feet. It often occurs on the skin that is between or underneath the toes. It can also occur on the soles of your feet. The infection can spread from person to person (is contagious). It can also spread when a person's bare feet come in contact with the fungus on shower floors or on items such as shoes. What are the causes? This condition is caused by a fungus that grows in warm, moist places. You can get athlete's foot by sharing shoes, shower stalls, towels, and wet floors with someone who is infected. Not washing your feet or changing your socks often enough can also lead to athlete's foot. What increases the risk? This condition is more likely to develop in: Men. People who have a weak body defense system (immune system). People who have diabetes. People who use public showers, such as at a gym. People who wear heavy-duty shoes, such as industrial or military shoes. Seasons with warm, humid weather. What are the signs or symptoms? Symptoms of this condition include: Itchy areas between your toes or on the soles of your feet. White, flaky, or scaly areas between your toes or on the soles of your feet. Very itchy small blisters between your toes or on the soles of your feet. Small cuts in your skin. These cuts can become infected. Thick or discolored toenails. How is this diagnosed? This condition may be diagnosed with a physical exam and a review of your medical history. Your health care provider may also take a skin or toenail sample to examine under a microscope. How is this treated? This condition is treated with antifungal medicines. These may be applied as powders, ointments, or creams. In severe cases, an oral antifungal medicine may be given. Follow these instructions at  home: Medicines Apply or take over-the-counter and prescription medicines only as told by your health care provider. Apply your antifungal medicine as told by your health care provider. Do not stop using the antifungal even if your condition improves. Foot care Do not scratch your feet. Keep your feet dry: Wear cotton or wool socks. Change your socks every day or if they become wet. Wear shoes that allow air to flow, such as sandals or canvas tennis shoes. Wash and dry your feet, including the area between your toes. Also, wash and dry your feet: Every day or as told by your health care provider. After exercising. General instructions Do not let others use towels, shoes, nail clippers, or other personal items that touch your feet. Protect your feet by wearing sandals in wet areas, such as locker rooms and shared showers. Keep all follow-up visits. This is important. If you have diabetes, keep your blood sugar under control. Contact a health care provider if: You have a fever. You have swelling, soreness, warmth, or redness in your foot. Your feet are not getting better with treatment. Your symptoms get worse. You have new symptoms. You have severe pain. Summary Athlete's foot (tinea pedis) is a fungal infection of the skin on your feet. It often occurs on skin that is between or underneath the toes. This condition is caused by a fungus that grows in warm, moist places. Symptoms include white, flaky, or scaly areas between your toes or on the soles of your feet. This condition is treated with antifungal medicines.   Keep your feet clean. Always dry them thoroughly. This information is not intended to replace advice given to you by your health care provider. Make sure you discuss any questions you have with your health care provider. Document Revised: 03/15/2021 Document Reviewed: 03/15/2021 Elsevier Patient Education  2024 [employer]  

## 2024-05-17 ENCOUNTER — Encounter: Payer: Self-pay | Admitting: Podiatry

## 2024-07-10 ENCOUNTER — Emergency Department

## 2024-07-10 ENCOUNTER — Observation Stay
Admission: EM | Admit: 2024-07-10 | Discharge: 2024-07-13 | Disposition: A | Discharge status: Skilled Nursing Facility | Attending: Family Medicine | Admitting: Family Medicine

## 2024-07-10 ENCOUNTER — Other Ambulatory Visit: Payer: Self-pay

## 2024-07-10 DIAGNOSIS — E039 Hypothyroidism, unspecified: Secondary | ICD-10-CM | POA: Diagnosis present

## 2024-07-10 DIAGNOSIS — D519 Vitamin B12 deficiency anemia, unspecified: Secondary | ICD-10-CM | POA: Insufficient documentation

## 2024-07-10 DIAGNOSIS — M51369 Other intervertebral disc degeneration, lumbar region without mention of lumbar back pain or lower extremity pain: Secondary | ICD-10-CM | POA: Diagnosis not present

## 2024-07-10 DIAGNOSIS — E785 Hyperlipidemia, unspecified: Secondary | ICD-10-CM | POA: Diagnosis not present

## 2024-07-10 DIAGNOSIS — E876 Hypokalemia: Secondary | ICD-10-CM | POA: Insufficient documentation

## 2024-07-10 DIAGNOSIS — N179 Acute kidney failure, unspecified: Secondary | ICD-10-CM | POA: Insufficient documentation

## 2024-07-10 DIAGNOSIS — R531 Weakness: Principal | ICD-10-CM

## 2024-07-10 DIAGNOSIS — I10 Essential (primary) hypertension: Secondary | ICD-10-CM | POA: Insufficient documentation

## 2024-07-10 DIAGNOSIS — D569 Thalassemia, unspecified: Secondary | ICD-10-CM | POA: Diagnosis not present

## 2024-07-10 DIAGNOSIS — Z7982 Long term (current) use of aspirin: Secondary | ICD-10-CM | POA: Insufficient documentation

## 2024-07-10 DIAGNOSIS — D509 Iron deficiency anemia, unspecified: Secondary | ICD-10-CM | POA: Diagnosis not present

## 2024-07-10 DIAGNOSIS — R269 Unspecified abnormalities of gait and mobility: Principal | ICD-10-CM | POA: Insufficient documentation

## 2024-07-10 DIAGNOSIS — R270 Ataxia, unspecified: Secondary | ICD-10-CM | POA: Diagnosis not present

## 2024-07-10 DIAGNOSIS — G40909 Epilepsy, unspecified, not intractable, without status epilepticus: Secondary | ICD-10-CM | POA: Insufficient documentation

## 2024-07-10 DIAGNOSIS — R2681 Unsteadiness on feet: Secondary | ICD-10-CM | POA: Diagnosis present

## 2024-07-10 DIAGNOSIS — Z79899 Other long term (current) drug therapy: Secondary | ICD-10-CM | POA: Insufficient documentation

## 2024-07-10 DIAGNOSIS — R55 Syncope and collapse: Secondary | ICD-10-CM | POA: Diagnosis not present

## 2024-07-10 DIAGNOSIS — R42 Dizziness and giddiness: Secondary | ICD-10-CM

## 2024-07-10 DIAGNOSIS — Z96652 Presence of left artificial knee joint: Secondary | ICD-10-CM | POA: Insufficient documentation

## 2024-07-10 DIAGNOSIS — I69351 Hemiplegia and hemiparesis following cerebral infarction affecting right dominant side: Secondary | ICD-10-CM

## 2024-07-10 LAB — DIFFERENTIAL
Abs Immature Granulocytes: 0.03 K/uL (ref 0.00–0.07)
Basophils Absolute: 0 K/uL (ref 0.0–0.1)
Basophils Relative: 0 %
Eosinophils Absolute: 0.1 K/uL (ref 0.0–0.5)
Eosinophils Relative: 2 %
Immature Granulocytes: 0 %
Lymphocytes Relative: 12 %
Lymphs Abs: 0.8 K/uL (ref 0.7–4.0)
Monocytes Absolute: 0.6 K/uL (ref 0.1–1.0)
Monocytes Relative: 9 %
Neutro Abs: 5.2 K/uL (ref 1.7–7.7)
Neutrophils Relative %: 77 %
Smear Review: NORMAL
nRBC: 0 /100{WBCs}

## 2024-07-10 LAB — COMPREHENSIVE METABOLIC PANEL WITH GFR
ALT: 18 U/L (ref 0–44)
AST: 28 U/L (ref 15–41)
Albumin: 4 g/dL (ref 3.5–5.0)
Alkaline Phosphatase: 57 U/L (ref 38–126)
Anion gap: 12 (ref 5–15)
BUN: 30 mg/dL — ABNORMAL HIGH (ref 8–23)
CO2: 31 mmol/L (ref 22–32)
Calcium: 10.6 mg/dL — ABNORMAL HIGH (ref 8.9–10.3)
Chloride: 98 mmol/L (ref 98–111)
Creatinine, Ser: 1.4 mg/dL — ABNORMAL HIGH (ref 0.61–1.24)
GFR, Estimated: 54 mL/min — ABNORMAL LOW (ref >60)
Glucose, Bld: 136 mg/dL — ABNORMAL HIGH (ref 70–99)
Potassium: 3.7 mmol/L (ref 3.5–5.1)
Sodium: 141 mmol/L (ref 135–145)
Total Bilirubin: 0.5 mg/dL (ref 0.0–1.2)
Total Protein: 7.2 g/dL (ref 6.5–8.1)

## 2024-07-10 LAB — URINALYSIS, ROUTINE W REFLEX MICROSCOPIC
Bilirubin Urine: NEGATIVE
Glucose, UA: NEGATIVE mg/dL
Hgb urine dipstick: NEGATIVE
Ketones, ur: NEGATIVE mg/dL
Leukocytes,Ua: NEGATIVE
Nitrite: NEGATIVE
Protein, ur: NEGATIVE mg/dL
Specific Gravity, Urine: 1.015 (ref 1.005–1.030)
pH: 6 (ref 5.0–8.0)

## 2024-07-10 LAB — CBC
HCT: 29.3 % — ABNORMAL LOW (ref 39.0–52.0)
Hemoglobin: 9 g/dL — ABNORMAL LOW (ref 13.0–17.0)
MCH: 21.9 pg — ABNORMAL LOW (ref 26.0–34.0)
MCHC: 30.7 g/dL (ref 30.0–36.0)
MCV: 71.3 fL — ABNORMAL LOW (ref 80.0–100.0)
Platelets: 289 K/uL (ref 150–400)
RBC: 4.11 MIL/uL — ABNORMAL LOW (ref 4.22–5.81)
RDW: 21.2 % — ABNORMAL HIGH (ref 11.5–15.5)
WBC: 6.8 K/uL (ref 4.0–10.5)
nRBC: 0 % (ref 0.0–0.2)

## 2024-07-10 LAB — APTT: aPTT: 31 s (ref 24–36)

## 2024-07-10 LAB — ETHANOL: Alcohol, Ethyl (B): <15 mg/dL (ref <15)

## 2024-07-10 LAB — TROPONIN I (HIGH SENSITIVITY): Troponin I (High Sensitivity): 14 ng/L (ref <18)

## 2024-07-10 LAB — PROTIME-INR
INR: 1.1 (ref 0.8–1.2)
Prothrombin Time: 14.5 s (ref 11.4–15.2)

## 2024-07-10 MED ORDER — ACETAMINOPHEN 325 MG PO TABS
650.0000 mg | ORAL_TABLET | Freq: Once | ORAL | Status: AC | PRN
  Administered 2024-07-10: 650 mg via ORAL
  Filled 2024-07-10: qty 2

## 2024-07-10 MED ORDER — MECLIZINE HCL 25 MG PO TABS
25.0000 mg | ORAL_TABLET | Freq: Once | ORAL | Status: AC
  Administered 2024-07-10: 25 mg via ORAL
  Filled 2024-07-10: qty 1

## 2024-07-10 MED ORDER — SODIUM CHLORIDE 0.9 % IV BOLUS
1000.0000 mL | Freq: Once | INTRAVENOUS | Status: AC
  Administered 2024-07-10: 1000 mL via INTRAVENOUS

## 2024-07-10 NOTE — ED Notes (Signed)
Pt taken to ct via w/c with ct tech

## 2024-07-10 NOTE — ED Notes (Signed)
 This tech assisted pt to restroom in triage.

## 2024-07-10 NOTE — ED Triage Notes (Signed)
 Pt was picked up from [facility], pt has been having epsiodes of weakness and was scheduled to see neuro today at 1500, pt was unable to walk to his car without assistance and 911 was called on his behalf  145/71, 112p, 95%

## 2024-07-10 NOTE — ED Provider Notes (Signed)
 [facility] Provider Note    Event Date/Time   First MD Initiated Contact with Patient 07/10/24 1630     (approximate)   History   Weakness  Pt was picked up from [facility], pt has been having epsiodes of weakness and was scheduled to see neuro today at 1500, pt was unable to walk to his car without assistance and 911 was called on his behalf  145/71, 112p, 95%  See First RN note.  Pt c/o increasing weakness in L arm and L leg x2-3 weeks.  Denies pain.  Pt reports Hx of prenatal stroke w/ deficit in R hand.       HPI Anthony Evans is a 73 y.o. 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  Notes he feels somewhat dizzy during these episodes.  1 occurred yesterday after he walked out to the mailbox, return to the house, then slowly slumped down to the ground.  Did not lose consciousness, no head strike.  Lives alone.  Is accompanied by his older sister today who provides collateral.  Notes an ambulance was called to help him up and patient did not come to the hospital. -Patient was at a restaurant earlier today when he had an episode of dizziness and felt unable to walk.  Denies frank vertigo though otherwise cannot describe the dizziness.  Bystanders helped him into his car and called an ambulance. -No recent infectious symptoms -Has vaguely been feeling that his left arm may be slightly weaker than usual over the past 2-3 months -No black or bloody stools, no urinary symptoms     Physical Exam   Triage Vital Signs: ED Triage Vitals [07/10/24 1425]  Encounter Vitals Group     BP 135/73     Girls Systolic BP Percentile      Girls Diastolic BP Percentile      Boys Systolic BP Percentile      Boys Diastolic BP Percentile      Pulse Rate (!) 109     Resp 18      Temp 98.3 F (36.8 C)     Temp Source Oral     SpO2 93 %     Weight 225 lb (102.1 kg)     Height 5' 10 (1.778 m)     Head Circumference      Peak Flow      Pain Score 0     Pain Loc      Pain Education      Exclude from Growth Chart     Most recent vital signs: Vitals:   07/10/24 1930 07/10/24 1943  BP: (!) 142/80   Pulse: 89   Resp: 15   Temp:  98.6 F (37 C)  SpO2: 94%      General: Awake, no distress.  CV:  Good peripheral perfusion. RRR, RP 2+ Resp:  Normal effort. CTAB Abd:  No distention. Nontender to deep palpation throughout Neuro:  Aox4, CN II-XII intact, mild dysmetria with right upper extremity, grip 5/5 left upper extremity, 4/5 right upper extremity, EHL/FHL. BUE AG 10+ sec no drift, BLE AG 5+ sec no drift.  Ambulates with unsteady gait. SILT.     ED Results / Procedures / Treatments   Labs (all labs ordered are listed, but only abnormal results are displayed) Labs Reviewed  CBC - Abnormal; Notable for the following components:      Result Value   RBC 4.11 (*)    Hemoglobin 9.0 (*)    HCT 29.3 (*)    MCV 71.3 (*)    MCH 21.9 (*)    RDW 21.2 (*)    All other components within normal limits  COMPREHENSIVE METABOLIC PANEL WITH GFR - Abnormal; Notable for the following components:   Glucose, Bld 136 (*)    BUN 30 (*)    Creatinine, Ser 1.40 (*)    Calcium 10.6 (*)    GFR, Estimated 54 (*)    All other components within normal limits  URINALYSIS, ROUTINE W REFLEX MICROSCOPIC - Abnormal; Notable for the following components:   Color, Urine YELLOW (*)    APPearance HAZY (*)    All other components within normal limits  PROTIME-INR  APTT  DIFFERENTIAL  ETHANOL  TROPONIN I (HIGH SENSITIVITY)     EKG  Ecg = sinus rhythm, rate 93, no gross ST elevation or depression, no significant repolarization abnormality, left axis deviation, first-degree AV block.  No clear evidence of ischemia nor arrhythmia on my interpretation.   RADIOLOGY Radiology  interpreted myself and radiology report reviewed.  No acute pathology identified on CT head.    PROCEDURES:  Critical Care performed: No  Procedures   MEDICATIONS ORDERED IN ED: Medications  sodium chloride  0.9 % bolus 1,000 mL (0 mLs Intravenous Stopped 07/10/24 2117)  meclizine  (ANTIVERT ) tablet 25 mg (25 mg Oral Given 07/10/24 1748)  acetaminophen  (TYLENOL ) tablet 650 mg (650 mg Oral Given 07/10/24 2121)     IMPRESSION / MDM / ASSESSMENT AND PLAN / ED COURSE  I reviewed the triage vital signs and the nursing notes.                              DDX/MDM/AP: Differential diagnosis includes, but is not limited to, CVA though no clear new focal deficit on exam other than being unsteady on his feet, consider underlying electrolyte abnormality or anemia, UTI, consider transient arrhythmia.   Plan: - Labs  -EKG - CT head - IV fluid, meclizine  - Reassess  Patient's presentation is most consistent with acute presentation with potential threat to life or bodily function.  The patient is on the cardiac monitor to evaluate for evidence of arrhythmia and/or significant heart rate changes.  ED course below.  Workup notable for AKI, otherwise unremarkable.  CT head with no acute pathology.  Patient does have ongoing dizziness when he attempts to walk and is unsteady on his feet compared to his baseline.  Patient and family do not feel comfortable going home.  Suspect some degree of dehydration contributing to presentation though symptoms are persistent after 1 L IV fluid and meclizine .  Will discuss with hospitalist for possible admission, can consider possibility of posterior stroke and may require placement if he does not improve.  Clinical Course as of 07/10/24 2155  Tue Jul 10, 2024  1710 CMP with AKI, otherwise unremarkable [MM]  1710 CBC with mild drop in hemoglobin to 9.0 today, appears baseline ranges 10-11, was 11.4 about 3 months ago on outpatient labs  No leukocytosis [MM]  1712  CTH: IMPRESSION: 1. No acute intracranial abnormality. 2. Pronounced left cerebral and moderate left cerebellar volume loss with marked chronic enlargement of the left lateral ventricle and moderately prominent right ventricle. 3. Mild-to-moderate periventricular white matter disease.   [MM]  1747  UA with no evidence of infection [MM]  2110 DRE negative, no black or bloody stools, Hemoccult negative [MM]  2113 Patient failed repeat attempted ambulation, veered to the left, required heavy assistance  Will discuss with hospitalist [MM]    Clinical Course User Index [MM] Clarine Ozell LABOR, MD     FINAL CLINICAL IMPRESSION(S) / ED DIAGNOSES   Final diagnoses:  Weakness  AKI (acute kidney injury) (HCC)  Ataxia     Rx / DC Orders   ED Discharge Orders     None        Note:  This document was prepared using Dragon voice recognition software and may include unintentional dictation errors.   Clarine Ozell LABOR, MD 07/10/24 2155

## 2024-07-10 NOTE — ED Notes (Signed)
 Pt transported via stretcher by tech over to CPOD from main side of ED. Upon arrival pt requesting to use the bathroom. Was informed in report that pt can walk via 2 person assist or with walker. Walker provided. Pt ambulated well to and from bathroom with walker and with this nurse. Pt changed into gown and connected to monitor once in bed. Shirt, pants, socks, and shoes placed in pt belongings bag. Bed in lowest position with wheels locked. Call light within reach. Side rails up. Warm blankets provided and lights turned off per pt request.

## 2024-07-10 NOTE — ED Triage Notes (Signed)
 See First RN note.  Pt c/o increasing weakness in L arm and L leg x2-3 weeks.  Denies pain.  Pt reports Hx of prenatal stroke w/ deficit in R hand.

## 2024-07-10 NOTE — ED Notes (Signed)
 Per sister, pt had an episode yesterday after lunch where pt became weak and fell at home. Denies LOC, EMS was called to help pt up to feet at that time.

## 2024-07-10 NOTE — ED Notes (Signed)
 Pt requested to speak with his sister. Phone provided per request.

## 2024-07-11 ENCOUNTER — Observation Stay

## 2024-07-11 ENCOUNTER — Other Ambulatory Visit: Payer: Self-pay

## 2024-07-11 ENCOUNTER — Observation Stay (HOSPITAL_BASED_OUTPATIENT_CLINIC_OR_DEPARTMENT_OTHER)
Admit: 2024-07-11 | Discharge: 2024-07-11 | Disposition: A | Discharge status: Home / Self Care | Attending: Internal Medicine | Admitting: Internal Medicine

## 2024-07-11 DIAGNOSIS — M5412 Radiculopathy, cervical region: Secondary | ICD-10-CM | POA: Diagnosis not present

## 2024-07-11 DIAGNOSIS — R531 Weakness: Secondary | ICD-10-CM

## 2024-07-11 DIAGNOSIS — R2681 Unsteadiness on feet: Secondary | ICD-10-CM | POA: Diagnosis not present

## 2024-07-11 DIAGNOSIS — G9520 Unspecified cord compression: Secondary | ICD-10-CM

## 2024-07-11 DIAGNOSIS — R55 Syncope and collapse: Secondary | ICD-10-CM

## 2024-07-11 DIAGNOSIS — R42 Dizziness and giddiness: Secondary | ICD-10-CM

## 2024-07-11 LAB — RAPID HIV SCREEN (HIV 1/2 AB+AG)
HIV 1/2 Antibodies: NONREACTIVE
HIV-1 P24 Antigen - HIV24: NONREACTIVE

## 2024-07-11 LAB — CBC WITH DIFFERENTIAL/PLATELET
Abs Immature Granulocytes: 0.02 K/uL (ref 0.00–0.07)
Basophils Absolute: 0 K/uL (ref 0.0–0.1)
Basophils Relative: 1 %
Eosinophils Absolute: 0.2 K/uL (ref 0.0–0.5)
Eosinophils Relative: 3 %
HCT: 29.4 % — ABNORMAL LOW (ref 39.0–52.0)
Hemoglobin: 8.9 g/dL — ABNORMAL LOW (ref 13.0–17.0)
Immature Granulocytes: 0 %
Lymphocytes Relative: 15 %
Lymphs Abs: 1.1 K/uL (ref 0.7–4.0)
MCH: 21.4 pg — ABNORMAL LOW (ref 26.0–34.0)
MCHC: 30.3 g/dL (ref 30.0–36.0)
MCV: 70.8 fL — ABNORMAL LOW (ref 80.0–100.0)
Monocytes Absolute: 0.7 K/uL (ref 0.1–1.0)
Monocytes Relative: 10 %
Neutro Abs: 5.1 K/uL (ref 1.7–7.7)
Neutrophils Relative %: 71 %
Platelets: 306 K/uL (ref 150–400)
RBC: 4.15 MIL/uL — ABNORMAL LOW (ref 4.22–5.81)
RDW: 20.8 % — ABNORMAL HIGH (ref 11.5–15.5)
WBC: 7.2 K/uL (ref 4.0–10.5)
nRBC: 0.3 % — ABNORMAL HIGH (ref 0.0–0.2)

## 2024-07-11 LAB — COMPREHENSIVE METABOLIC PANEL WITH GFR
ALT: 18 U/L (ref 0–44)
AST: 25 U/L (ref 15–41)
Albumin: 4.2 g/dL (ref 3.5–5.0)
Alkaline Phosphatase: 58 U/L (ref 38–126)
Anion gap: 10 (ref 5–15)
BUN: 25 mg/dL — ABNORMAL HIGH (ref 8–23)
CO2: 26 mmol/L (ref 22–32)
Calcium: 9.3 mg/dL (ref 8.9–10.3)
Chloride: 100 mmol/L (ref 98–111)
Creatinine, Ser: 0.99 mg/dL (ref 0.61–1.24)
GFR, Estimated: >60 mL/min (ref >60)
Glucose, Bld: 108 mg/dL — ABNORMAL HIGH (ref 70–99)
Potassium: 3.5 mmol/L (ref 3.5–5.1)
Sodium: 136 mmol/L (ref 135–145)
Total Bilirubin: 1 mg/dL (ref 0.0–1.2)
Total Protein: 7.3 g/dL (ref 6.5–8.1)

## 2024-07-11 LAB — ECHOCARDIOGRAM COMPLETE
AR max vel: 1.29 cm2
AV Area VTI: 1.69 cm2
AV Area mean vel: 1.21 cm2
AV Mean grad: 20 mmHg
AV Peak grad: 37.3 mmHg
Ao pk vel: 3.05 m/s
Area-P 1/2: 4.29 cm2
Height: 70 in
MV VTI: 2.61 cm2
S' Lateral: 2.7 cm
Weight: 3600 [oz_av]

## 2024-07-11 LAB — TSH: TSH: 0.986 u[IU]/mL (ref 0.350–4.500)

## 2024-07-11 LAB — LIPID PANEL
Cholesterol: 168 mg/dL (ref 0–200)
HDL: 39 mg/dL — ABNORMAL LOW (ref >40)
LDL Cholesterol: 99 mg/dL (ref 0–99)
Total CHOL/HDL Ratio: 4.3 ratio
Triglycerides: 149 mg/dL (ref <150)
VLDL: 30 mg/dL (ref 0–40)

## 2024-07-11 LAB — HIV ANTIBODY (ROUTINE TESTING W REFLEX): HIV Screen 4th Generation wRfx: REACTIVE — AB

## 2024-07-11 LAB — VITAMIN B12: Vitamin B-12: 266 pg/mL (ref 180–914)

## 2024-07-11 MED ORDER — GABAPENTIN 300 MG PO CAPS
600.0000 mg | ORAL_CAPSULE | Freq: Every day | ORAL | Status: DC
  Administered 2024-07-11 – 2024-07-12 (×2): 600 mg via ORAL
  Filled 2024-07-11 (×2): qty 2

## 2024-07-11 MED ORDER — TAMSULOSIN HCL 0.4 MG PO CAPS
0.4000 mg | ORAL_CAPSULE | Freq: Every day | ORAL | Status: DC
  Administered 2024-07-11 – 2024-07-13 (×3): 0.4 mg via ORAL
  Filled 2024-07-11 (×3): qty 1

## 2024-07-11 MED ORDER — ENOXAPARIN SODIUM 60 MG/0.6ML IJ SOSY
50.0000 mg | PREFILLED_SYRINGE | INTRAMUSCULAR | Status: DC
  Administered 2024-07-11 – 2024-07-13 (×3): 50 mg via SUBCUTANEOUS
  Filled 2024-07-11 (×3): qty 0.6

## 2024-07-11 MED ORDER — PRIMIDONE 250 MG PO TABS: 250.0000 mg | ORAL_TABLET | Freq: Three times a day (TID) | ORAL | Status: DC

## 2024-07-11 MED ORDER — GABAPENTIN 300 MG PO CAPS
300.0000 mg | ORAL_CAPSULE | Freq: Every morning | ORAL | Status: DC
  Administered 2024-07-11 – 2024-07-13 (×3): 300 mg via ORAL
  Filled 2024-07-11 (×3): qty 1

## 2024-07-11 MED ORDER — ACETAMINOPHEN 160 MG/5ML PO SOLN: 650.0000 mg | ORAL | Status: DC | PRN

## 2024-07-11 MED ORDER — ACETAMINOPHEN 650 MG RE SUPP: 650.0000 mg | RECTAL | Status: DC | PRN

## 2024-07-11 MED ORDER — ASPIRIN 81 MG PO TBEC
81.0000 mg | DELAYED_RELEASE_TABLET | Freq: Every day | ORAL | Status: DC
  Administered 2024-07-11 – 2024-07-13 (×3): 81 mg via ORAL
  Filled 2024-07-11 (×3): qty 1

## 2024-07-11 MED ORDER — CARBAMAZEPINE 200 MG PO TABS
400.0000 mg | ORAL_TABLET | Freq: Three times a day (TID) | ORAL | Status: DC
  Administered 2024-07-11 – 2024-07-13 (×7): 400 mg via ORAL
  Filled 2024-07-11 (×8): qty 2

## 2024-07-11 MED ORDER — LEVOTHYROXINE SODIUM 100 MCG PO TABS
175.0000 ug | ORAL_TABLET | Freq: Every day | ORAL | Status: DC
  Administered 2024-07-12 – 2024-07-13 (×2): 175 ug via ORAL
  Filled 2024-07-11 (×2): qty 2

## 2024-07-11 MED ORDER — ACETAMINOPHEN 325 MG PO TABS: 650.0000 mg | ORAL_TABLET | ORAL | Status: DC | PRN

## 2024-07-11 MED ORDER — STROKE: EARLY STAGES OF RECOVERY BOOK: Freq: Once | Status: AC

## 2024-07-11 MED ORDER — GABAPENTIN 300 MG PO CAPS: 300.0000 mg | ORAL_CAPSULE | Freq: Two times a day (BID) | ORAL | Status: DC

## 2024-07-11 MED ORDER — SODIUM CHLORIDE 0.9 % IV SOLN: INTRAVENOUS | Status: AC

## 2024-07-11 MED ORDER — HYDROCODONE-ACETAMINOPHEN 5-325 MG PO TABS: 1.0000 | ORAL_TABLET | Freq: Four times a day (QID) | ORAL | Status: DC | PRN

## 2024-07-11 MED ORDER — GABAPENTIN 300 MG PO CAPS
900.0000 mg | ORAL_CAPSULE | Freq: Every day | ORAL | Status: DC
  Administered 2024-07-11 – 2024-07-12 (×2): 900 mg via ORAL
  Filled 2024-07-11 (×2): qty 3

## 2024-07-11 NOTE — Assessment & Plan Note (Signed)
 Continue carbamazepine , gabapentin  and primidone 

## 2024-07-11 NOTE — Evaluation (Signed)
 Occupational Therapy Evaluation Patient Details Name: Anthony Evans MRN: [id number] DOB: [birth year] Today's Date: 07/11/2024   History of Present Illness   73 y.o. male with medical history significant of In utero stroke with right hemiparesis, hypertension, hypothyroidism, lumbar degenerative disc disease, BPH, remote history of seizures on multiple AEDs, followed by neurology, being admitted with a several week history of frequent falls and more recently, unsteady gait, new left-sided tingling, leaning to the left, with a presyncopal episode on the day of arrival as he was making his way to the car.  At baseline he walks with a cane.     Clinical Impressions Patient presenting with decreased Ind in self care,balance, functional mobility/transfers, endurance, and safety awareness. Patient reports living at home alone and being mod I with use of SPC for mobility and self care needs. Pt does still drive. Pt does display some general confusion and difficulty with problem solving and safety awareness during session. Orthostatic obtained this session as well without positive result. Pt having difficulty standing much longer than 3 minutes with use of RW. He declined ambulation to bathroom and needed assistance holding urinal. He urinated on floor and needing help with hygiene and clothing management. Pt is not at Mod I level at this time and only has intermittent assistance at home.  Patient will benefit from acute OT to increase overall independence in the areas of ADLs, functional mobility, and safety awareness in order to safely discharge.     If plan is discharge home, recommend the following:   A little help with walking and/or transfers;Assistance with cooking/housework;Assist for transportation;Direct supervision/assist for medications management;Direct supervision/assist for financial management;Help with stairs or ramp for entrance;Supervision due to cognitive status;A little help with  bathing/dressing/bathroom     Functional Status Assessment   Patient has had a recent decline in their functional status and demonstrates the ability to make significant improvements in function in a reasonable and predictable amount of time.     Equipment Recommendations   None recommended by OT      Precautions/Restrictions   Precautions Precautions: Fall     Mobility Bed Mobility Overal bed mobility: Needs Assistance Bed Mobility: Supine to Sit, Sit to Supine     Supine to sit: Min assist Sit to supine: Min assist        Transfers Overall transfer level: Needs assistance Equipment used: Rolling walker (2 wheels) Transfers: Sit to/from Stand, Bed to chair/wheelchair/BSC Sit to Stand: Contact guard assist, Min assist     Step pivot transfers: Contact guard assist            Balance Overall balance assessment: Needs assistance Sitting-balance support: Feet supported Sitting balance-Leahy Scale: Good     Standing balance support: Reliant on assistive device for balance, Bilateral upper extremity supported Standing balance-Leahy Scale: Fair Standing balance comment: tolerates standing for maybe ~ to get BP                           ADL either performed or assessed with clinical judgement   ADL Overall ADL's : Needs assistance/impaired                                       General ADL Comments: Pt needing assistance for clothing management, urinal placement, and LE hygiene.     Vision Patient Visual Report: No change from baseline  Pertinent Vitals/Pain Pain Assessment Pain Assessment: Faces Faces Pain Scale: Hurts little more Pain Location: chronic back pain Pain Descriptors / Indicators: Discomfort, Aching Pain Intervention(s): Monitored during session, Repositioned     Extremity/Trunk Assessment Upper Extremity Assessment Upper Extremity Assessment: Generalized weakness;Left hand  dominant;RUE deficits/detail;LUE deficits/detail RUE Deficits / Details: deficits /weakness from birth LUE Deficits / Details: Pt endorsing numbness comes and goes   Lower Extremity Assessment Lower Extremity Assessment: Generalized weakness       Communication Communication Communication: No apparent difficulties   Cognition Arousal: Alert Behavior During Therapy: WFL for tasks assessed/performed Cognition: Cognition impaired     Awareness: Intellectual awareness impaired, Online awareness impaired Memory impairment (select all impairments): Short-term memory, Working memory Attention impairment (select first level of impairment): Selective attention Executive functioning impairment (select all impairments): Sequencing, Reasoning, Problem solving OT - Cognition Comments: difficulty problem solving in room with IV and safety awareness. Pt with just generally strange line of questioning.                 Following commands: Impaired Following commands impaired: Only follows one step commands consistently, Follows one step commands with increased time     Cueing  General Comments   Cueing Techniques: Verbal cues;Gestural cues;Tactile cues              Home Living Family/patient expects to be discharged to:: Private residence Living Arrangements: Alone Available Help at Discharge: Family;Available PRN/intermittently Type of Home: House Home Access: Ramped entrance     Home Layout: One level     Bathroom Shower/Tub: Chief Strategy Officer: Handicapped height     Home Equipment: Agricultural consultant (2 wheels);Cane - quad;Cane - single point          Prior Functioning/Environment Prior Level of Function : Independent/Modified Independent;Driving               ADLs Comments: Pt endorses being Ind with self care and IADL tasks at baseline. Use of cane for mobility.    OT Problem List: Decreased strength;Decreased cognition;Decreased safety  awareness;Decreased activity tolerance;Impaired balance (sitting and/or standing)   OT Treatment/Interventions: Self-care/ADL training;Therapeutic activities;Therapeutic exercise;Energy conservation;Patient/family education;Balance training      OT Goals(Current goals can be found in the care plan section)   Acute Rehab OT Goals Patient Stated Goal: to go home and feel better OT Goal Formulation: With patient Time For Goal Achievement: 07/25/24 Potential to Achieve Goals: Fair ADL Goals Pt Will Perform Grooming: with supervision;standing Pt Will Perform Lower Body Dressing: with supervision;sit to/from stand Pt Will Transfer to Toilet: with supervision;ambulating Pt Will Perform Toileting - Clothing Manipulation and hygiene: with supervision;sit to/from stand   OT Frequency:  Min 2X/week       AM-PAC OT 6 Clicks Daily Activity     Outcome Measure Help from another person eating meals?: A Little Help from another person taking care of personal grooming?: A Little Help from another person toileting, which includes using toliet, bedpan, or urinal?: A Little Help from another person bathing (including washing, rinsing, drying)?: A Little Help from another person to put on and taking off regular upper body clothing?: A Little Help from another person to put on and taking off regular lower body clothing?: A Little 6 Click Score: 18   End of Session Equipment Utilized During Treatment: Rolling walker (2 wheels)  Activity Tolerance: Patient tolerated treatment well Patient left: in bed;with call bell/phone within reach;with bed alarm set  OT Visit Diagnosis: Unsteadiness on feet (  R26.81);Muscle weakness (generalized) (M62.81)                Time: 8840-8771 OT Time Calculation (min): 29 min Charges:  OT General Charges $OT Visit: 1 Visit OT Evaluation $OT Eval Moderate Complexity: 1 Mod OT Treatments $Self Care/Home Management : 8-22 mins Izetta Claude, MS, OTR/L ,  CBIS ascom [phone]  07/11/24, 12:40 PM

## 2024-07-11 NOTE — Evaluation (Addendum)
 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) 03/19/2021   Difficulty walking 01/14/2021   Drowsiness 01/14/2021   Falls frequently 01/14/2021   Anemia 11/08/2019   BMI 30.0-30.9,adult 11/08/2019   Hypertriglyceridemia 11/08/2019   Right leg weakness 11/08/2019   Syncope 11/05/2019   High risk medication use 01/31/2019   Hypothyroidism, acquired 11/08/2018   C2 cervical fracture (HCC) 09/14/2018   Hypertension 03/08/2018   GERD (gastroesophageal reflux disease) 03/08/2018   Thyroid disease 03/08/2018   Degenerative disc disease 03/08/2018   Psoriatic arthritis (HCC) 03/08/2018   Back pain 03/08/2018   Thalassemia minor 03/08/2018   Seizure disorder (HCC) 03/08/2018   H/O cataract 03/08/2018   Seasonal allergies 03/08/2018   History of seizure 04/23/2016   Hx of adenomatous colonic polyps 04/23/2016   Cervical radiculitis 03/13/2015   Inflammatory arthritis 10/01/2014   Past Medical History:  Past Medical History:  Diagnosis Date   Allergy    Arthritis    psoriatic   Back pain    Cataract    Cervical radiculitis    Degenerative disc disease    Difficulty walking    Diverticulitis    Falls frequently    GERD (gastroesophageal reflux disease)    Hemiplegia and hemiparesis following cerebral infarction affecting right dominant side (HCC)    Hx of adenomatous colonic polyps    Hypertension     Hypertriglyceridemia    Hypothyroidism    OA (osteoarthritis)    Psoriasis    Psoriatic arthritis (HCC)    Reflux    Seizures (HCC)    last seizure about 10 years ago   Stroke [facility])    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   Thalassemia    Thalassemia, beta (HCC)    Thyroid disease    Past Surgical History:  Past Surgical History:  Procedure Laterality Date   BACK SURGERY     screws and plate in c4 and c5   CATARACT EXTRACTION W/PHACO Left 05/15/2015   Procedure: CATARACT EXTRACTION PHACO AND INTRAOCULAR LENS PLACEMENT (IOC);  Surgeon: Newell Ovens, MD;  Location: [facility] ORS;  Service: Ophthalmology;  Laterality: Left;  US : 2:40.9 AP: 17.3 CDE: 27.88   CERVICAL FUSION     COLONOSCOPY N/A 11/15/2022   Procedure: COLONOSCOPY;  Surgeon: Onita Elspeth Sharper, DO;  Location: [facility] ENDOSCOPY;  Service: Gastroenterology;  Laterality: N/A;   COLONOSCOPY WITH PROPOFOL  N/A 06/14/2016   Procedure: COLONOSCOPY WITH PROPOFOL ;  Surgeon: Lamar ONEIDA Holmes, MD;  Location: [facility] END[facility]COPY;  Service: Endoscopy;  Laterality: N/A;   COLONOSCOPY WITH PROPOFOL  N/A 03/15/2022   Procedure: COLONOSCOPY WITH PROPOFOL ;  Surgeon: Onita Elspeth Sharper, DO;  Location: [facility] ENDOSCOPY;  Service: Gastroenterology;  Laterality: N/A;   CORRECTION HAMMER TOE     ESOPHAGOGASTRODUODENOSCOPY N/A 06/11/2022   Procedure: ESOPHAGOGASTRODUODENOSCOPY (EGD);  Surgeon: Onita Elspeth Sharper, DO;  Location: [facility] ENDOSCOPY;  Service: Gastroenterology;  Laterality: N/A;   ESOPHAGOGASTRODUODENOSCOPY (EGD) WITH PROPOFOL  N/A 03/15/2022   Procedure: ESOPHAGOGASTRODUODENOSCOPY (EGD) WITH PROPOFOL ;  Surgeon: Onita Elspeth Sharper,  DO;  Location: [facility] ENDOSCOPY;  Service: Gastroenterology;  Laterality: N/A;   EYE SURGERY Right    cataract   FRACTURE SURGERY     HIP FRACTURE SURGERY Right    pins in hip   JOINT REPLACEMENT     KNEE ARTHROPLASTY Left    total knee replacement   HPI:  Pt is a 73 y.o. 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  At baseline he walks with a cane.  According to the ED provider who spoke with family, they are concerned about patient having increasing difficulty managing on his own.  He currently lives alone.    MRI/CT Head:   1. No acute intracranial abnormality.  2. Pronounced left cerebral and moderate left cerebellar volume loss with  marked chronic enlargement of the left lateral ventricle and moderately  prominent right ventricle.  3. Mild-to-moderate periventricular white matter disease.   Assessment / Plan / Recommendation Clinical Impression   Pt seen today for informal Cognitive-linguistic screening/assessment at bedside using Interview and the SLUMS Examination assessment tool.  Pt awake, verbal and followed all directions w/ cue. Pt was quite pleasant and engaged appropriately w/ this SLP and Sister present in room. He participated in choosing food items for his lunch/dinner meals w/ this SLP; orders placed for him. Pt stated i want to know what's going on before i leave here.  Noted Family's concern of pt's welfare in the home, per the H&P note(pt lives alone at home). Pt is still drives a car.   On RA, afebrile. WBC WNL. Noted MRI/Head CT Imaging this admit: 1. No acute intracranial abnormality.  2. Pronounced left cerebral and moderate left cerebellar volume loss with  marked chronic enlargement of the left lateral ventricle and moderately  prominent right ventricle.  3. Mild-to-moderate periventricular white matter disease.   Pt appears to present with Cognitive  communication impairment characterized by deficits in the areas of attention, awareness/insight, memory recall, mental flexibility, and executive functioning re: problem-solving and reasoning. Performance on SLUMS revealed a Score 19/30 which with HS education is indicative of Neurocognitive deficits. Deficits may impact ability to independently manage finances, Medications, and maintain safety in the home environment, especially if alone. Unsure of pt's Baseline Cognitive functioning but concern is present per Family's report -- see H&P note.   Primary deficits were associated with memory retrieval tasks, basic calculations, insight of self and problem-situations, and tasks of higher level executive functioning. Pt's performance improved with cueing during tasks. His strengths included written information tasks.  With Imaging reports, Family's report, and performance today w/ Therapies, suspect Cognitive-linguistic decline and need for Supervision in the home and during ADLs for safety and success. F/u Cognitive-linguistic intervention could be beneficial w/ ongoing assessment of Cognitive ADLs in the D/C and/or home environment. Would recommend f/u w/ Neurology for formal assessment of pt's Cognitive function/status in setting of Baseline concern.  Recommend f/u ST services in the Outpatient/home/SNF setting to continue to address Cognitive tasks in ADLs for safety and success and decrease caregiver burden. Both pt and Sister in agreement w/ POC. MD/NSG/TOC  updated.    SLP Assessment  SLP Recommendation/Assessment: All further Speech Language Pathology needs can be addressed in the next venue of care SLP Visit Diagnosis: Cognitive communication deficit (R41.841) (ongoing assessment at next venue of care; highlight safety awareness in ADLs)     Assistance Recommended at Discharge  Intermittent Supervision/Assistance  Functional Status Assessment  (unsure at this time- see MRI/CT)  Frequency and  Duration  (n/a)   (n/a)      SLP Evaluation Cognition  Overall Cognitive Status: History of cognitive impairments - at baseline (suspected per Family report) Arousal/Alertness: Awake/alert Orientation Level: Oriented to person;Oriented to place;Oriented to situation (though lacked some details as the situation) Year: 2025 (self-corrected) Day of Week: Incorrect Attention: Focused;Sustained Focused Attention: Appears intact Sustained Attention: Appears intact Memory: Impaired Memory Impairment: Decreased recall of new information Awareness: Impaired Awareness Impairment: Anticipatory impairment Problem Solving: Impaired Problem Solving Impairment: Verbal complex Executive Function: Reasoning Reasoning: Impaired Reasoning Impairment: Verbal complex Behaviors:  (n/a) Safety/Judgment: Impaired (needs further assessment; reduced insight/awareness noted) Comments: further assessment especially w/ ADLs such as driving a car       Comprehension  Auditory Comprehension Overall Auditory Comprehension: Appears within functional limits for tasks assessed (for functional tasks)    Expression Expression Primary Mode of Expression: Verbal Verbal Expression Overall Verbal Expression: Appears within functional limits for tasks assessed (for functional tasks) Written Expression Dominant Hand: Left   Oral / Motor  Oral Motor/Sensory Function Overall Oral Motor/Sensory Function: Within functional limits Motor Speech Overall Motor Speech: Appears within functional limits for tasks assessed              Comer Portugal, MS, CCC-SLP Speech Language Pathologist Rehab Services; [facility] [place] [phone] (ascom) Dmonte Maher 07/11/2024, 3:14 PM

## 2024-07-11 NOTE — H&P (Addendum)
 History and Physical    Patient: Anthony Evans FMW:[id number] DOB: 01-01-1951 DOA: 07/10/2024 DOS: the patient was seen and examined on 07/11/2024 PCP: Sadie Manna, MD  Patient coming from: Home  Chief Complaint:  Chief Complaint  Patient presents with   Weakness   HPI: Anthony Evans is a 73 y.o. male with medical history significant of In utero stroke with right hemiparesis, hypertension, hypothyroidism, lumbar degenerative disc disease, BPH, remote history of seizures on multiple AEDs, followed by neurology, being admitted with a several week history of frequent falls and more recently, unsteady gait, new left-sided tingling, leaning to the left, with a presyncopal episode on the day of arrival as he was making his way to the car.  At baseline he walks with a cane.  According to the ED provider who spoke with family, they are concerned about patient having increasing difficulty managing on his own.  He currently lives alone.  In the ED, initially mildly tachycardic to 109 but otherwise normal vitals.  Labs notable for hemoglobin of 9, baseline 10.3 from 2 years ago and creatinine 1.4, baseline 0.85 from 2 years ago.  Urinalysis unremarkable.  EKG showed sinus at 93 CT head was done followed by MRI.  MRI was negative for acute stroke and showed unchanged ventricular enlargement from prior. Admission requested.  Review of Systems: As mentioned in the history of present illness. All other systems reviewed and are negative. Past Medical History:  Diagnosis Date   Allergy    Arthritis    psoriatic   Back pain    Cataract    Cervical radiculitis    Degenerative disc disease    Difficulty walking    Diverticulitis    Falls frequently    GERD (gastroesophageal reflux disease)    Hemiplegia and hemiparesis following cerebral infarction affecting right dominant side (HCC)    Hx of adenomatous colonic polyps    Hypertension    Hypertriglyceridemia    Hypothyroidism    OA  (osteoarthritis)    Psoriasis    Psoriatic arthritis (HCC)    Reflux    Seizures (HCC)    last seizure about 10 years ago   Stroke [facility])    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   Thalassemia    Thalassemia, beta (HCC)    Thyroid disease    Past Surgical History:  Procedure Laterality Date   BACK SURGERY     screws and plate in c4 and c5   CATARACT EXTRACTION W/PHACO Left 05/15/2015   Procedure: CATARACT EXTRACTION PHACO AND INTRAOCULAR LENS PLACEMENT (IOC);  Surgeon: Newell Ovens, MD;  Location: [facility] ORS;  Service: Ophthalmology;  Laterality: Left;  US : 2:40.9 AP: 17.3 CDE: 27.88   CERVICAL FUSION     COLONOSCOPY N/A 11/15/2022   Procedure: COLONOSCOPY;  Surgeon: Onita Elspeth Sharper, DO;  Location: [facility] ENDOSCOPY;  Service: Gastroenterology;  Laterality: N/A;   COLONOSCOPY WITH PROPOFOL  N/A 06/14/2016   Procedure: COLONOSCOPY WITH PROPOFOL ;  Surgeon: Lamar ONEIDA Holmes, MD;  Location: [facility] ENDOSCOPY;  Service: Endoscopy;  Laterality: N/A;   COLONOSCOPY WITH PROPOFOL  N/A 03/15/2022   Procedure: COLONOSCOPY WITH PROPOFOL ;  Surgeon: Onita Elspeth Sharper, DO;  Location: [facility] ENDOSCOPY;  Service: Gastroenterology;  Laterality: N/A;   CORRECTION HAMMER TOE     ESOPHAGOGASTRODUODENOSCOPY N/A 06/11/2022   Procedure: ESOPHAGOGASTRODUODENOSCOPY (EGD);  Surgeon: Onita Elspeth Sharper, DO;  Location: [facility] ENDOSCOPY;  Service: Gastroenterology;  Laterality: N/A;   ESOPHAGOGASTRODUODENOSCOPY (EGD) WITH PROPOFOL  N/A 03/15/2022   Procedure: ESOPHAGOGASTRODUODENOSCOPY (EGD) WITH PROPOFOL ;  Surgeon:  Onita Elspeth Sharper, DO;  Location: [facility] ENDOSCOPY;  Service: Gastroenterology;  Laterality: N/A;   EYE SURGERY Right    cataract   FRACTURE SURGERY     HIP FRACTURE SURGERY Right    pins in hip   JOINT REPLACEMENT     KNEE ARTHROPLASTY Left    total knee replacement   Social History:  reports that he has never smoked. He has never used smokeless tobacco. He reports that he does not currently use drugs. He reports  that he does not drink alcohol.  Allergies  Allergen Reactions   Etanercept Nausea And Vomiting   Etodolac Nausea And Vomiting   Rubbing Alcohol [Isopropyl Alcohol] Rash    Family History  Problem Relation Age of Onset   Breast cancer Mother        dx 3s   Prostate cancer Father        d. 81   Colon polyps Father    Breast cancer Sister 70   Uterine cancer Sister        dx 22s   Cancer Paternal Aunt        multiple aunts; unknown types   Prostate cancer Paternal Uncle        at least 2 uncles   Cancer Other    Diabetes Other    Arthritis Other     Prior to Admission medications   Medication Sig Start Date End Date Taking? Authorizing Provider  amLODipine (NORVASC) 5 MG tablet Take 5 mg by mouth daily.    [provider]  amoxicillin (AMOXIL) 500 MG capsule Take 500 mg by mouth 4 (four) times daily as needed (dental). Patient not taking: Reported on 02/03/2024    [provider]  Ascorbic Acid  (VITAMIN C ) 1000 MG tablet Take 1,000 mg by mouth daily.    [provider]  aspirin  EC 81 MG tablet Take 81 mg by mouth daily. Swallow whole.    [provider]  Ca Carbonate-Mag Hydroxide (ROLAIDS) 550-110 MG CHEW Chew by mouth.    [provider]  Calcipotriene-Betameth Diprop (WYNZORA ) 0.005-0.064 % CREA Apply to hands 5 nights a week at bedtime 04/27/21   Hester Alm BROCKS, MD  calcium carbonate (OSCAL) 1500 (600 Ca) MG TABS tablet Take by mouth 2 (two) times daily with a meal.    [provider]  carbamazepine  (TEGRETOL ) 200 MG tablet Take 400 mg by mouth 3 (three) times daily.    [provider]  cetirizine (ZYRTEC) 10 MG tablet Take 10 mg by mouth daily.    [provider]  clobetasol cream (TEMOVATE) 0.05 % Apply 1 application. topically 2 (two) times daily.    [provider]  Cyanocobalamin  (VITAMIN B 12) 100 MCG LOZG Take 100 mcg by mouth daily.    [provider]  docusate sodium   (COLACE) 100 MG capsule Take 100 mg by mouth daily.    [provider]  fenofibrate (TRICOR) 48 MG tablet Take 48 mg by mouth daily. 09/29/21   [provider]  folic acid  (FOLVITE ) 1 MG tablet Take 1 mg by mouth daily.    [provider]  gabapentin  (NEURONTIN ) 300 MG capsule Take 300 mg by mouth 2 (two) times daily. 11/18/21   [provider]  HYDROcodone -acetaminophen  (NORCO/VICODIN) 5-325 MG tablet Take 1 tablet by mouth as needed for moderate pain (pain score 4-6).    [provider]  hydrocortisone 2.5 % ointment Apply topically as needed.    [provider]  hydroxychloroquine  (PLAQUENIL ) 200 MG tablet Take 200 mg by mouth daily. Patient not taking: Reported on 02/03/2024 02/23/18   [provider]  ketoconazole  (NIZORAL ) 2 % cream Apply to both feet and between toes once daily for 6 weeks. 05/10/24   Gaynel Delon CROME, DPM  levothyroxine  (SYNTHROID , LEVOTHROID) 175 MCG tablet Take 175 mcg by mouth daily.  04/24/14   [provider]  meclizine  (ANTIVERT ) 25 MG tablet Take 1 tablet (25 mg total) by mouth 3 (three) times daily as needed for dizziness. 12/22/21   Willo Dunnings, MD  meloxicam  (MOBIC ) 7.5 MG tablet Take 7.5 mg by mouth 2 (two) times daily.     [provider]  methotrexate  (RHEUMATREX) 2.5 MG tablet Take 10 mg by mouth every Wednesday.  12/26/17   [provider]  Methotrexate , Anti-Rheumatic, (RHEUMATREX PO) Take 2.5 mg by mouth every 7 (seven) days. TAKE 7 TABLETS EVERY 7 DAYS    [provider]  Multiple Vitamins-Minerals (CENTRUM SILVER 50+WOMEN) TABS Take 1 tablet by mouth daily.    [provider]  primidone  (MYSOLINE ) 250 MG tablet Take 250 mg by mouth 3 (three) times daily.     [provider]  tamsulosin  (FLOMAX ) 0.4 MG CAPS capsule Take 1 capsule (0.4 mg total) by mouth daily. 09/15/18   Tobie Press, MD  timolol  (TIMOPTIC ) 0.5 % ophthalmic solution Place  1 drop into both eyes daily. 12/29/17   [provider]  triamcinolone cream (KENALOG) 0.1 % Apply 1 application topically See admin instructions. 01/21/15   [provider]  triamterene -hydrochlorothiazide  (MAXZIDE ) 75-50 MG per tablet Take 1 tablet by mouth daily.     [provider]  vitamin E 400 UNIT capsule Take 400 Units by mouth daily.    [provider]    Physical Exam: Vitals:   07/10/24 1943 07/10/24 2230 07/10/24 2327 07/11/24 0056  BP:  133/83  (!) 156/83  Pulse:  85  81  Resp:  15  18  Temp: 98.6 F (37 C)  98.4 F (36.9 C) 98.9 F (37.2 C)  TempSrc: Oral  Oral   SpO2:  96%  99%  Weight:      Height:       Physical Exam Vitals and nursing note reviewed.  Constitutional:      General: He is not in acute distress. HENT:     Head: Normocephalic and atraumatic.  Cardiovascular:     Rate and Rhythm: Normal rate and regular rhythm.     Heart sounds: Normal heart sounds.  Pulmonary:     Effort: Pulmonary effort is normal.     Breath sounds: Normal breath sounds.  Abdominal:     Palpations: Abdomen is soft.     Tenderness: There is no abdominal tenderness.  Neurological:     Mental Status: Mental status is at baseline.     Data Reviewed: Relevant notes from primary care and specialist visits, past discharge summaries as available in EHR, including Care Everywhere. Prior diagnostic testing as pertinent to current admission diagnoses Updated medications and problem lists for reconciliation ED course, including vitals, labs, imaging, treatment and response to treatment Triage notes, nursing and pharmacy notes and ED provider's notes Notable results as noted in HPI   Assessment and Plan: * Unsteady gait Presyncope MRI negative for stroke Patient reported right-sided numbness for several days, unsteady gait Will still get stroke/syncope workup with continuous cardiac monitoring, echocardiogram Orthostatic vital  signs Consider neurology consult in the a.m. 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 Observation status is judged to be reasonable and necessary in order to provide the required intensity of service to ensure the patient's safety. The patient's presenting symptoms, physical exam findings, and initial radiographic and laboratory data in the context of their medical condition is felt to place them at decreased risk for further clinical deterioration. Furthermore, it is anticipated that the patient will be medically stable for discharge from the hospital within 2 midnights of admission.   Author: Delayne LULLA Solian, MD 07/11/2024 3:41 AM  For on call review [email or web address].

## 2024-07-11 NOTE — Plan of Care (Signed)

## 2024-07-11 NOTE — Consult Note (Signed)
 NEUROLOGY CONSULT NOTE   Date of service: July 11, 2024 Patient Name: Anthony Evans MRN:  [id number] DOB:  08-Sep-1951 Chief Complaint: Recent onset of left sided weakness Requesting Provider: Leesa Kast, DO  History of Present Illness  Anthony Evans is a 73 y.o. male with a PMHx of in utero stroke with chronic right hemiparesis, hypertension, hypothyroidism, lumbar degenerative disc disease, prior cervical fusion at C4 and C5, BPH, and a remote history of seizures on carbamazepine  and gabapentin  (followed by neurology outpatient), who presented yesterday with a several week history of frequent falls and more recently, unsteady gait, new left-sided tingling and leaning to the left. He had a presyncopal episode on the day of presentation as he was making his way to the car. At baseline he walks with a cane. In the ED, he was initially mildly tachycardic to 109 but otherwise was with normal vitals.    MRI brain was negative for acute stroke and showed unchanged ventricular enlargement from prior.  He formerly was also on primidone  for seizure control, but is no longer taking this medication.    ROS  Comprehensive ROS performed and pertinent positives documented in HPI    Past History   Past Medical History:  Diagnosis Date   Allergy    Arthritis    psoriatic   Back pain    Cataract    Cervical radiculitis    Degenerative disc disease    Difficulty walking    Diverticulitis    Falls frequently    GERD (gastroesophageal reflux disease)    Hemiplegia and hemiparesis following cerebral infarction affecting right dominant side (HCC)    Hx of adenomatous colonic polyps    Hypertension    Hypertriglyceridemia    Hypothyroidism    OA (osteoarthritis)    Psoriasis    Psoriatic arthritis (HCC)    Reflux    Seizures (HCC)    last seizure about 10 years ago   Stroke [facility])    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   Thalassemia    Thalassemia, beta (HCC)    Thyroid disease     Past Surgical  History:  Procedure Laterality Date   BACK SURGERY     screws and plate in c4 and c5   CATARACT EXTRACTION W/PHACO Left 05/15/2015   Procedure: CATARACT EXTRACTION PHACO AND INTRAOCULAR LENS PLACEMENT (IOC);  Surgeon: Newell Ovens, MD;  Location: [facility] ORS;  Service: Ophthalmology;  Laterality: Left;  US : 2:40.9 AP: 17.3 CDE: 27.88   CERVICAL FUSION     COLONOSCOPY N/A 11/15/2022   Procedure: COLONOSCOPY;  Surgeon: Onita Elspeth Sharper, DO;  Location: [facility] ENDOSCOPY;  Service: Gastroenterology;  Laterality: N/A;   COLONOSCOPY WITH PROPOFOL  N/A 06/14/2016   Procedure: COLONOSCOPY WITH PROPOFOL ;  Surgeon: Lamar ONEIDA Holmes, MD;  Location: [facility] ENDOSCOPY;  Service: Endoscopy;  Laterality: N/A;   COLONOSCOPY WITH PROPOFOL  N/A 03/15/2022   Procedure: COLONOSCOPY WITH PROPOFOL ;  Surgeon: Onita Elspeth Sharper, DO;  Location: [facility] ENDOSCOPY;  Service: Gastroenterology;  Laterality: N/A;   CORRECTION HAMMER TOE     ESOPHAGOGASTRODUODENOSCOPY N/A 06/11/2022   Procedure: ESOPHAGOGASTRODUODENOSCOPY (EGD);  Surgeon: Onita Elspeth Sharper, DO;  Location: [facility] ENDOSCOPY;  Service: Gastroenterology;  Laterality: N/A;   ESOPHAGOGASTRODUODENOSCOPY (EGD) WITH PROPOFOL  N/A 03/15/2022   Procedure: ESOPHAGOGASTRODUODENOSCOPY (EGD) WITH PROPOFOL ;  Surgeon: Onita Elspeth Sharper, DO;  Location: [facility] ENDOSCOPY;  Service: Gastroenterology;  Laterality: N/A;   EYE SURGERY Right    cataract   FRACTURE SURGERY     HIP FRACTURE SURGERY Right  pins in hip   JOINT REPLACEMENT     KNEE ARTHROPLASTY Left    total knee replacement    Family History: Family History  Problem Relation Age of Onset   Breast cancer Mother        dx 24s   Prostate cancer Father        d. 94   Colon polyps Father    Breast cancer Sister 78   Uterine cancer Sister        dx 16s   Cancer Paternal Aunt        multiple aunts; unknown types   Prostate cancer Paternal Uncle        at least 2 uncles   Cancer Other    Diabetes Other     Arthritis Other     Social History  reports that he has never smoked. He has never used smokeless tobacco. He reports that he does not currently use drugs. He reports that he does not drink alcohol.  Allergies  Allergen Reactions   Etanercept Nausea And Vomiting   Etodolac Nausea And Vomiting   Rubbing Alcohol [Isopropyl Alcohol] Rash    Medications   Current Facility-Administered Medications:    [START ON 07/12/2024]  stroke: early stages of recovery book, , Does not apply, Once, Anthony Delayne GAILS, MD   0.9 %  sodium chloride  infusion, , Intravenous, Continuous, Anthony Delayne GAILS, MD, Last Rate: 40 mL/hr at 07/11/24 0318, Infusion Verify at 07/11/24 0318   acetaminophen  (TYLENOL ) tablet 650 mg, 650 mg, Oral, Q4H PRN **OR** acetaminophen  (TYLENOL ) 160 MG/5ML solution 650 mg, 650 mg, Per Tube, Q4H PRN **OR** acetaminophen  (TYLENOL ) suppository 650 mg, 650 mg, Rectal, Q4H PRN, Anthony Delayne GAILS, MD   aspirin  EC tablet 81 mg, 81 mg, Oral, Daily, Anthony Delayne GAILS, MD, 81 mg at 07/11/24 9175   carbamazepine  (TEGRETOL ) tablet 400 mg, 400 mg, Oral, TID, Anthony Delayne GAILS, MD, 400 mg at 07/11/24 9175   enoxaparin  (LOVENOX ) injection 50 mg, 50 mg, Subcutaneous, Q24H, Anthony Delayne GAILS, MD, 50 mg at 07/11/24 9175   gabapentin  (NEURONTIN ) capsule 300 mg, 300 mg, Oral, q morning, 300 mg at 07/11/24 9176 **AND** gabapentin  (NEURONTIN ) capsule 600 mg, 600 mg, Oral, q1600 **AND** gabapentin  (NEURONTIN ) capsule 900 mg, 900 mg, Oral, QHS, Belue, Nathan S, RPH   HYDROcodone -acetaminophen  (NORCO/VICODIN) 5-325 MG per tablet 1 tablet, 1 tablet, Oral, Q6H PRN, Anthony Delayne GAILS, MD   levothyroxine  (SYNTHROID ) tablet 175 mcg, 175 mcg, Oral, Q0600, Anthony Delayne GAILS, MD   tamsulosin  (FLOMAX ) capsule 0.4 mg, 0.4 mg, Oral, Daily, Dezii, Alexandra, DO  No current facility-administered medications on file prior to encounter.   Current Outpatient Medications on File Prior to Encounter  Medication Sig Dispense Refill   amLODipine  (NORVASC) 5 MG tablet Take 5 mg by mouth daily.     Ascorbic Acid  (VITAMIN C ) 1000 MG tablet Take 1,000 mg by mouth daily.     aspirin  EC 81 MG tablet Take 81 mg by mouth daily. Swallow whole.     Ca Carbonate-Mag Hydroxide (ROLAIDS) 550-110 MG CHEW Chew 1 tablet by mouth as needed (indigestion).     Calcipotriene-Betameth Diprop (WYNZORA ) 0.005-0.064 % CREA Apply to hands 5 nights a week at bedtime 60 g 1   calcium carbonate (OSCAL) 1500 (600 Ca) MG TABS tablet Take 1,500 mg by mouth 2 (two) times daily with a meal. Pt takes 1 tablet qam and 2 tablets qpm     carbamazepine  (TEGRETOL ) 200 MG tablet Take 400  mg by mouth 3 (three) times daily.     clobetasol cream (TEMOVATE) 0.05 % Apply 1 application  topically 2 (two) times daily as needed (psoriasis).     Cyanocobalamin  (VITAMIN B 12) 100 MCG LOZG Take 100 mcg by mouth daily.     docusate sodium  (COLACE) 100 MG capsule Take 100 mg by mouth daily as needed for mild constipation.     fenofibrate (TRICOR) 48 MG tablet Take 48 mg by mouth daily.     folic acid  (FOLVITE ) 1 MG tablet Take 1 mg by mouth daily.     gabapentin  (NEURONTIN ) 300 MG capsule Take 300-900 mg by mouth 3 (three) times daily. Pt takes 300mg  qam, 600mg  in afternoon, 900mg  at bedtime     HYDROcodone -acetaminophen  (NORCO/VICODIN) 5-325 MG tablet Take 1 tablet by mouth as needed for moderate pain (pain score 4-6).     levothyroxine  (SYNTHROID , LEVOTHROID) 175 MCG tablet Take 175 mcg by mouth daily.      meclizine  (ANTIVERT ) 25 MG tablet Take 1 tablet (25 mg total) by mouth 3 (three) times daily as needed for dizziness. 30 tablet 0   meloxicam  (MOBIC ) 7.5 MG tablet Take 7.5 mg by mouth daily.     Multiple Vitamins-Minerals (CENTRUM SILVER 50+WOMEN) TABS Take 1 tablet by mouth daily.     tamsulosin  (FLOMAX ) 0.4 MG CAPS capsule Take 1 capsule (0.4 mg total) by mouth daily. 30 capsule 0   timolol  (TIMOPTIC ) 0.5 % ophthalmic solution Place 1 drop into both eyes daily.  2    triamterene -hydrochlorothiazide  (MAXZIDE ) 75-50 MG per tablet Take 1 tablet by mouth daily.      vitamin E 400 UNIT capsule Take 400 Units by mouth daily.     cetirizine (ZYRTEC) 10 MG tablet Take 10 mg by mouth at bedtime.     Methotrexate , Anti-Rheumatic, (RHEUMATREX PO) Take 2.5 mg by mouth every 7 (seven) days. TAKE 7 TABLETS (17.5 mg)  EVERY 7 DAYS on Wednesdays     primidone  (MYSOLINE ) 250 MG tablet Take 250 mg by mouth 3 (three) times daily.  (Patient not taking: Reported on 07/11/2024)     triamcinolone cream (KENALOG) 0.1 % Apply 1 application  topically 2 (two) times daily as needed.       Vitals   Vitals:   07/10/24 2230 07/10/24 2327 07/11/24 0056 07/11/24 0340  BP: 133/83  (!) 156/83   Pulse: 85  81   Resp: 15  18 20   Temp:  98.4 F (36.9 C) 98.9 F (37.2 C) 98.2 F (36.8 C)  TempSrc:  Oral  Oral  SpO2: 96%  99% 97%  Weight:      Height:        Body mass index is 32.28 kg/m.   Physical Exam   Constitutional: Appears well-developed and well-nourished.  Psych: Affect appropriate to situation.  Eyes: No scleral injection.  HENT: No OP obstruction.  Head: Normocephalic.  Respiratory: Effort normal, non-labored breathing.    Neurologic Examination   Mental Status: Awake and alert. Fully oriented. Thought content appropriate. Speech fluent without evidence of aphasia.  Able to follow all commands without difficulty. Good insight. Pleasant and cooperative.  Cranial Nerves: II: Temporal visual fields intact with no extinction to DSS.  III,IV, VI: No ptosis. EOMI but with saccadic pursuits to the right. No nystagmus. V: Temp sensation decreased on the left VII: Smile symmetric, but with subtle delay on the right.  VIII: Hearing intact to voice IX,X: No hypophonia or hoarseness XI: Symmetric XII: Midline tongue  extension Motor: RUE: 4/5 proximally and distally, with decreased muscle bulk relative to the left.  LUE: 5/5 Synkinesis noted with fine finger movements  bilaterally RLE: 4/5 HF, 4+/5 KE, 4/5 KF, 1/5 ADF, 4-/5 APF. Decreased muscle bulk, worse distally LLE: 5/5 Sensory: Temp and FT intact to BLE. Decreased temp sensation to RUE.  Deep Tendon Reflexes: 2+ left biceps and brachioradialis. Low-amplitude and brisk right biceps and brachioradialis. Crossed adductor response on the right when attempting to elicit left patellar which has trace movement when eliciting. Brisk, low amplitude right patellar.  Plantars: Right: downgoingLeft: downgoing Negative Hoffman's sign.  Cerebellar: No ataxia with FNF bilaterally, but slower and less fluid on the right.  Gait: Deferred   Labs/Imaging/Neurodiagnostic studies   CBC:  Recent Labs  Lab July 27, 2024 1427  WBC 6.8  NEUTROABS 5.2  HGB 9.0*  HCT 29.3*  MCV 71.3*  PLT 289   Basic Metabolic Panel:  Lab Results  Component Value Date   NA 141 2024-07-27   K 3.7 07-27-24   CO2 31 2024-07-27   GLUCOSE 136 (H) July 27, 2024   BUN 30 (H) 2024-07-27   CREATININE 1.40 (H) 07-27-2024   CALCIUM 10.6 (H) Jul 27, 2024   GFRNONAA 54 (L) 27-Jul-2024   GFRAA >60 12/28/2019   Lipid Panel:  Lab Results  Component Value Date   LDLCALC 99 07/11/2024   HgbA1c:  Lab Results  Component Value Date   HGBA1C 4.8 11/05/2019   Urine Drug Screen: No results found for: LABOPIA, COCAINSCRNUR, LABBENZ, AMPHETMU, THCU, LABBARB  Alcohol Level     Component Value Date/Time   [facility] <15 07-27-2024 1430   INR  Lab Results  Component Value Date   INR 1.1 2024/07/27   APTT  Lab Results  Component Value Date   APTT 31 07/27/2024   TTE: 1. Left ventricular ejection fraction, by estimation, is 60 to 65%. The  left ventricle has normal function. The left ventricle has no regional  wall motion abnormalities. Left ventricular diastolic parameters are  consistent with Grade I diastolic dysfunction (impaired relaxation).   2. Right ventricular systolic function is normal. The right ventricular  size is  normal. There is normal pulmonary artery systolic pressure. The  estimated right ventricular systolic pressure is 21.6 mmHg.   3. The mitral valve is normal in structure. No evidence of mitral valve  regurgitation. No evidence of mitral stenosis. Moderate mitral annular  calcification.   4. The aortic valve is calcified. There is severe calcifcation of the  aortic valve. Aortic valve regurgitation is not visualized. Moderate  aortic valve stenosis. Aortic valve area, by VTI measures 1.69 cm. Aortic  valve mean gradient measures 20.0 mmHg.  Aortic valve Vmax  measures 3.05 m/s.   5. The inferior vena cava is normal in size with greater than 50%  respiratory variability, suggesting right atrial pressure of 3 mmHg.   6. Agitated saline contrast bubble study was negative, with no evidence  of any interatrial shunt.   ASSESSMENT  73 year old male with a PMHx of left hemispheric in utero stroke with chronic right hemiparesis, hypertension, hypothyroidism, lumbar degenerative disc disease, prior cervical fusion at C4 and C5, BPH, and a remote history of seizures on carbamazepine  and gabapentin  (followed by neurology outpatient), who presented yesterday with a several week history of frequent falls and more recently, unsteady gait, new left-sided tingling and leaning to the left. He had a presyncopal episode on the day of presentation as he was making his way to the car.  At baseline he walks with a cane. In the ED, he was initially mildly tachycardic to 109 but otherwise was with normal vitals. MRI brain was negative for acute stroke and showed unchanged ventricular enlargement from prior. - Exam reveals chronic right UMN deficits and leg-length discrepancy (shorter on the right), with normal left sided strength, but decreased sensation to LUE.  - CT head: No acute intracranial abnormality. Pronounced left cerebral and moderate left cerebellar volume loss with marked chronic enlargement of the left  lateral ventricle and moderately prominent right ventricle. Mild-to-moderate periventricular white matter disease. - MRI brain: No evidence of acute intracranial abnormality. Left cerebral hemisphere volume loss with ex vacuo ventricular enlargement, unchanged. - TTE with no mural thrombus or valvular vegetation. LVEF 60 to 65%. - EKG: Sinus rhythm; Prolonged PR interval; Anteroseptal infarct, age indeterminate; Baseline wander in lead(s) V6 - Impression: New onset of left sided weakness and numbness of unknown etiology. Brain MRI negative for any acute abnormality. DDx includes possible cervical spinal cord compression versus radiculopathy. No recent seizures to suggest Todd's paresis as the etiology.     RECOMMENDATIONS  - Vitamin B12, B1, RPR and HIV testing have been ordered by Hospitalist service.  - Continue home ASA - Continue home carbamazepine  and Neurontin .  - MRI of cervical spine (ordered) - PT/OT - IVF ______________________________________________________________________    Anthony Evans, Anthony Peach, MD [facility]

## 2024-07-11 NOTE — Assessment & Plan Note (Signed)
 Presyncope MRI negative for stroke Patient reported right-sided numbness for several days, unsteady gait Will still get stroke/syncope workup with continuous cardiac monitoring, echocardiogram Orthostatic vital signs Consider neurology consult in the a.m. PT OT consult

## 2024-07-11 NOTE — Progress Notes (Signed)
*  PRELIMINARY RESULTS* Echocardiogram 2D Echocardiogram has been performed.  Anthony Evans 07/11/2024, 2:12 PM

## 2024-07-11 NOTE — Evaluation (Signed)
 Physical Therapy Evaluation Patient Details Name: Anthony Evans MRN: [id number] DOB: 1951/08/31 Today's Date: 07/11/2024  History of Present Illness  Pt is a 73 y.o. male with medical history significant of In utero stroke with right hemiparesis, hypertension, hypothyroidism, lumbar degenerative disc disease, BPH, remote history of seizures on multiple AEDs, followed by neurology, being admitted with a several week history of frequent falls and more recently, unsteady gait, new left-sided tingling, leaning to the left, with a presyncopal episode on the day of arrival as he was making his way to the car. MD assessment includes: unsteady gait, presyncope, seizure disorder, and HTN.   Clinical Impression  Pt was pleasant and motivated to participate during the session and put forth good effort throughout. Pt required use of bed rails and extra time and effort with bed mobility tasks but no physical assistance.  Pt required the EOB to be elevated along with cuing for sequencing to come to standing and then required min A on occasion for stability during gait training with step-to pattern to address RLE weakness/mild buckling.  Pt reported no adverse symptoms during the session with SpO2 and HR WNL throughout on room air.  Pt will benefit from continued PT services upon discharge to safely address deficits listed in patient problem list for decreased caregiver assistance and eventual return to PLOF.           If plan is discharge home, recommend the following: A lot of help with walking and/or transfers;A little help with bathing/dressing/bathroom;Assistance with cooking/housework;Assist for transportation;Help with stairs or ramp for entrance   Can travel by private vehicle   No    Equipment Recommendations Other (comment) (TBD at next venue of care)  Recommendations for Other Services       Functional Status Assessment Patient has had a recent decline in their functional status and  demonstrates the ability to make significant improvements in function in a reasonable and predictable amount of time.     Precautions / Restrictions Precautions Precautions: Fall Recall of Precautions/Restrictions: Intact Restrictions Weight Bearing Restrictions Per Provider Order: No      Mobility  Bed Mobility Overal bed mobility: Modified Independent             General bed mobility comments: Extra time, effort, and use of bed rail required    Transfers Overall transfer level: Needs assistance Equipment used: Rolling walker (2 wheels) Transfers: Sit to/from Stand Sit to Stand: Contact guard assist           General transfer comment: Extra time, effort, and cuing for sequencing required during transfer training from various surfaces    Ambulation/Gait Ambulation/Gait assistance: Contact guard assist, Min assist Gait Distance (Feet): 10 Feet x 2 Assistive device: Rolling walker (2 wheels) Gait Pattern/deviations: Step-to pattern, Decreased step length - left, Decreased stance time - right, Trunk flexed Gait velocity: decreased     General Gait Details: Min A for stability with minor R knee buckling at times that improved with cuing for step-to sequencing  Stairs            Wheelchair Mobility     Tilt Bed    Modified Rankin (Stroke Patients Only)       Balance Overall balance assessment: Needs assistance Sitting-balance support: Feet supported Sitting balance-Leahy Scale: Good     Standing balance support: Reliant on assistive device for balance, Bilateral upper extremity supported, During functional activity Standing balance-Leahy Scale: Poor  Pertinent Vitals/Pain Pain Assessment Pain Assessment: No/denies pain    Home Living Family/patient expects to be discharged to:: Private residence Living Arrangements: Alone Available Help at Discharge: Family;Available PRN/intermittently Type of Home:  House Home Access: Ramped entrance       Home Layout: One level Home Equipment: Agricultural consultant (2 wheels);Cane - quad;Cane - single point      Prior Function Prior Level of Function : Independent/Modified Independent;Driving             Mobility Comments: Mod Ind amb with a SPC, 2 falls in the last 6 months both in the last week secondary to LLE buckling ADLs Comments: Ind with ADLs     Extremity/Trunk Assessment   Upper Extremity Assessment Upper Extremity Assessment: Defer to OT evaluation RUE Deficits / Details: deficits /weakness from birth LUE Deficits / Details: Pt endorsing numbness comes and goes    Lower Extremity Assessment Lower Extremity Assessment: Generalized weakness;RLE deficits/detail RLE Deficits / Details: Chronic deficits secondary to in utero CVA       Communication   Communication Communication: No apparent difficulties    Cognition Arousal: Alert Behavior During Therapy: WFL for tasks assessed/performed   PT - Cognitive impairments: No apparent impairments                         Following commands: Impaired Following commands impaired: Follows one step commands with increased time     Cueing Cueing Techniques: Verbal cues, Gestural cues, Tactile cues     General Comments      Exercises     Assessment/Plan    PT Assessment Patient needs continued PT services  PT Problem List Decreased strength;Decreased activity tolerance;Decreased balance;Decreased mobility;Decreased knowledge of use of DME       PT Treatment Interventions DME instruction;Gait training;Functional mobility training;Therapeutic exercise;Therapeutic activities;Balance training;Patient/family education    PT Goals (Current goals can be found in the Care Plan section)  Acute Rehab PT Goals Patient Stated Goal: To get stronger PT Goal Formulation: With patient Time For Goal Achievement: 07/24/24 Potential to Achieve Goals: Good    Frequency Min  2X/week     Co-evaluation               AM-PAC PT 6 Clicks Mobility  Outcome Measure Help needed turning from your back to your side while in a flat bed without using bedrails?: A Little Help needed moving from lying on your back to sitting on the side of a flat bed without using bedrails?: A Little Help needed moving to and from a bed to a chair (including a wheelchair)?: A Little Help needed standing up from a chair using your arms (e.g., wheelchair or bedside chair)?: A Little Help needed to walk in hospital room?: A Lot Help needed climbing 3-5 steps with a railing? : A Lot 6 Click Score: 16    End of Session Equipment Utilized During Treatment: Gait belt Activity Tolerance: Patient tolerated treatment well Patient left: in chair;with call bell/phone within reach;with family/visitor present Nurse Communication: Mobility status;Other (comment) (Nsg notified of pt's difficulty in walking and that there was no alarm box to plug the chair alarm into) PT Visit Diagnosis: Unsteadiness on feet (R26.81);History of falling (Z91.81);Difficulty in walking, not elsewhere classified (R26.2);Muscle weakness (generalized) (M62.81)    Time: 8540-8475 PT Time Calculation (min) (ACUTE ONLY): 25 min   Charges:   PT Evaluation $PT Eval Moderate Complexity: 1 Mod PT Treatments $Gait Training: 8-22 mins PT General Charges $$  ACUTE PT VISIT: 1 Visit       D. Glendia Bertin PT, DPT 07/11/24, 3:41 PM

## 2024-07-11 NOTE — Assessment & Plan Note (Signed)
 Continue amlodipine

## 2024-07-11 NOTE — Assessment & Plan Note (Signed)
 Continue levothyroxine  175 mg

## 2024-07-11 NOTE — Assessment & Plan Note (Signed)
 At baseline ambulates with a cane

## 2024-07-11 NOTE — Progress Notes (Signed)
   Interim no charge progress note   This patient, Anthony Evans is a 73 y.o. male who was admitted by colleague earlier this morning for new gait disturbance.  Patient has chronic right hemiparesis but endorses frequent falls outpatient and unsteady gait with left-sided tingling and leaning.  Thus far workup has been unremarkable.  Orthostatics and brain MRI WNL.  Labs reveal AKI.  Repeat labs ordered today and pending.  HIV and TSH WNL.  B12/B1/RPR ordered and pending. Neurology consulted, appreciate recommendations. At the time of my evaluation patient is calm, well-appearing, has no acute complaints.  He is requesting to remain in house until his gait disturbance is solved.     07/11/2024   12:56 AM 07/10/2024   10:30 PM 07/10/2024    7:30 PM  Vitals with BMI  Systolic 156 133 857  Diastolic 83 83 80  Pulse 81 85 89       Latest Ref Rng & Units 07/10/2024    2:27 PM 12/22/2021    2:54 AM 12/28/2019    4:18 PM  CBC  WBC 4.0 - 10.5 K/uL 6.8  7.9  6.3   Hemoglobin 13.0 - 17.0 g/dL 9.0  89.6  89.5   Hematocrit 39.0 - 52.0 % 29.3  32.8  32.6   Platelets 150 - 400 K/uL 289  335  316        Latest Ref Rng & Units 07/10/2024    2:27 PM 12/22/2021    2:54 AM 12/28/2019    4:18 PM  BMP  Glucose 70 - 99 mg/dL 863  864  898   BUN 8 - 23 mg/dL 30  32  23   Creatinine 0.61 - 1.24 mg/dL 8.59  9.14  9.45   Sodium 135 - 145 mmol/L 141  139  131   Potassium 3.5 - 5.1 mmol/L 3.7  3.8  3.8   Chloride 98 - 111 mmol/L 98  99  91   CO2 22 - 32 mmol/L 31  32  29   Calcium 8.9 - 10.3 mg/dL 89.3  89.9  9.6

## 2024-07-11 NOTE — Care Management Obs Status (Signed)
 MEDICARE OBSERVATION STATUS NOTIFICATION   Patient Details  Name: Anthony Evans MRN: [id number] Date of Birth: 06-12-1951   Medicare Observation Status Notification Given:  Yes    [name] 07/11/2024, 12:02 PM

## 2024-07-12 DIAGNOSIS — R2681 Unsteadiness on feet: Secondary | ICD-10-CM | POA: Diagnosis not present

## 2024-07-12 LAB — CBC WITH DIFFERENTIAL/PLATELET
Abs Immature Granulocytes: 0.04 K/uL (ref 0.00–0.07)
Basophils Absolute: 0 K/uL (ref 0.0–0.1)
Basophils Relative: 0 %
Eosinophils Absolute: 0.4 K/uL (ref 0.0–0.5)
Eosinophils Relative: 5 %
HCT: 31.2 % — ABNORMAL LOW (ref 39.0–52.0)
Hemoglobin: 9.3 g/dL — ABNORMAL LOW (ref 13.0–17.0)
Immature Granulocytes: 1 %
Lymphocytes Relative: 23 %
Lymphs Abs: 1.9 K/uL (ref 0.7–4.0)
MCH: 21.8 pg — ABNORMAL LOW (ref 26.0–34.0)
MCHC: 29.8 g/dL — ABNORMAL LOW (ref 30.0–36.0)
MCV: 73.1 fL — ABNORMAL LOW (ref 80.0–100.0)
Monocytes Absolute: 0.8 K/uL (ref 0.1–1.0)
Monocytes Relative: 11 %
Neutro Abs: 4.9 K/uL (ref 1.7–7.7)
Neutrophils Relative %: 60 %
Platelets: 314 K/uL (ref 150–400)
RBC: 4.27 MIL/uL (ref 4.22–5.81)
RDW: 20.9 % — ABNORMAL HIGH (ref 11.5–15.5)
WBC: 8 K/uL (ref 4.0–10.5)
nRBC: 0.3 % — ABNORMAL HIGH (ref 0.0–0.2)

## 2024-07-12 LAB — RETICULOCYTES
Immature Retic Fract: 25.3 % — ABNORMAL HIGH (ref 2.3–15.9)
RBC.: 4.26 MIL/uL (ref 4.22–5.81)
Retic Count, Absolute: 101.8 K/uL (ref 19.0–186.0)
Retic Ct Pct: 2.4 % (ref 0.4–3.1)

## 2024-07-12 LAB — FERRITIN: Ferritin: 216 ng/mL (ref 24–336)

## 2024-07-12 LAB — COMPREHENSIVE METABOLIC PANEL WITH GFR
ALT: 17 U/L (ref 0–44)
AST: 25 U/L (ref 15–41)
Albumin: 4.2 g/dL (ref 3.5–5.0)
Alkaline Phosphatase: 53 U/L (ref 38–126)
Anion gap: 10 (ref 5–15)
BUN: 21 mg/dL (ref 8–23)
CO2: 27 mmol/L (ref 22–32)
Calcium: 9.2 mg/dL (ref 8.9–10.3)
Chloride: 100 mmol/L (ref 98–111)
Creatinine, Ser: 1 mg/dL (ref 0.61–1.24)
GFR, Estimated: >60 mL/min (ref >60)
Glucose, Bld: 100 mg/dL — ABNORMAL HIGH (ref 70–99)
Potassium: 3.4 mmol/L — ABNORMAL LOW (ref 3.5–5.1)
Sodium: 137 mmol/L (ref 135–145)
Total Bilirubin: 1 mg/dL (ref 0.0–1.2)
Total Protein: 6.9 g/dL (ref 6.5–8.1)

## 2024-07-12 LAB — IRON AND TIBC
Iron: 84 ug/dL (ref 45–182)
Saturation Ratios: 31 % (ref 17.9–39.5)
TIBC: 272 ug/dL (ref 250–450)
UIBC: 188 ug/dL

## 2024-07-12 LAB — HEMOGLOBIN A1C
Hgb A1c MFr Bld: 5.2 % (ref 4.8–5.6)
Mean Plasma Glucose: 103 mg/dL

## 2024-07-12 LAB — RPR: RPR Ser Ql: NONREACTIVE

## 2024-07-12 LAB — PHOSPHORUS: Phosphorus: 3.1 mg/dL (ref 2.5–4.6)

## 2024-07-12 LAB — MAGNESIUM: Magnesium: 1.8 mg/dL (ref 1.7–2.4)

## 2024-07-12 MED ORDER — VITAMIN B-12 1000 MCG PO TABS
2000.0000 ug | ORAL_TABLET | Freq: Every day | ORAL | Status: DC
  Administered 2024-07-12 – 2024-07-13 (×2): 2000 ug via ORAL
  Filled 2024-07-12 (×2): qty 2

## 2024-07-12 MED ORDER — POTASSIUM CHLORIDE CRYS ER 20 MEQ PO TBCR
40.0000 meq | EXTENDED_RELEASE_TABLET | Freq: Once | ORAL | Status: AC
  Administered 2024-07-12: 40 meq via ORAL
  Filled 2024-07-12: qty 2

## 2024-07-12 NOTE — Progress Notes (Signed)
 PROGRESS NOTE    Anthony Evans  FMW:[id number] DOB: 1951-08-06 DOA: 07/10/2024 PCP: Sadie Manna, MD  Chief Complaint  Patient presents with   Weakness    Hospital Course:  Anthony Evans is a 73 year old male with history of in utero stroke with chronic right hemiparesis, hypertension, hypothyroidism, lumbar degenerative disc disease, prior cervical fusion at C4-C5, BPH, remote history of seizures on carbamazepine  and gabapentin , who presents with frequent falls, unsteady gait,, left-sided tingling and leaning to the left.  He also had a presyncopal episode while he was walking to his car.  In the ED he was mildly tachycardic but otherwise had normal vital signs.  Brain MRI negative for acute stroke and showed unchanged ventricular enlargement from prior.  Given significant gait disturbance patient was admitted for further workup.  Neurology was consulted.  Subjective: No acute events overnight.  Patient reports he is feeling well today.  He has no acute complaints at this time.  Discussed findings from cervical MRI.  He reports he is not interested in any additional spinal surgeries.  He is amendable to physical therapy and is interested in pursuing rehab.   Objective: Vitals:   07/11/24 1940 07/12/24 0027 07/12/24 0454 07/12/24 0826  BP:  (!) 149/87 (!) 155/83 121/64  Pulse:  85 91 82  Resp: 20 20 16 18   Temp: 98.5 F (36.9 C) 97.9 F (36.6 C) 98.2 F (36.8 C) 98.3 F (36.8 C)  TempSrc: Oral Oral    SpO2: 97% 97% 99% 100%  Weight:      Height:        Intake/Output Summary (Last 24 hours) at 07/12/2024 1332 Last data filed at 07/12/2024 1012 Gross per 24 hour  Intake 420 ml  Output 1650 ml  Net -1230 ml   Filed Weights   07/10/24 1425  Weight: 102.1 kg    Examination: General exam: Appears calm and comfortable, NAD  Respiratory system: No work of breathing, symmetric chest wall expansion Cardiovascular system: S1 & S2 heard, RRR.  Gastrointestinal system: Abdomen  is nondistended, soft and nontender.  Neuro: Alert and oriented.  Extremities: Symmetric, expected ROM Skin: No rashes, lesions Psychiatry: Demonstrates appropriate judgement and insight. Mood & affect appropriate for situation.   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  Can consider MRI of lumbar and thoracic if symptoms are persistent the patient does report he is not interested in further neurosurgery's even if given an option. - Continue with PT/OT.  Currently recommending rehab.  TOC consulted to assist in this arrangement  Syncopal episode - TTE without thrombus or vegetation.  LVEF 60 to 65%, grade 1 diastolic dysfunction - Has been on telemetry, no events - Orthostatics negative  Microcytic anemia - Iron studies within normal limits.  On B12 supplementation as below - Hemoglobin currently stable at 9.3 we will continue to monitor closely.  B12 deficiency - Level 266, high-dose oral replacement therapy has been ordered.  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  Confirmatory testing is consistently nonreactive  Hypokalemia - Continue to replace as needed  In utero stroke - Chronic right hemiparesis.  Hypertension - Continue home meds, titrate as needed  Hypothyroidism - Continue Synthroid   Lumbar degenerative disc disease Prior cervical spine fusion at C4-C5 Foraminal stenosis of cervical spine - Continue PT/OT.  Patient is not interested in any further neurosurgical  interventions.  DVT prophylaxis: Lovenox    Code Status: Full Code Disposition: Medically cleared for discharge pending SNF.  TOC consulted.  Assisting in arrangements.  Consultants:    Procedures:    Antimicrobials:  Anti-infectives (From admission, onward)    None       Data Reviewed: I have personally reviewed following labs and imaging studies CBC: Recent Labs  Lab 07/10/24 1427 07/11/24 1316 07/12/24 0509  WBC 6.8 7.2 8.0  NEUTROABS 5.2 5.1 4.9  HGB 9.0* 8.9* 9.3*  HCT 29.3* 29.4* 31.2*  MCV 71.3* 70.8* 73.1*  PLT 289 306 314   Basic Metabolic Panel: Recent Labs  Lab 07/10/24 1427 07/11/24 1316 07/12/24 0509  NA 141 136 137  K 3.7 3.5 3.4*  CL 98 100 100  CO2 31 26 27   GLUCOSE 136* 108* 100*  BUN 30* 25* 21  CREATININE 1.40* 0.99 1.00  CALCIUM 10.6* 9.3 9.2  MG  --   --  1.8  PHOS  --   --  3.1   GFR: Estimated Creatinine Clearance: 82.3 mL/min (by C-G formula based on SCr of 1 mg/dL). Liver Function Tests: Recent Labs  Lab 07/10/24 1427 07/11/24 1316 07/12/24 0509  AST 28 25 25   ALT 18 18 17   ALKPHOS 57 58 53  BILITOT 0.5 1.0 1.0  PROT 7.2 7.3 6.9  ALBUMIN 4.0 4.2 4.2   CBG: No results for input(s): GLUCAP in the last 168 hours.  No results found for this or any previous visit (from the past 240 hours).   Radiology Studies: MR CERVICAL SPINE WO CONTRAST Result Date: 07/12/2024 CLINICAL DATA:  Cervical radiculopathy, no red flags EXAM: MRI CERVICAL SPINE WITHOUT CONTRAST TECHNIQUE: Multiplanar, multisequence MR imaging of the cervical spine was performed. No intravenous contrast was administered. COMPARISON:  MRI cervical spine May 12, 2009 FINDINGS: Alignment: No substantial sagittal subluxation. Vertebrae: C3-C6 ACDF. No evidence of acute fracture or discitis/osteomyelitis. No suspicious bone lesion. Cord: Chronic small focus of T2 hyperintensity in the cord at C5-C6, compatible with myelomalacia. Posterior Fossa, vertebral arteries,  paraspinal tissues: Unremarkable. Disc levels: C2-C3: Right greater than left facet and uncovertebral hypertrophy resulting severe right foraminal stenosis, similar to priorl. Patent canal and left foramen C3-C4: ACDF. Uncovertebral hypertrophy with improved moderate right foraminal stenosis. Pain canal and left foramen. C4-C5: ACDF. Bilateral uncovertebral hypertrophy with similar moderate right and mild left foraminal stenosis. Patent canal. C5-C6: ACDF. Bilateral uncovertebral hypertrophy. Similar moderate right foraminal stenosis. Patent canal and left foramen. C6-C7: Bilateral facet uncovertebral hypertrophy, left greater than right. Progressive moderate to severe left foraminal stenosis. Right foramen is patent. Patent canal. C7-T1: Right greater than left facet and uncovertebral hypertrophy. Resulting progressive moderate right foraminal stenosis. IMPRESSION: 1. At C2-C3, similar severe right foraminal stenosis. 2. At C6-C7, progressive moderate to severe left foraminal stenosis. 3. At C7-T1, progressive moderate right foraminal stenosis. 4. Prior C3-C6 ACDF without progressive or high-grade stenosis at these levels. 5. 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 Electronically Signed   By: Gilmore GORMAN Molt M.D.   On: 07/12/2024 03:57   ECHOCARDIOGRAM COMPLETE Result Date: 07/11/2024    ECHOCARDIOGRAM REPORT   Patient Name:   TOPHER BUENAVENTURA  Date of Exam: 07/11/2024 Medical Rec #:  [id number]      Height:       70.0 in Accession #:    [id number]     Weight:       225.0 lb Date of Birth:  12-02-1951      BSA:          2.194 m Patient Age:    72 years       BP:           147/76 mmHg Patient Gender: M              HR:           86 bpm. Exam Location:  [facility] Procedure: 2D Echo, Cardiac Doppler, Color Doppler and Saline Contrast Bubble            Study (Both Spectral and Color Flow Doppler were utilized during            procedure). Indications:     Stroke I63.9  History:         Patient has prior history of Echocardiogram  examinations, most                  recent 11/05/2019. Stroke; Risk Factors:Hypertension.  Sonographer:     Christopher Furnace Referring Phys:  [id number] DELAYNE LULLA SOLIAN Diagnosing Phys: Evalene Lunger MD IMPRESSIONS  1. Left ventricular ejection fraction, by estimation, is 60 to 65%. The left ventricle has normal function. The left ventricle has no regional wall motion abnormalities. Left ventricular diastolic parameters are consistent with Grade I diastolic dysfunction (impaired relaxation).  2. Right ventricular systolic function is normal. The right ventricular size is normal. There is normal pulmonary artery systolic pressure. The estimated right ventricular systolic pressure is 21.6 mmHg.  3. The mitral valve is normal in structure. No evidence of mitral valve regurgitation. No evidence of mitral stenosis. Moderate mitral annular calcification.  4. The aortic valve is calcified. There is severe calcifcation of the aortic valve. Aortic valve regurgitation is not visualized. Moderate aortic valve stenosis. Aortic valve area, by VTI measures 1.69 cm. Aortic valve mean gradient measures 20.0 mmHg.  Aortic valve Vmax measures 3.05 m/s.  5. The inferior vena cava is normal in size with greater than 50% respiratory variability, suggesting right atrial pressure of 3 mmHg.  6. Agitated saline contrast bubble study was negative, with no evidence of any interatrial shunt. FINDINGS  Left Ventricle: Left ventricular ejection fraction, by estimation, is 60 to 65%. The left ventricle has normal function. The left ventricle has no regional wall motion abnormalities. Strain was performed and the global longitudinal strain is indeterminate. The left ventricular internal cavity size was normal in size. There is no left ventricular hypertrophy. Left ventricular diastolic parameters are consistent with Grade I diastolic dysfunction (impaired relaxation). Right Ventricle: The right ventricular size is normal. No increase in right ventricular  wall thickness. Right ventricular systolic function is normal. There is normal pulmonary artery systolic pressure. The tricuspid regurgitant velocity is 2.04 m/s, and  with an assumed right atrial pressure of 5 mmHg, the estimated right ventricular systolic pressure is 21.6 mmHg. Left Atrium: Left atrial size was normal in size. Right Atrium: Right atrial size was normal in size. Pericardium: There is no evidence of pericardial effusion. Mitral Valve: The mitral valve is normal in structure. Moderate mitral annular calcification. No evidence of mitral valve regurgitation. No evidence of mitral valve stenosis. MV peak gradient, 7.7 mmHg. The mean mitral valve  gradient is 4.0 mmHg. Tricuspid Valve: The tricuspid valve is normal in structure. Tricuspid valve regurgitation is not demonstrated. No evidence of tricuspid stenosis. Aortic Valve: The aortic valve is calcified. There is severe calcifcation of the aortic valve. Aortic valve regurgitation is not visualized. Moderate aortic stenosis is present. Aortic valve mean gradient measures 20.0 mmHg. Aortic valve peak gradient measures 37.3 mmHg. Aortic valve area, by VTI measures 1.69 cm. Pulmonic Valve: The pulmonic valve was normal in structure. Pulmonic valve regurgitation is not visualized. No evidence of pulmonic stenosis. Aorta: The aortic root is normal in size and structure. Venous: The inferior vena cava is normal in size with greater than 50% respiratory variability, suggesting right atrial pressure of 3 mmHg. IAS/Shunts: No atrial level shunt detected by color flow Doppler. Agitated saline contrast was given intravenously to evaluate for intracardiac shunting. Agitated saline contrast bubble study was negative, with no evidence of any interatrial shunt. There  is no evidence of a patent foramen ovale. There is no evidence of an atrial septal defect. Additional Comments: 3D was performed not requiring image post processing on an independent workstation and was  indeterminate.  LEFT VENTRICLE PLAX 2D LVIDd:         4.40 cm   Diastology LVIDs:         2.70 cm   LV e' medial:    5.22 cm/s LV PW:         1.40 cm   LV E/e' medial:  19.0 LV IVS:        1.60 cm   LV e' lateral:   5.98 cm/s LVOT diam:     2.30 cm   LV E/e' lateral: 16.6 LV SV:         80 LV SV Index:   36 LVOT Area:     4.15 cm  RIGHT VENTRICLE RV Basal diam:  5.00 cm RV Mid diam:    3.80 cm LEFT ATRIUM             Index        RIGHT ATRIUM          Index LA diam:        2.30 cm 1.05 cm/m   RA Area:     9.74 cm LA Vol (A2C):   49.2 ml 22.42 ml/m  RA Volume:   22.50 ml 10.25 ml/m LA Vol (A4C):   26.9 ml 12.26 ml/m LA Biplane Vol: 37.0 ml 16.86 ml/m  AORTIC VALVE AV Area (Vmax):    1.29 cm AV Area (Vmean):   1.21 cm AV Area (VTI):     1.69 cm AV Vmax:           305.33 cm/s AV Vmean:          206.500 cm/s AV VTI:            0.472 m AV Peak Grad:      37.3 mmHg AV Mean Grad:      20.0 mmHg LVOT Vmax:         95.00 cm/s LVOT Vmean:        60.100 cm/s LVOT VTI:          0.192 m LVOT/AV VTI ratio: 0.41  AORTA Ao Root diam: 3.30 cm MITRAL VALVE                TRICUSPID VALVE MV Area (PHT): 4.29 cm     TR Peak grad:   16.6 mmHg MV Area VTI:   2.61 cm  TR Vmax:        204.00 cm/s MV Peak grad:  7.7 mmHg MV Mean grad:  4.0 mmHg     SHUNTS MV Vmax:       1.39 m/s     Systemic VTI:  0.19 m MV Vmean:      92.1 cm/s    Systemic Diam: 2.30 cm MV Decel Time: 177 msec MV E velocity: 99.00 cm/s MV A velocity: 138.00 cm/s MV E/A ratio:  0.72 Evalene Lunger MD Electronically signed by Evalene Lunger MD Signature Date/Time: 07/11/2024/6:59:57 PM    Final    MR BRAIN WO CONTRAST Result Date: 07/11/2024 CLINICAL DATA:  ataxia, ?L-sided weakness EXAM: MRI HEAD WITHOUT CONTRAST TECHNIQUE: Multiplanar, multiecho pulse sequences of the brain and surrounding structures were obtained without intravenous contrast. COMPARISON:  MRI head November 05, 2019. FINDINGS: Brain: No acute infarction, hemorrhage, hydrocephalus, extra-axial  collection or mass lesion. Left cerebral hemisphere volume loss with ex vacuo ventricular enlargement, unchanged. Vascular: Normal flow voids. Skull and upper cervical spine: Normal marrow signal. Sinuses/Orbits: Negative. IMPRESSION: 1. No evidence of acute intracranial abnormality. 2. Left cerebral hemisphere volume loss with ex vacuo ventricular enlargement, unchanged. Electronically Signed   By: Gilmore GORMAN Molt M.D.   On: 07/11/2024 02:29   CT HEAD WO CONTRAST Result Date: 07/10/2024 EXAM: CT HEAD WITHOUT CONTRAST 07/10/2024 02:56:38 PM TECHNIQUE: CT of the head was performed without the administration of intravenous contrast. Automated exposure control, iterative reconstruction, and/or weight based adjustment of the mA/kV was utilized to reduce the radiation dose to as low as reasonably achievable. COMPARISON: CT of the head dated 12/22/2021. CLINICAL HISTORY: Neuro deficit, acute, stroke suspected. Pt c/o increasing weakness in L arm and L leg x2-3 weeks. Denies pain. Pt reports Hx of prenatal stroke w/ deficit in R hand. FINDINGS: BRAIN AND VENTRICLES: No acute hemorrhage. Gray-white differentiation is preserved. There is pronounced left cerebral and moderate left cerebellar volume loss with marked chronic enlargement of the left lateral ventricle. The right ventricle is also moderately prominent. There is mild-to-moderate periventricular white matter disease. ORBITS: The patient is status post bilateral lens replacement and scleral banding. SINUSES: There is mild mucosal disease within the maxillary sinuses. SOFT TISSUES AND SKULL: No acute soft tissue abnormality. No skull fracture. Calcifications are noted within the carotid siphons bilaterally. IMPRESSION: 1. No acute intracranial abnormality. 2. Pronounced left cerebral and moderate left cerebellar volume loss with marked chronic enlargement of the left lateral ventricle and moderately prominent right ventricle. 3. Mild-to-moderate periventricular  white matter disease. Electronically signed by: timothy berrigan 07/10/2024 03:01 PM EDT RP Workstation: [id number]    Scheduled Meds:  aspirin  EC  81 mg Oral Daily   carbamazepine   400 mg Oral TID   vitamin B-12  2,000 mcg Oral Daily   enoxaparin  (LOVENOX ) injection  50 mg Subcutaneous Q24H   gabapentin   300 mg Oral q morning   And   gabapentin   600 mg Oral q1600   And   gabapentin   900 mg Oral QHS   levothyroxine   175 mcg Oral Q0600   tamsulosin   0.4 mg Oral Daily   Continuous Infusions:   LOS: 0 days  MDM: Patient is high risk for one or more organ failure.  They necessitate ongoing hospitalization for continued IV therapies and subsequent lab monitoring. Total time spent interpreting labs and vitals, reviewing the medical record, coordinating care amongst consultants and care team members, directly assessing and discussing care with the patient and/or family: 55 min  Tonyia Marschall  Hayward Rylander, DO [facility]  To contact the attending physician between 7A-7P please use Epic Chat. To contact the covering physician during after hours 7P-7A, please review Amion.  07/12/2024, 1:32 PM   *This document has been created with the assistance of dictation software. Please excuse typographical errors. *

## 2024-07-12 NOTE — Plan of Care (Signed)

## 2024-07-12 NOTE — Progress Notes (Signed)
 Physical Therapy Treatment Patient Details Name: Anthony Evans MRN: [id number] DOB: 1951/07/27 Today's Date: 07/12/2024   History of Present Illness Pt is a 73 y.o. male with medical history significant of In utero stroke with right hemiparesis, hypertension, hypothyroidism, lumbar degenerative disc disease, BPH, remote history of seizures on multiple AEDs, followed by neurology, being admitted with a several week history of frequent falls and more recently, unsteady gait, new left-sided tingling, leaning to the left, with a presyncopal episode on the day of arrival as he was making his way to the car. MD assessment includes: unsteady gait, presyncope, seizure disorder, and HTN.    PT Comments  Pt was sitting in recliner upon arrival. He is A and O x 4. Agrees to session and remains cooperative throughout. He was able to stand form recliner to RW and tolerate ambulation out to [employer] and return. Author did provide chair follow however not uses until pt was back in room. Pt was somewhat self limiting. He does fully participate but only willing to perform gait trial 1 x. Did discuss post acute DC needs. DC recs remain appropriate to maximize his independence and safety with all ADLs     If plan is discharge home, recommend the following: A little help with walking and/or transfers;A little help with bathing/dressing/bathroom;Assistance with cooking/housework;Assistance with feeding;Assist for transportation     Equipment Recommendations  Rolling walker (2 wheels)       Precautions / Restrictions Precautions Precautions: Fall Recall of Precautions/Restrictions: Intact Restrictions Weight Bearing Restrictions Per Provider Order: No     Mobility  Bed Mobility  General bed mobility comments: Pt was in recliner pre/post session.    Transfers Overall transfer level: Needs assistance Equipment used: Rolling walker (2 wheels) Transfers: Sit to/from Stand Sit to Stand: Contact guard assist   General transfer comment: CGA for safety to stand form recliner to RW. Poor eccentric control with stand to sit    Ambulation/Gait Ambulation/Gait assistance: Contact guard assist Gait Distance (Feet): 50 Feet Assistive device: Rolling walker (2 wheels) Gait Pattern/deviations: Step-to pattern, Step-through pattern, Decreased stance time - right, Trunk flexed Gait velocity: decreased  General Gait Details: Pt was able to ambulate to RN desk and return. Chair follow for additional safety. pt self limiting and endorses fatigue and unwillingness to attempt further gait.    Balance Overall balance assessment: Needs assistance Sitting-balance support: Feet supported Sitting balance-Leahy Scale: Good     Standing balance support: Bilateral upper extremity supported, During functional activity, Reliant on assistive device for balance Standing balance-Leahy Scale: Fair Standing balance comment: reliant on RW for dynamic gait activity    Communication Communication Communication: No apparent difficulties  Cognition Arousal: Alert Behavior During Therapy: WFL for tasks assessed/performed   PT - Cognitive impairments: No apparent impairments    PT - Cognition Comments: Pt is A and O x 4. Agreeable to session and cooperative throughout. Following commands: Intact Following commands impaired: Follows one step commands with increased time    Cueing Cueing Techniques: Verbal cues, Tactile cues         Pertinent Vitals/Pain Pain Assessment Pain Assessment: 0-10 Pain Score: 4  Pain Location: chronic back pain Pain Descriptors / Indicators: Discomfort, Aching Pain Intervention(s): Limited activity within patient's tolerance, Monitored during session, Premedicated before session, Repositioned     PT Goals (current goals can now be found in the care plan section) Acute Rehab PT Goals Patient Stated Goal: To get stronger Progress towards PT goals: Progressing toward goals  Frequency    Min 2X/week       AM-PAC PT 6 Clicks Mobility   Outcome Measure  Help needed turning from your back to your side while in a flat bed without using bedrails?: A Little Help needed moving from lying on your back to sitting on the side of a flat bed without using bedrails?: A Little Help needed moving to and from a bed to a chair (including a wheelchair)?: A Little Help needed standing up from a chair using your arms (e.g., wheelchair or bedside chair)?: A Little Help needed to walk in hospital room?: A Little Help needed climbing 3-5 steps with a railing? : A Lot 6 Click Score: 17    End of Session Equipment Utilized During Treatment: Gait belt Activity Tolerance: Patient tolerated treatment well Patient left: in chair;with call bell/phone within reach;with family/visitor present Nurse Communication: Mobility status PT Visit Diagnosis: Unsteadiness on feet (R26.81);History of falling (Z91.81);Difficulty in walking, not elsewhere classified (R26.2);Muscle weakness (generalized) (M62.81)     Time: 8944-8879 PT Time Calculation (min) (ACUTE ONLY): 25 min  Charges:    $Gait Training: 8-22 mins $Therapeutic Activity: 8-22 mins PT General Charges $$ ACUTE PT VISIT: 1 Visit                     Rankin Essex PTA 07/12/24, 1:38 PM

## 2024-07-12 NOTE — Progress Notes (Signed)
 Mobility Specialist - Progress Note   07/12/24 1613  Mobility  Activity Ambulated with assistance  Level of Assistance Standby assist, set-up cues, supervision of patient - no hands on  Assistive Device Front wheel walker  Distance Ambulated (ft) 80 ft  Activity Response Tolerated well  Mobility visit 1 Mobility  Mobility Specialist Start Time (ACUTE ONLY) 1538  Mobility Specialist Stop Time (ACUTE ONLY) 1545  Mobility Specialist Time Calculation (min) (ACUTE ONLY) 7 min   Pt standing in the bathroom w/ staff upon entry, utilizing RA. Pt agreeable to amb within the hallway. Pt amb ~ 80 ft (one hall) CGA-MinG-- close guard. Pt returned to the room, expressed fatigue, denies pain. Pt left supine with RN present at bedside.  America Silvan Mobility Specialist 07/12/24 4:19 PM

## 2024-07-12 NOTE — NC FL2 (Signed)
 [place]  MEDICAID FL2 LEVEL OF CARE FORM     IDENTIFICATION  Patient Name: Anthony Evans Birthdate: [birth year] Sex: male Admission Date (Current Location): 07/10/2024  [facility] and [place] Number:  Chiropodist and Address:         Provider Number: [id number]  Attending Physician Name and Address:  Leesa Kast, DO  Relative Name and Phone Number:       Current Level of Care: Hospital Recommended Level of Care: Skilled Nursing Facility Prior Approval Number:    Date Approved/Denied:   PASRR Number: [id number]  Discharge Plan: SNF    Current Diagnoses: Patient Active Problem List   Diagnosis Date Noted   Generalized weakness 07/11/2024   Postural dizziness with presyncope 07/11/2024   Unsteady gait 07/10/2024   Cataract cortical, senile 06/22/2023   Psoriasis 06/22/2023   Stroke (HCC) 06/22/2023   Personal history of other malignant neoplasm of skin 08/19/2021   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) 03/19/2021   Difficulty walking 01/14/2021   Drowsiness 01/14/2021   Falls frequently 01/14/2021   Anemia 11/08/2019   BMI 30.0-30.9,adult 11/08/2019   Hypertriglyceridemia 11/08/2019   Right leg weakness 11/08/2019   Syncope 11/05/2019   High risk medication use 01/31/2019   Hypothyroidism, acquired 11/08/2018   C2 cervical fracture (HCC) 09/14/2018   Hypertension 03/08/2018   GERD (gastroesophageal reflux disease) 03/08/2018   Thyroid disease 03/08/2018   Degenerative disc disease 03/08/2018   Psoriatic arthritis (HCC) 03/08/2018   Back pain 03/08/2018   Thalassemia minor 03/08/2018   Seizure disorder (HCC) 03/08/2018   H/O cataract 03/08/2018   Seasonal allergies 03/08/2018   History of seizure 04/23/2016   Hx of adenomatous colonic polyps 04/23/2016   Cervical radiculitis 03/13/2015   Inflammatory arthritis 10/01/2014    Orientation RESPIRATION BLADDER Height & Weight      Self, Time, Situation, Place  Normal Continent Weight: 102.1 kg Height:  5' 10 (177.8 cm)  BEHAVIORAL SYMPTOMS/MOOD NEUROLOGICAL BOWEL NUTRITION STATUS      Continent Diet (Heart Healthy)  AMBULATORY STATUS COMMUNICATION OF NEEDS Skin   Limited Assist Verbally Normal                       Personal Care Assistance Level of Assistance              Functional Limitations Info             SPECIAL CARE FACTORS FREQUENCY  PT (By licensed PT), OT (By licensed OT)                    Contractures Contractures Info: Not present    Additional Factors Info  Code Status, Allergies Code Status Info: Full Allergies Info: Etanercept, Etodolac, Rubbing Alcohol (Isopropyl Alcohol)           Current Medications (07/12/2024):  This is the current hospital active medication list Current Facility-Administered Medications  Medication Dose Route Frequency Provider Last Rate Last Admin   acetaminophen  (TYLENOL ) tablet 650 mg  650 mg Oral Q4H PRN Duncan, Hazel V, MD       Or   acetaminophen  (TYLENOL ) 160 MG/5ML solution 650 mg  650 mg Per Tube Q4H PRN Duncan, Hazel V, MD       Or   acetaminophen  (TYLENOL ) suppository 650 mg  650 mg Rectal Q4H PRN Cleatus Delayne GAILS, MD       aspirin  EC tablet 81 mg  81 mg  Oral Daily Cleatus Delayne GAILS, MD   81 mg at 07/12/24 9072   carbamazepine  (TEGRETOL ) tablet 400 mg  400 mg Oral TID Duncan, Hazel V, MD   400 mg at 07/12/24 9071   cyanocobalamin  (VITAMIN B12) tablet 2,000 mcg  2,000 mcg Oral Daily Lindzen, Eric, MD   2,000 mcg at 07/12/24 0927   enoxaparin  (LOVENOX ) injection 50 mg  50 mg Subcutaneous Q24H Duncan, Hazel V, MD   50 mg at 07/12/24 9071   gabapentin  (NEURONTIN ) capsule 300 mg  300 mg Oral q morning Dail Rankin RAMAN, RPH   300 mg at 07/12/24 9072   And   gabapentin  (NEURONTIN ) capsule 600 mg  600 mg Oral q1600 Dail Rankin RAMAN, RPH   600 mg at 07/11/24 1525   And   gabapentin  (NEURONTIN ) capsule 900 mg  900 mg Oral QHS Belue, Nathan S,  RPH   900 mg at 07/11/24 2219   HYDROcodone -acetaminophen  (NORCO/VICODIN) 5-325 MG per tablet 1 tablet  1 tablet Oral Q6H PRN Cleatus Delayne GAILS, MD       levothyroxine  (SYNTHROID ) tablet 175 mcg  175 mcg Oral Q0600 Cleatus Delayne GAILS, MD   175 mcg at 07/12/24 0517   tamsulosin  (FLOMAX ) capsule 0.4 mg  0.4 mg Oral Daily Dezii, Alexandra, DO   0.4 mg at 07/12/24 9071     Discharge Medications: Please see discharge summary for a list of discharge medications.  Relevant Imaging Results:  Relevant Lab Results:   Additional Information ss [id number]  Corean ONEIDA Haddock, RN

## 2024-07-12 NOTE — TOC Initial Note (Signed)
 Transition of Care [facility]) - Initial/Assessment Note    Patient Details  Name: Anthony Evans MRN: [id number] Date of Birth: January 28, 1951  Transition of Care [facility]) CM/SW Contact:    Corean ONEIDA Haddock, RN Phone Number: 07/12/2024, 2:55 PM  Clinical Narrative:                  Admitted for: Gait disturbance Admitted from: home alone PCP: Hande  Current home health/prior home health/DME: RW cane  Therapy recommend SNF Patient in agreement Bed offers presented.  Patient selects [facility].  Accepted in HUB and notified Tammy at [facility].  Requested [facility] with IP Care Management to start auth.  Patient confirms he can arrange transport at dc         Patient Goals and CMS Choice            Expected Discharge Plan and Services                                              Prior Living Arrangements/Services                       Activities of Daily Living   ADL Screening (condition at time of admission) Independently performs ADLs?: Yes (appropriate for developmental age) Is the patient deaf or have difficulty hearing?: No Does the patient have difficulty seeing, even when wearing glasses/contacts?: No Does the patient have difficulty concentrating, remembering, or making decisions?: No  Permission Sought/Granted                  Emotional Assessment              Admission diagnosis:  Ataxia [R27.0] Weakness [R53.1] Unsteady gait [R26.81] AKI (acute kidney injury) (HCC) [N17.9] Patient Active Problem List   Diagnosis Date Noted   Generalized weakness 07/11/2024   Postural dizziness with presyncope 07/11/2024   Unsteady gait 07/10/2024   Cataract cortical, senile 06/22/2023   Psoriasis 06/22/2023   Stroke (HCC) 06/22/2023   Personal history of other malignant neoplasm of skin 08/19/2021   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) 03/19/2021   Difficulty walking  01/14/2021   Drowsiness 01/14/2021   Falls frequently 01/14/2021   Anemia 11/08/2019   BMI 30.0-30.9,adult 11/08/2019   Hypertriglyceridemia 11/08/2019   Right leg weakness 11/08/2019   Syncope 11/05/2019   High risk medication use 01/31/2019   Hypothyroidism, acquired 11/08/2018   C2 cervical fracture (HCC) 09/14/2018   Hypertension 03/08/2018   GERD (gastroesophageal reflux disease) 03/08/2018   Thyroid disease 03/08/2018   Degenerative disc disease 03/08/2018   Psoriatic arthritis (HCC) 03/08/2018   Back pain 03/08/2018   Thalassemia minor 03/08/2018   Seizure disorder (HCC) 03/08/2018   H/O cataract 03/08/2018   Seasonal allergies 03/08/2018   History of seizure 04/23/2016   Hx of adenomatous colonic polyps 04/23/2016   Cervical radiculitis 03/13/2015   Inflammatory arthritis 10/01/2014   PCP:  Sadie Manna, MD Pharmacy:   [facility] - [place], [place] - [address] [place] [address] Phone: [phone] Fax: [phone]  [facility] - [place], [place] - [address] [place] [address] Phone: [phone] Fax: [phone]     Social Drivers of Health (SDOH) Social History: SDOH Screenings  Food Insecurity: No Food Insecurity (07/11/2024)  Housing: Low Risk  (07/11/2024)  Transportation Needs: No Transportation Needs (07/11/2024)  Utilities: Not At Risk (07/11/2024)  Financial Resource Strain: Low Risk  (04/18/2024)   Received from [facility]  Social Connections: Moderately Isolated (07/11/2024)  Tobacco Use: Low Risk  (07/10/2024)   SDOH Interventions:     Readmission Risk Interventions     No data to display

## 2024-07-13 DIAGNOSIS — E039 Hypothyroidism, unspecified: Secondary | ICD-10-CM

## 2024-07-13 DIAGNOSIS — R42 Dizziness and giddiness: Secondary | ICD-10-CM | POA: Diagnosis not present

## 2024-07-13 DIAGNOSIS — R531 Weakness: Secondary | ICD-10-CM | POA: Diagnosis not present

## 2024-07-13 DIAGNOSIS — R55 Syncope and collapse: Secondary | ICD-10-CM

## 2024-07-13 DIAGNOSIS — I69351 Hemiplegia and hemiparesis following cerebral infarction affecting right dominant side: Secondary | ICD-10-CM

## 2024-07-13 DIAGNOSIS — I10 Essential (primary) hypertension: Secondary | ICD-10-CM

## 2024-07-13 DIAGNOSIS — R2681 Unsteadiness on feet: Secondary | ICD-10-CM | POA: Diagnosis not present

## 2024-07-13 DIAGNOSIS — G40909 Epilepsy, unspecified, not intractable, without status epilepticus: Secondary | ICD-10-CM

## 2024-07-13 LAB — COMPREHENSIVE METABOLIC PANEL WITH GFR
ALT: 16 U/L (ref 0–44)
AST: 22 U/L (ref 15–41)
Albumin: 3.7 g/dL (ref 3.5–5.0)
Alkaline Phosphatase: 53 U/L (ref 38–126)
Anion gap: 9 (ref 5–15)
BUN: 19 mg/dL (ref 8–23)
CO2: 27 mmol/L (ref 22–32)
Calcium: 8.8 mg/dL — ABNORMAL LOW (ref 8.9–10.3)
Chloride: 103 mmol/L (ref 98–111)
Creatinine, Ser: 0.94 mg/dL (ref 0.61–1.24)
GFR, Estimated: >60 mL/min (ref >60)
Glucose, Bld: 96 mg/dL (ref 70–99)
Potassium: 3.4 mmol/L — ABNORMAL LOW (ref 3.5–5.1)
Sodium: 139 mmol/L (ref 135–145)
Total Bilirubin: 0.7 mg/dL (ref 0.0–1.2)
Total Protein: 6.6 g/dL (ref 6.5–8.1)

## 2024-07-13 LAB — CBC WITH DIFFERENTIAL/PLATELET
Abs Immature Granulocytes: 0.03 K/uL (ref 0.00–0.07)
Basophils Absolute: 0 K/uL (ref 0.0–0.1)
Basophils Relative: 0 %
Eosinophils Absolute: 0.4 K/uL (ref 0.0–0.5)
Eosinophils Relative: 5 %
HCT: 26.7 % — ABNORMAL LOW (ref 39.0–52.0)
Hemoglobin: 8.1 g/dL — ABNORMAL LOW (ref 13.0–17.0)
Immature Granulocytes: 0 %
Lymphocytes Relative: 16 %
Lymphs Abs: 1.1 K/uL (ref 0.7–4.0)
MCH: 21.7 pg — ABNORMAL LOW (ref 26.0–34.0)
MCHC: 30.3 g/dL (ref 30.0–36.0)
MCV: 71.4 fL — ABNORMAL LOW (ref 80.0–100.0)
Monocytes Absolute: 0.8 K/uL (ref 0.1–1.0)
Monocytes Relative: 12 %
Neutro Abs: 4.7 K/uL (ref 1.7–7.7)
Neutrophils Relative %: 67 %
Platelets: 290 K/uL (ref 150–400)
RBC: 3.74 MIL/uL — ABNORMAL LOW (ref 4.22–5.81)
RDW: 20.1 % — ABNORMAL HIGH (ref 11.5–15.5)
WBC: 7.1 K/uL (ref 4.0–10.5)
nRBC: 0 % (ref 0.0–0.2)

## 2024-07-13 LAB — MAGNESIUM: Magnesium: 1.7 mg/dL (ref 1.7–2.4)

## 2024-07-13 LAB — HIV-1/HIV-2 QUALITATIVE RNA
Final Interpretation: NEGATIVE
HIV-1 RNA, Qualitative: NONREACTIVE
HIV-2 RNA, Qualitative: NONREACTIVE

## 2024-07-13 LAB — VITAMIN B1: Vitamin B1 (Thiamine): 112.8 nmol/L (ref 66.5–200.0)

## 2024-07-13 LAB — HIV-1/2 AB - DIFFERENTIATION
HIV 1 Ab: NONREACTIVE
HIV 2 Ab: NONREACTIVE
Note: NEGATIVE

## 2024-07-13 LAB — PHOSPHORUS: Phosphorus: 2.5 mg/dL (ref 2.5–4.6)

## 2024-07-13 MED ORDER — POTASSIUM CHLORIDE CRYS ER 20 MEQ PO TBCR
40.0000 meq | EXTENDED_RELEASE_TABLET | Freq: Once | ORAL | Status: AC
  Administered 2024-07-13: 40 meq via ORAL
  Filled 2024-07-13: qty 2

## 2024-07-13 MED ORDER — MAGNESIUM SULFATE 2 GM/50ML IV SOLN
2.0000 g | Freq: Once | INTRAVENOUS | Status: AC
  Administered 2024-07-13: 2 g via INTRAVENOUS
  Filled 2024-07-13: qty 50

## 2024-07-13 NOTE — Plan of Care (Signed)

## 2024-07-13 NOTE — Plan of Care (Signed)
  Problem: Education: Goal: Knowledge of General Education information will improve Description: Including pain rating scale, medication(s)/side effects and non-pharmacologic comfort measures Outcome: Adequate for Discharge   Problem: Health Behavior/Discharge Planning: Goal: Ability to manage health-related needs will improve Outcome: Adequate for Discharge   Problem: Clinical Measurements: Goal: Ability to maintain clinical measurements within normal limits will improve Outcome: Adequate for Discharge Goal: Will remain free from infection Outcome: Adequate for Discharge Goal: Diagnostic test results will improve Outcome: Adequate for Discharge Goal: Respiratory complications will improve Outcome: Adequate for Discharge Goal: Cardiovascular complication will be avoided Outcome: Adequate for Discharge   Problem: Activity: Goal: Risk for activity intolerance will decrease Outcome: Adequate for Discharge   Problem: Nutrition: Goal: Adequate nutrition will be maintained Outcome: Adequate for Discharge   Problem: Coping: Goal: Level of anxiety will decrease Outcome: Adequate for Discharge   Problem: Elimination: Goal: Will not experience complications related to bowel motility Outcome: Adequate for Discharge Goal: Will not experience complications related to urinary retention Outcome: Adequate for Discharge   Problem: Pain Managment: Goal: General experience of comfort will improve and/or be controlled Outcome: Adequate for Discharge   Problem: Safety: Goal: Ability to remain free from injury will improve Outcome: Adequate for Discharge   Problem: Skin Integrity: Goal: Risk for impaired skin integrity will decrease Outcome: Adequate for Discharge   Problem: Education: Goal: Knowledge of disease or condition will improve Outcome: Adequate for Discharge Goal: Knowledge of secondary prevention will improve (MUST DOCUMENT ALL) Outcome: Adequate for Discharge Goal:  Knowledge of patient specific risk factors will improve (DELETE if not current risk factor) Outcome: Adequate for Discharge   Problem: Ischemic Stroke/TIA Tissue Perfusion: Goal: Complications of ischemic stroke/TIA will be minimized Outcome: Adequate for Discharge   Problem: Coping: Goal: Will verbalize positive feelings about self Outcome: Adequate for Discharge Goal: Will identify appropriate support needs Outcome: Adequate for Discharge   Problem: Health Behavior/Discharge Planning: Goal: Ability to manage health-related needs will improve Outcome: Adequate for Discharge Goal: Goals will be collaboratively established with patient/family Outcome: Adequate for Discharge   Problem: Self-Care: Goal: Ability to participate in self-care as condition permits will improve Outcome: Adequate for Discharge Goal: Verbalization of feelings and concerns over difficulty with self-care will improve Outcome: Adequate for Discharge Goal: Ability to communicate needs accurately will improve Outcome: Adequate for Discharge   Problem: Nutrition: Goal: Risk of aspiration will decrease Outcome: Adequate for Discharge Goal: Dietary intake will improve Outcome: Adequate for Discharge   Problem: Acute Rehab OT Goals (only OT should resolve) Goal: Pt. Will Perform Grooming Outcome: Adequate for Discharge Goal: Pt. Will Perform Lower Body Dressing Outcome: Adequate for Discharge Goal: Pt. Will Transfer To Toilet Outcome: Adequate for Discharge Goal: Pt. Will Perform Toileting-Clothing Manipulation Outcome: Adequate for Discharge   Problem: Acute Rehab PT Goals(only PT should resolve) Goal: Pt Will Go Sit To Supine/Side Outcome: Adequate for Discharge Goal: Pt Will Transfer Bed To Chair/Chair To Bed Outcome: Adequate for Discharge Goal: Pt Will Ambulate Outcome: Adequate for Discharge

## 2024-07-13 NOTE — TOC Transition Note (Signed)
 Transition of Care [facility]) - Discharge Note   Patient Details  Name: Anthony Evans MRN: [id number] Date of Birth: [birth year]  Transition [facility]) CM/SW Contact:  Corean ONEIDA Haddock, RN Phone Number: 07/13/2024, 12:01 PM   Clinical Narrative:     Insurance auth received for [facility]   Patient will [facility] to: [facility] Anticipated DC date: 07/13/24  ] Transport by: Notified by RN that family will be transporting   Per MD patient ready for DC to . RN, patient, , and facility notified of DC. Discharge Summary sent to facility. RN given number for report. DC packet on chart.  TOC signing off.          Patient Goals and CMS Choice            Discharge Placement                       Discharge Plan and Services Additional resources added to the After Visit Summary for                                       Social Drivers of Health (SDOH) Interventions SDOH Screenings   Food Insecurity: No Food Insecurity (07/11/2024)  Housing: Low Risk  (07/11/2024)  Transportation Needs: No Transportation Needs (07/11/2024)  Utilities: Not At Risk (07/11/2024)  Financial Resource Strain: Low Risk  (04/18/2024)   Received from [facility]  Social Connections: Moderately Isolated (07/11/2024)  Tobacco Use: Low Risk  (07/10/2024)     Readmission Risk Interventions     No data to display

## 2024-07-13 NOTE — Progress Notes (Signed)
 Occupational Therapy Treatment Patient Details Name: Anthony Evans MRN: [id number] DOB: [birth year] Today's Date: 07/13/2024   History of present illness Pt is a 73 y.o. male with medical history significant of In utero stroke with right hemiparesis, hypertension, hypothyroidism, lumbar degenerative disc disease, BPH, remote history of seizures on multiple AEDs, followed by neurology, being admitted with a several week history of frequent falls and more recently, unsteady gait, new left-sided tingling, leaning to the left, with a presyncopal episode on the day of arrival as he was making his way to the car. MD assessment includes: unsteady gait, presyncope, seizure disorder, and HTN.   OT comments  Chart reviewed to date, pt greeted in bathroom, agreeable to OT tx session targeting improving functional activity tolerance in prep for improved ADL performance. Pt continues to make progress towards goals, requires multi modal cues for safe amb/transfer attempts. Pt is left in bedside chiar, all needs met, nurse present. OT will continue to follow.       If plan is discharge home, recommend the following:  A little help with walking and/or transfers;Assistance with cooking/housework;Assist for transportation;Direct supervision/assist for medications management;Direct supervision/assist for financial management;Help with stairs or ramp for entrance;Supervision due to cognitive status;A little help with bathing/dressing/bathroom   Equipment Recommendations  None recommended by OT    Recommendations for Other Services      Precautions / Restrictions Precautions Precautions: Fall Recall of Precautions/Restrictions: Intact Restrictions Weight Bearing Restrictions Per Provider Order: No       Mobility Bed Mobility               General bed mobility comments: NT on toilet, in recliner pre/post session    Transfers Overall transfer level: Needs assistance Equipment used: Rolling walker  (2 wheels) Transfers: Sit to/from Stand Sit to Stand: Contact guard assist                 Balance Overall balance assessment: Needs assistance Sitting-balance support: Feet supported Sitting balance-Leahy Scale: Good     Standing balance support: Bilateral upper extremity supported, During functional activity, Reliant on assistive device for balance Standing balance-Leahy Scale: Fair                             ADL either performed or assessed with clinical judgement   ADL Overall ADL's : Needs assistance/impaired                         Toilet Transfer: Contact guard assist;Rolling walker (2 wheels);Regular Toilet   Toileting- Clothing Manipulation and Hygiene: Supervision/safety;Sitting/lateral lean       Functional mobility during ADLs: Contact guard assist;Cueing for safety;Cueing for sequencing (approx 50' with RW, frequent cueing for safety/technique/pacing)      Extremity/Trunk Assessment              Vision       Perception     Praxis     Communication Communication Communication: No apparent difficulties   Cognition Arousal: Alert Behavior During Therapy: WFL for tasks assessed/performed Cognition: Cognition impaired       Memory impairment (select all impairments): Short-term memory, Working memory Attention impairment (select first level of impairment): Selective attention Executive functioning impairment (select all impairments): Sequencing, Reasoning, Problem solving                   Following commands: Impaired Following commands impaired: Follows one step commands with increased  time      Cueing   Cueing Techniques: Verbal cues, Tactile cues  Exercises Other Exercises Other Exercises: edu re: role of OT, role of rehab, discharge recommendations    Shoulder Instructions       General Comments vss throughout    Pertinent Vitals/ Pain       Pain Assessment Pain Assessment: No/denies  pain  Home Living                                          Prior Functioning/Environment              Frequency  Min 2X/week        Progress Toward Goals  OT Goals(current goals can now be found in the care plan section)  Progress towards OT goals: Progressing toward goals  Acute Rehab OT Goals Time For Goal Achievement: 07/25/24  Plan      Co-evaluation                 AM-PAC OT 6 Clicks Daily Activity     Outcome Measure   Help from another person eating meals?: A Little Help from another person taking care of personal grooming?: A Little Help from another person toileting, which includes using toliet, bedpan, or urinal?: A Little Help from another person bathing (including washing, rinsing, drying)?: A Little Help from another person to put on and taking off regular upper body clothing?: A Little Help from another person to put on and taking off regular lower body clothing?: A Little 6 Click Score: 18    End of Session Equipment Utilized During Treatment: Rolling walker (2 wheels)  OT Visit Diagnosis: Unsteadiness on feet (R26.81);Muscle weakness (generalized) (M62.81)   Activity Tolerance Patient tolerated treatment well   Patient Left in chair;with call bell/phone within reach;with nursing/sitter in room   Nurse Communication Mobility status        Time: 8976-8965 OT Time Calculation (min): 11 min  Charges: OT General Charges $OT Visit: 1 Visit OT Treatments $Therapeutic Activity: 8-22 mins  Therisa Sheffield, OTD OTR/L  07/13/24, 11:29 AM

## 2024-07-13 NOTE — Discharge Summary (Signed)
 DISCHARGE SUMMARY    Anthony Evans FMW:[id number] DOB: [birth year] DOA: 07/10/2024  PCP: Sadie Manna, MD  Admit date: 07/10/2024 Discharge date: 07/13/2024   Recommendations for Outpatient Follow-up:  Follow up with PCP in 1-2  weeks chronic medication management. Neurology follow-up as needed   Hospital Course: Anthony Evans is a 73 year old male with history of in utero stroke with chronic right hemiparesis, hypertension, hypothyroidism, lumbar degenerative disc disease, prior cervical fusion at C4-C5, BPH, remote history of seizures on carbamazepine  and gabapentin , who presents with frequent falls, unsteady gait,, left-sided tingling and leaning to the left.  He also had a presyncopal episode while he was walking to his car.  In the ED he was mildly tachycardic but otherwise had normal vital signs.  Brain MRI negative for acute stroke and showed unchanged ventricular enlargement from prior.  Given significant gait disturbance patient was admitted for further workup.  Neurology was consulted. Workup was mostly unremarkable.  Foraminal stenosis noted at the cervical spine.  Patient will be discharging directly to skilled nursing facility for continued PT/OT to work on gait training and balance.    Gait disturbance - Brain MRI unremarkable - MRI C-spine with severe foraminal stenosis and evidence of prior ACDF.  Can consider MRI of lumbar and thoracic if symptoms are persistent, though the patient does report he is not interested in further neurosurgery's even if given an option. - Continue with PT/OT.  Currently recommending rehab.  TOC consulted to assist in this arrangement   Syncopal episode - TTE without thrombus or vegetation.  LVEF 60 to 65%, grade 1 diastolic dysfunction - Has been on telemetry, no events - Orthostatics negative   Microcytic anemia B12 deficiency Thalassemia - Iron studies within normal limits.  On B12 supplementation at home.  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  Confirmatory testing is consistently nonreactive   Hypokalemia - Continue to replace as needed   In utero stroke - Chronic right hemiparesis.   Hypertension - Resuming home meds gradually.  Continue to titrate as needed.  Follow closely at SNF   Hypothyroidism - Continue Synthroid    Lumbar degenerative disc disease Prior cervical spine fusion at C4-C5 Foraminal stenosis of cervical spine - Continue PT/OT.  Patient is not interested in any further neurosurgical interventions.   Discharge Instructions  Discharge Instructions     Call MD for:  difficulty breathing, headache or visual disturbances   Complete by: As directed    Call MD for:  persistant dizziness or light-headedness   Complete by: As directed    Call MD for:  persistant nausea and vomiting   Complete by: As directed    Call MD for:  severe uncontrolled pain   Complete by: As directed    Call MD for:  temperature >100.4   Complete by: As directed    Diet general   Complete by: As directed    Discharge instructions   Complete by: As directed    While admitted we made some changes to your blood pressure medications. Please review your discharge medications closely to ensure you are taking the correct meds at home. Please take your blood pressure once a day and keep a log. See your primary care doctor in one week to review this log and make further medication changes.   Increase activity slowly   Complete  by: As directed       Allergies as of 07/13/2024       Reactions   Etanercept Nausea And Vomiting   Etodolac Nausea And Vomiting   Rubbing Alcohol [isopropyl Alcohol] Rash        Medication List     PAUSE taking these medications    triamterene -hydrochlorothiazide  75-50 MG tablet Wait to take this until your  doctor or other care provider tells you to start again. Commonly known as: MAXZIDE  Take 1 tablet by mouth daily.       STOP taking these medications    HYDROcodone -acetaminophen  5-325 MG tablet Commonly known as: NORCO/VICODIN   meloxicam  7.5 MG tablet Commonly known as: MOBIC    primidone  250 MG tablet Commonly known as: MYSOLINE        TAKE these medications    amLODipine 5 MG tablet Commonly known as: NORVASC Take 5 mg by mouth daily.   aspirin  EC 81 MG tablet Take 81 mg by mouth daily. Swallow whole.   calcium carbonate 1500 (600 Ca) MG Tabs tablet Commonly known as: OSCAL Take 1,500 mg by mouth 2 (two) times daily with a meal. Pt takes 1 tablet qam and 2 tablets qpm   carbamazepine  200 MG tablet Commonly known as: TEGRETOL  Take 400 mg by mouth 3 (three) times daily.   Centrum Silver 50+Women Tabs Take 1 tablet by mouth daily.   cetirizine 10 MG tablet Commonly known as: ZYRTEC Take 10 mg by mouth at bedtime.   clobetasol cream 0.05 % Commonly known as: TEMOVATE Apply 1 application  topically 2 (two) times daily as needed (psoriasis).   docusate sodium  100 MG capsule Commonly known as: COLACE Take 100 mg by mouth daily as needed for mild constipation.   fenofibrate 48 MG tablet Commonly known as: TRICOR Take 48 mg by mouth daily.   folic acid  1 MG tablet Commonly known as: FOLVITE  Take 1 mg by mouth daily.   gabapentin  300 MG capsule Commonly known as: NEURONTIN  Take 300-900 mg by mouth 3 (three) times daily. Pt takes 300mg  qam, 600mg  in afternoon, 900mg  at bedtime   levothyroxine  175 MCG tablet Commonly known as: SYNTHROID  Take 175 mcg by mouth daily.   meclizine  25 MG tablet Commonly known as: ANTIVERT  Take 1 tablet (25 mg total) by mouth 3 (three) times daily as needed for dizziness.   RHEUMATREX PO Take 2.5 mg by mouth every 7 (seven) days. TAKE 7 TABLETS (17.5 mg)  EVERY 7 DAYS on Wednesdays   Rolaids 550-110 MG Chew Generic drug: Ca  Carbonate-Mag Hydroxide Chew 1 tablet by mouth as needed (indigestion).   tamsulosin  0.4 MG Caps capsule Commonly known as: FLOMAX  Take 1 capsule (0.4 mg total) by mouth daily.   timolol  0.5 % ophthalmic solution Commonly known as: TIMOPTIC  Place 1 drop into both eyes daily.   triamcinolone cream 0.1 % Commonly known as: KENALOG Apply 1 application  topically 2 (two) times daily as needed.   Vitamin B 12 100 MCG Lozg Take 100 mcg by mouth daily.   vitamin C  1000 MG tablet Take 1,000 mg by mouth daily.   vitamin E 180 MG (400 UNITS) capsule Take 400 Units by mouth daily.   Wynzora  0.005-0.064 % Crea Generic drug: Calcipotriene-Betameth Diprop Apply to hands 5 nights a week at bedtime        Contact information for after-discharge care     Destination     [facility]   Service: Skilled Nursing Contact information:  [address] [place]  [address] [phone]                    Allergies  Allergen Reactions   Etanercept Nausea And Vomiting   Etodolac Nausea And Vomiting   Rubbing Alcohol [Isopropyl Alcohol] Rash    Consultations:    Procedures/Studies: MR CERVICAL SPINE WO CONTRAST Result Date: 07/12/2024 CLINICAL DATA:  Cervical radiculopathy, no red flags EXAM: MRI CERVICAL SPINE WITHOUT CONTRAST TECHNIQUE: Multiplanar, multisequence MR imaging of the cervical spine was performed. No intravenous contrast was administered. COMPARISON:  MRI cervical spine May 12, 2009 FINDINGS: Alignment: No substantial sagittal subluxation. Vertebrae: C3-C6 ACDF. No evidence of acute fracture or discitis/osteomyelitis. No suspicious bone lesion. Cord: Chronic small focus of T2 hyperintensity in the cord at C5-C6, compatible with myelomalacia. Posterior Fossa, vertebral arteries, paraspinal tissues: Unremarkable. Disc levels: C2-C3: Right greater than left facet and uncovertebral hypertrophy resulting severe right foraminal stenosis,  similar to priorl. Patent canal and left foramen C3-C4: ACDF. Uncovertebral hypertrophy with improved moderate right foraminal stenosis. Pain canal and left foramen. C4-C5: ACDF. Bilateral uncovertebral hypertrophy with similar moderate right and mild left foraminal stenosis. Patent canal. C5-C6: ACDF. Bilateral uncovertebral hypertrophy. Similar moderate right foraminal stenosis. Patent canal and left foramen. C6-C7: Bilateral facet uncovertebral hypertrophy, left greater than right. Progressive moderate to severe left foraminal stenosis. Right foramen is patent. Patent canal. C7-T1: Right greater than left facet and uncovertebral hypertrophy. Resulting progressive moderate right foraminal stenosis. IMPRESSION: 1. At C2-C3, similar severe right foraminal stenosis. 2. At C6-C7, progressive moderate to severe left foraminal stenosis. 3. At C7-T1, progressive moderate right foraminal stenosis. 4. Prior C3-C6 ACDF without progressive or high-grade stenosis at these levels. 5. 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 Electronically Signed   By: Gilmore GORMAN Molt M.D.   On: 07/12/2024 03:57   ECHOCARDIOGRAM COMPLETE Result Date: 07/11/2024    ECHOCARDIOGRAM REPORT   Patient Name:   Anthony Evans Date of Exam: 07/11/2024 Medical Rec #:  [id number]      Height:       70.0 in Accession #:    [id number]     Weight:       225.0 lb Date of Birth:  Sep 28, 1951      BSA:          2.194 m Patient Age:    72 years       BP:           147/76 mmHg Patient Gender: M              HR:           86 bpm. Exam Location:  [facility] Procedure: 2D Echo, Cardiac Doppler, Color Doppler and Saline Contrast Bubble            Study (Both Spectral and Color Flow Doppler were utilized during            procedure). Indications:     Stroke I63.9  History:         Patient has prior history of Echocardiogram examinations, most                  recent 11/05/2019. Stroke; Risk Factors:Hypertension.  Sonographer:     Christopher Furnace Referring Phys:  [id number] DELAYNE LULLA SOLIAN  Diagnosing Phys: Evalene Lunger MD IMPRESSIONS  1. Left ventricular ejection fraction, by estimation, is 60 to 65%. The left ventricle has normal function. The left ventricle has no regional wall motion abnormalities. Left ventricular diastolic parameters  are consistent with Grade I diastolic dysfunction (impaired relaxation).  2. Right ventricular systolic function is normal. The right ventricular size is normal. There is normal pulmonary artery systolic pressure. The estimated right ventricular systolic pressure is 21.6 mmHg.  3. The mitral valve is normal in structure. No evidence of mitral valve regurgitation. No evidence of mitral stenosis. Moderate mitral annular calcification.  4. The aortic valve is calcified. There is severe calcifcation of the aortic valve. Aortic valve regurgitation is not visualized. Moderate aortic valve stenosis. Aortic valve area, by VTI measures 1.69 cm. Aortic valve mean gradient measures 20.0 mmHg.  Aortic valve Vmax measures 3.05 m/s.  5. The inferior vena cava is normal in size with greater than 50% respiratory variability, suggesting right atrial pressure of 3 mmHg.  6. Agitated saline contrast bubble study was negative, with no evidence of any interatrial shunt. FINDINGS  Left Ventricle: Left ventricular ejection fraction, by estimation, is 60 to 65%. The left ventricle has normal function. The left ventricle has no regional wall motion abnormalities. Strain was performed and the global longitudinal strain is indeterminate. The left ventricular internal cavity size was normal in size. There is no left ventricular hypertrophy. Left ventricular diastolic parameters are consistent with Grade I diastolic dysfunction (impaired relaxation). Right Ventricle: The right ventricular size is normal. No increase in right ventricular wall thickness. Right ventricular systolic function is normal. There is normal pulmonary artery systolic pressure. The tricuspid regurgitant velocity is 2.04  m/s, and  with an assumed right atrial pressure of 5 mmHg, the estimated right ventricular systolic pressure is 21.6 mmHg. Left Atrium: Left atrial size was normal in size. Right Atrium: Right atrial size was normal in size. Pericardium: There is no evidence of pericardial effusion. Mitral Valve: The mitral valve is normal in structure. Moderate mitral annular calcification. No evidence of mitral valve regurgitation. No evidence of mitral valve stenosis. MV peak gradient, 7.7 mmHg. The mean mitral valve gradient is 4.0 mmHg. Tricuspid Valve: The tricuspid valve is normal in structure. Tricuspid valve regurgitation is not demonstrated. No evidence of tricuspid stenosis. Aortic Valve: The aortic valve is calcified. There is severe calcifcation of the aortic valve. Aortic valve regurgitation is not visualized. Moderate aortic stenosis is present. Aortic valve mean gradient measures 20.0 mmHg. Aortic valve peak gradient measures 37.3 mmHg. Aortic valve area, by VTI measures 1.69 cm. Pulmonic Valve: The pulmonic valve was normal in structure. Pulmonic valve regurgitation is not visualized. No evidence of pulmonic stenosis. Aorta: The aortic root is normal in size and structure. Venous: The inferior vena cava is normal in size with greater than 50% respiratory variability, suggesting right atrial pressure of 3 mmHg. IAS/Shunts: No atrial level shunt detected by color flow Doppler. Agitated saline contrast was given intravenously to evaluate for intracardiac shunting. Agitated saline contrast bubble study was negative, with no evidence of any interatrial shunt. There  is no evidence of a patent foramen ovale. There is no evidence of an atrial septal defect. Additional Comments: 3D was performed not requiring image post processing on an independent workstation and was indeterminate.  LEFT VENTRICLE PLAX 2D LVIDd:         4.40 cm   Diastology LVIDs:         2.70 cm   LV e' medial:    5.22 cm/s LV PW:         1.40 cm   LV  E/e' medial:  19.0 LV IVS:        1.60 cm  LV e' lateral:   5.98 cm/s LVOT diam:     2.30 cm   LV E/e' lateral: 16.6 LV SV:         80 LV SV Index:   36 LVOT Area:     4.15 cm  RIGHT VENTRICLE RV Basal diam:  5.00 cm RV Mid diam:    3.80 cm LEFT ATRIUM             Index        RIGHT ATRIUM          Index LA diam:        2.30 cm 1.05 cm/m   RA Area:     9.74 cm LA Vol (A2C):   49.2 ml 22.42 ml/m  RA Volume:   22.50 ml 10.25 ml/m LA Vol (A4C):   26.9 ml 12.26 ml/m LA Biplane Vol: 37.0 ml 16.86 ml/m  AORTIC VALVE AV Area (Vmax):    1.29 cm AV Area (Vmean):   1.21 cm AV Area (VTI):     1.69 cm AV Vmax:           305.33 cm/s AV Vmean:          206.500 cm/s AV VTI:            0.472 m AV Peak Grad:      37.3 mmHg AV Mean Grad:      20.0 mmHg LVOT Vmax:         95.00 cm/s LVOT Vmean:        60.100 cm/s LVOT VTI:          0.192 m LVOT/AV VTI ratio: 0.41  AORTA Ao Root diam: 3.30 cm MITRAL VALVE                TRICUSPID VALVE MV Area (PHT): 4.29 cm     TR Peak grad:   16.6 mmHg MV Area VTI:   2.61 cm     TR Vmax:        204.00 cm/s MV Peak grad:  7.7 mmHg MV Mean grad:  4.0 mmHg     SHUNTS MV Vmax:       1.39 m/s     Systemic VTI:  0.19 m MV Vmean:      92.1 cm/s    Systemic Diam: 2.30 cm MV Decel Time: 177 msec MV E velocity: 99.00 cm/s MV A velocity: 138.00 cm/s MV E/A ratio:  0.72 Evalene Lunger MD Electronically signed by Evalene Lunger MD Signature Date/Time: 07/11/2024/6:59:57 PM    Final    MR BRAIN WO CONTRAST Result Date: 07/11/2024 CLINICAL DATA:  ataxia, ?L-sided weakness EXAM: MRI HEAD WITHOUT CONTRAST TECHNIQUE: Multiplanar, multiecho pulse sequences of the brain and surrounding structures were obtained without intravenous contrast. COMPARISON:  MRI head November 05, 2019. FINDINGS: Brain: No acute infarction, hemorrhage, hydrocephalus, extra-axial collection or mass lesion. Left cerebral hemisphere volume loss with ex vacuo ventricular enlargement, unchanged. Vascular: Normal flow voids. Skull and  upper cervical spine: Normal marrow signal. Sinuses/Orbits: Negative. IMPRESSION: 1. No evidence of acute intracranial abnormality. 2. Left cerebral hemisphere volume loss with ex vacuo ventricular enlargement, unchanged. Electronically Signed   By: Gilmore GORMAN Molt M.D.   On: 07/11/2024 02:29   CT HEAD WO CONTRAST Result Date: 07/10/2024 EXAM: CT HEAD WITHOUT CONTRAST 07/10/2024 02:56:38 PM TECHNIQUE: CT of the head was performed without the administration of intravenous contrast. Automated exposure control, iterative reconstruction, and/or weight based adjustment of the mA/kV was utilized to reduce the radiation  dose to as low as reasonably achievable. COMPARISON: CT of the head dated 12/22/2021. CLINICAL HISTORY: Neuro deficit, acute, stroke suspected. Pt c/o increasing weakness in L arm and L leg x2-3 weeks. Denies pain. Pt reports Hx of prenatal stroke w/ deficit in R hand. FINDINGS: BRAIN AND VENTRICLES: No acute hemorrhage. Gray-white differentiation is preserved. There is pronounced left cerebral and moderate left cerebellar volume loss with marked chronic enlargement of the left lateral ventricle. The right ventricle is also moderately prominent. There is mild-to-moderate periventricular white matter disease. ORBITS: The patient is status post bilateral lens replacement and scleral banding. SINUSES: There is mild mucosal disease within the maxillary sinuses. SOFT TISSUES AND SKULL: No acute soft tissue abnormality. No skull fracture. Calcifications are noted within the carotid siphons bilaterally. IMPRESSION: 1. No acute intracranial abnormality. 2. Pronounced left cerebral and moderate left cerebellar volume loss with marked chronic enlargement of the left lateral ventricle and moderately prominent right ventricle. 3. Mild-to-moderate periventricular white matter disease. Electronically signed by: evalene coho 07/10/2024 03:01 PM EDT RP Workstation: [id number]      Discharge Exam: Vitals:    07/13/24 0325 07/13/24 0806  BP: (!) 177/85 (!) 149/73  Pulse: 93 87  Resp: 18 20  Temp: 98.5 F (36.9 C) 98.7 F (37.1 C)  SpO2: 97% 97%   Vitals:   07/12/24 1728 07/12/24 2000 07/13/24 0325 07/13/24 0806  BP: 137/75 (!) 151/84 (!) 177/85 (!) 149/73  Pulse: 88 84 93 87  Resp: 18  18 20   Temp: 99.2 F (37.3 C)  98.5 F (36.9 C) 98.7 F (37.1 C)  TempSrc:   Oral Oral  SpO2: 96%  97% 97%  Weight:      Height:        Constitutional:  Normal appearance. Non toxic-appearing.  HENT: Head Normocephalic and atraumatic.  Mucous membranes are moist.  Eyes:  Extraocular intact. Conjunctivae normal.  Cardiovascular: Rate and Rhythm: Normal rate and regular rhythm.  Pulmonary: Non labored, symmetric rise of chest wall.  Skin: warm and dry. not jaundiced.  Neurological: No focal deficit present. alert. Oriented.  Psychiatric: Mood and Affect congruent.    The results of significant diagnostics from this hospitalization (including imaging, microbiology, ancillary and laboratory) are listed below for reference.     Microbiology: No results found for this or any previous visit (from the past 240 hours).   Labs: BNP (last 3 results) No results for input(s): BNP in the last 8760 hours. Basic Metabolic Panel: Recent Labs  Lab 07/10/24 1427 07/11/24 1316 07/12/24 0509 07/13/24 0357  NA 141 136 137 139  K 3.7 3.5 3.4* 3.4*  CL 98 100 100 103  CO2 31 26 27 27   GLUCOSE 136* 108* 100* 96  BUN 30* 25* 21 19  CREATININE 1.40* 0.99 1.00 0.94  CALCIUM 10.6* 9.3 9.2 8.8*  MG  --   --  1.8 1.7  PHOS  --   --  3.1 2.5   Liver Function Tests: Recent Labs  Lab 07/10/24 1427 07/11/24 1316 07/12/24 0509 07/13/24 0357  AST 28 25 25 22   ALT 18 18 17 16   ALKPHOS 57 58 53 53  BILITOT 0.5 1.0 1.0 0.7  PROT 7.2 7.3 6.9 6.6  ALBUMIN 4.0 4.2 4.2 3.7   No results for input(s): LIPASE, AMYLASE in the last 168 hours. No results for input(s): AMMONIA in the last 168  hours. CBC: Recent Labs  Lab 07/10/24 1427 07/11/24 1316 07/12/24 0509 07/13/24 0357  WBC 6.8 7.2 8.0  7.1  NEUTROABS 5.2 5.1 4.9 4.7  HGB 9.0* 8.9* 9.3* 8.1*  HCT 29.3* 29.4* 31.2* 26.7*  MCV 71.3* 70.8* 73.1* 71.4*  PLT 289 306 314 290   Cardiac Enzymes: No results for input(s): CKTOTAL, CKMB, CKMBINDEX, TROPONINI in the last 168 hours. BNP: Invalid input(s): POCBNP CBG: No results for input(s): GLUCAP in the last 168 hours. D-Dimer No results for input(s): DDIMER in the last 72 hours. Hgb A1c Recent Labs    07/11/24 0409  HGBA1C 5.2   Lipid Profile Recent Labs    07/11/24 0409  CHOL 168  HDL 39*  LDLCALC 99  TRIG 850  CHOLHDL 4.3   Thyroid function studies Recent Labs    07/11/24 1033  TSH 0.986   Anemia work up Recent Labs    07/11/24 1033 07/12/24 0509  VITAMINB12 266  --   FERRITIN  --  216  TIBC  --  272  IRON  --  84  RETICCTPCT  --  2.4   Urinalysis    Component Value Date/Time   COLORURINE YELLOW (A) 07/10/2024 1614   APPEARANCEUR HAZY (A) 07/10/2024 1614   LABSPEC 1.015 07/10/2024 1614   PHURINE 6.0 07/10/2024 1614   GLUCOSEU NEGATIVE 07/10/2024 1614   HGBUR NEGATIVE 07/10/2024 1614   BILIRUBINUR NEGATIVE 07/10/2024 1614   KETONESUR NEGATIVE 07/10/2024 1614   PROTEINUR NEGATIVE 07/10/2024 1614   NITRITE NEGATIVE 07/10/2024 1614   LEUKOCYTESUR NEGATIVE 07/10/2024 1614   Sepsis Labs Recent Labs  Lab 07/10/24 1427 07/11/24 1316 07/12/24 0509 07/13/24 0357  WBC 6.8 7.2 8.0 7.1   Microbiology No results found for this or any previous visit (from the past 240 hours).   Time coordinating discharge: 32 min   SIGNED: Kupono Marling, DO [facility] 07/13/2024, 11:22 AM Pager   If 7PM-7AM, please contact night-coverage

## 2024-08-13 ENCOUNTER — Ambulatory Visit (INDEPENDENT_AMBULATORY_CARE_PROVIDER_SITE_OTHER): Admitting: Podiatry

## 2024-08-13 DIAGNOSIS — Z91198 Patient's noncompliance with other medical treatment and regimen for other reason: Secondary | ICD-10-CM

## 2024-08-14 NOTE — Progress Notes (Signed)
 1. Failure to attend appointment with reason given    Appointment rescheduled by patient.

## 2024-08-17 ENCOUNTER — Encounter: Payer: Self-pay | Admitting: Gastroenterology

## 2024-08-20 ENCOUNTER — Ambulatory Visit: Admitting: Podiatry

## 2024-08-20 ENCOUNTER — Encounter: Payer: Self-pay | Admitting: Podiatry

## 2024-08-20 DIAGNOSIS — M79676 Pain in unspecified toe(s): Secondary | ICD-10-CM | POA: Diagnosis not present

## 2024-08-20 DIAGNOSIS — B351 Tinea unguium: Secondary | ICD-10-CM | POA: Diagnosis not present

## 2024-08-23 ENCOUNTER — Encounter: Payer: Self-pay | Admitting: Podiatry

## 2024-08-23 NOTE — Progress Notes (Signed)
  Subjective:  Patient ID: Anthony Evans, male    DOB: 26-Jul-1951,  MRN: [id number]  Anthony Evans presents to clinic today for preventative diabetic foot care for painful thick toenails that are difficult to trim. Pain interferes with ambulation. Aggravating factors include wearing enclosed shoe gear. Pain is relieved with periodic professional debridement.  Chief Complaint  Patient presents with   rfc    Rm3 Routine foot care/ Dr. Tamra Leventhal Last visit September 4.2025   New problem(s): None.   PCP is Leventhal Tamra, MD.  Allergies  Allergen Reactions   Etanercept Nausea And Vomiting   Etodolac Nausea And Vomiting   Rubbing Alcohol [Isopropyl Alcohol] Rash    Review of Systems: Negative except as noted in the HPI.  Objective: No changes noted in today's physical examination. There were no vitals filed for this visit. Anthony Evans is a pleasant 73 y.o. male obese in NAD. AAO x 3.  Vascular Examination: Vascular status intact b/l with palpable pedal pulses. CFT immediate b/l. Pedal hair present. No edema. No pain with calf compression b/l. Skin temperature gradient WNL b/l. No varicosities noted. No cyanosis or clubbing noted.  Neurological Examination: Sensation grossly intact b/l with 10 gram monofilament. Vibratory sensation intact b/l.  Dermatological Examination: Pedal skin with normal turgor and tone b/l. No open wounds nor interdigital macerations noted. Toenails 1-5 b/l thick, discolored, elongated with subungual debris and pain on dorsal palpation. No hyperkeratotic lesions noted b/l.   Diffuse scaling noted peripherally and plantarly b/l feet.  No interdigital macerations.  No blisters, no weeping. No signs of secondary bacterial infection noted.  Musculoskeletal Examination: Muscle strength 5/5 to b/l LE.  No pain, crepitus noted b/l. No gross pedal deformities. Patient ambulates independently without assistive aids.   Radiographs:  None  Assessment/Plan: 1. Pain due to onychomycosis of toenail   -Patient was evaluated today. All questions/concerns addressed on today's visit. -Consent given for treatment as described below: -Patient to continue soft, supportive shoe gear daily. -Mycotic toenails 1-5 bilaterally were debrided in length and girth with sterile nail nippers and dremel. Pinpoint bleeding of left great toe addressed with Lumicain Hemostatic Solution, cleansed with alcohol. No further treatment required by patient/caregiver. -Patient/POA to call should there be question/concern in the interim.   Return in about 3 months (around 11/19/2024).  Delon LITTIE Merlin, DPM      [facility] LOCATION: [address] [place] [address]                   Office [phone]   [facility] LOCATION: [address] [place], [place] [address] Office [phone]

## 2024-10-11 ENCOUNTER — Encounter: Payer: Self-pay | Admitting: Gastroenterology

## 2024-10-22 ENCOUNTER — Ambulatory Visit: Admission: RE | Admit: 2024-10-22 | Source: Home / Self Care | Admitting: Gastroenterology

## 2024-10-22 HISTORY — DX: Personal history of other malignant neoplasm of skin: Z85.828

## 2024-10-22 HISTORY — DX: Unspecified osteoarthritis, unspecified site: M19.90

## 2024-10-22 SURGERY — COLONOSCOPY
Anesthesia: General

## 2024-10-23 MED ORDER — PENTAFLUOROPROP-TETRAFLUOROETH EX AERO
INHALATION_SPRAY | CUTANEOUS | Status: AC
  Filled 2024-10-23: qty 30

## 2024-10-25 MED ORDER — CEFAZOLIN SODIUM-DEXTROSE 2-4 GM/100ML-% IV SOLN
INTRAVENOUS | Status: AC
  Filled 2024-10-25: qty 100

## 2024-10-25 MED ORDER — ALBUTEROL SULFATE (2.5 MG/3ML) 0.083% IN NEBU
INHALATION_SOLUTION | RESPIRATORY_TRACT | Status: AC
  Filled 2024-10-25: qty 3

## 2024-10-25 MED ORDER — IPRATROPIUM-ALBUTEROL 0.5-2.5 (3) MG/3ML IN SOLN
RESPIRATORY_TRACT | Status: AC
  Filled 2024-10-25: qty 3

## 2024-11-19 ENCOUNTER — Ambulatory Visit: Admitting: Podiatry
